# Patient Record
Sex: Female | Born: 1935 | Race: White | Hispanic: No | Smoking: Former smoker
Health system: Southern US, Community
[De-identification: ages and names within clinical notes are randomized; demographics above are authoritative.]

## PROBLEM LIST (undated history)

## (undated) DIAGNOSIS — M1990 Unspecified osteoarthritis, unspecified site: Secondary | ICD-10-CM

## (undated) DIAGNOSIS — E785 Hyperlipidemia, unspecified: Secondary | ICD-10-CM

## (undated) DIAGNOSIS — I10 Essential (primary) hypertension: Secondary | ICD-10-CM

## (undated) DIAGNOSIS — E0500 Thyrotoxicosis with diffuse goiter without thyrotoxic crisis or storm: Secondary | ICD-10-CM

## (undated) DIAGNOSIS — H409 Unspecified glaucoma: Secondary | ICD-10-CM

## (undated) HISTORY — PX: CERVICAL FUSION: SHX112

## (undated) HISTORY — DX: Unspecified glaucoma: H40.9

## (undated) HISTORY — DX: Hyperlipidemia, unspecified: E78.5

## (undated) HISTORY — PX: TUBAL LIGATION: SHX77

## (undated) HISTORY — DX: Essential (primary) hypertension: I10

---

## 2002-08-22 ENCOUNTER — Encounter: Admission: RE | Admit: 2002-08-22 | Discharge: 2002-09-12 | Payer: Self-pay | Admitting: Orthopaedic Surgery

## 2002-11-01 ENCOUNTER — Encounter: Admission: RE | Admit: 2002-11-01 | Discharge: 2002-11-01 | Payer: Self-pay | Admitting: Orthopaedic Surgery

## 2002-12-13 ENCOUNTER — Inpatient Hospital Stay (HOSPITAL_COMMUNITY): Admission: EM | Admit: 2002-12-13 | Discharge: 2002-12-14 | Payer: Self-pay | Admitting: Orthopaedic Surgery

## 2002-12-28 ENCOUNTER — Emergency Department (HOSPITAL_COMMUNITY): Admission: EM | Admit: 2002-12-28 | Discharge: 2002-12-28 | Payer: Self-pay | Admitting: Emergency Medicine

## 2002-12-28 ENCOUNTER — Encounter: Payer: Self-pay | Admitting: Emergency Medicine

## 2003-06-01 ENCOUNTER — Observation Stay (HOSPITAL_COMMUNITY): Admission: EM | Admit: 2003-06-01 | Discharge: 2003-06-02 | Payer: Self-pay

## 2003-06-15 ENCOUNTER — Ambulatory Visit (HOSPITAL_COMMUNITY): Admission: RE | Admit: 2003-06-15 | Discharge: 2003-06-15 | Payer: Self-pay | Admitting: Unknown Physician Specialty

## 2003-06-15 ENCOUNTER — Encounter: Payer: Self-pay | Admitting: Unknown Physician Specialty

## 2003-08-03 ENCOUNTER — Ambulatory Visit (HOSPITAL_COMMUNITY): Admission: RE | Admit: 2003-08-03 | Discharge: 2003-08-03 | Payer: Self-pay | Admitting: Gastroenterology

## 2004-04-24 ENCOUNTER — Encounter
Admission: RE | Admit: 2004-04-24 | Discharge: 2004-07-09 | Payer: Self-pay | Admitting: Physical Medicine and Rehabilitation

## 2006-03-10 ENCOUNTER — Encounter: Admission: RE | Admit: 2006-03-10 | Discharge: 2006-03-22 | Payer: Self-pay | Admitting: Orthopedic Surgery

## 2006-05-27 ENCOUNTER — Observation Stay (HOSPITAL_COMMUNITY): Admission: EM | Admit: 2006-05-27 | Discharge: 2006-05-28 | Payer: Self-pay | Admitting: Emergency Medicine

## 2006-05-31 ENCOUNTER — Encounter: Admission: RE | Admit: 2006-05-31 | Discharge: 2006-05-31 | Payer: Self-pay | Admitting: Neurological Surgery

## 2006-06-07 ENCOUNTER — Encounter: Admission: RE | Admit: 2006-06-07 | Discharge: 2006-06-07 | Payer: Self-pay | Admitting: Neurological Surgery

## 2006-06-17 ENCOUNTER — Inpatient Hospital Stay (HOSPITAL_COMMUNITY): Admission: RE | Admit: 2006-06-17 | Discharge: 2006-06-18 | Payer: Self-pay | Admitting: Neurological Surgery

## 2006-08-02 ENCOUNTER — Encounter: Admission: RE | Admit: 2006-08-02 | Discharge: 2006-08-02 | Payer: Self-pay | Admitting: Neurological Surgery

## 2008-04-16 ENCOUNTER — Encounter: Admission: RE | Admit: 2008-04-16 | Discharge: 2008-05-15 | Payer: Self-pay | Admitting: Orthopedic Surgery

## 2009-06-29 ENCOUNTER — Emergency Department (HOSPITAL_BASED_OUTPATIENT_CLINIC_OR_DEPARTMENT_OTHER): Admission: EM | Admit: 2009-06-29 | Discharge: 2009-06-29 | Payer: Self-pay | Admitting: Emergency Medicine

## 2009-11-19 ENCOUNTER — Emergency Department (HOSPITAL_COMMUNITY): Admission: EM | Admit: 2009-11-19 | Discharge: 2009-11-19 | Payer: Self-pay | Admitting: Emergency Medicine

## 2010-01-23 ENCOUNTER — Encounter: Admission: RE | Admit: 2010-01-23 | Discharge: 2010-04-23 | Payer: Self-pay | Admitting: Orthopaedic Surgery

## 2011-04-06 LAB — URINALYSIS, ROUTINE W REFLEX MICROSCOPIC
Bilirubin Urine: NEGATIVE
Nitrite: NEGATIVE
Protein, ur: NEGATIVE mg/dL
Urobilinogen, UA: 0.2 mg/dL (ref 0.0–1.0)
pH: 6 (ref 5.0–8.0)

## 2011-04-06 LAB — CBC
HCT: 41 % (ref 36.0–46.0)
MCHC: 34.7 g/dL (ref 30.0–36.0)
MCV: 89 fL (ref 78.0–100.0)
RDW: 12.2 % (ref 11.5–15.5)
WBC: 11.6 10*3/uL — ABNORMAL HIGH (ref 4.0–10.5)

## 2011-04-06 LAB — COMPREHENSIVE METABOLIC PANEL
ALT: 15 U/L (ref 0–35)
AST: 28 U/L (ref 0–37)
Alkaline Phosphatase: 83 U/L (ref 39–117)
CO2: 24 mEq/L (ref 19–32)
Calcium: 9.4 mg/dL (ref 8.4–10.5)
GFR calc Af Amer: >60 mL/min (ref >60)
GFR calc non Af Amer: >60 mL/min (ref >60)

## 2011-04-06 LAB — DIFFERENTIAL
Basophils Absolute: 0 10*3/uL (ref 0.0–0.1)
Basophils Relative: 0 % (ref 0–1)
Lymphs Abs: 1.8 10*3/uL (ref 0.7–4.0)
Neutro Abs: 8.9 10*3/uL — ABNORMAL HIGH (ref 1.7–7.7)

## 2011-04-06 LAB — URINE MICROSCOPIC-ADD ON

## 2011-04-06 LAB — POCT CARDIAC MARKERS
CKMB, poc: 2 ng/mL (ref 1.0–8.0)
Troponin i, poc: <0.05 ng/mL (ref 0.00–0.09)
Troponin i, poc: <0.05 ng/mL (ref 0.00–0.09)

## 2011-05-15 NOTE — Cardiovascular Report (Signed)
NAME:  Rita Wells, Rita Wells                        ACCOUNT NO.:  [id number]   MEDICAL RECORD NO.:  [id number]                   PATIENT TYPE:  INP   LOCATION:  6524                                 FACILITY:  [facility]   PHYSICIAN:  Mohan N. Terrence Dupont, M.D.              DATE OF BIRTH:  1933/06/16   DATE OF PROCEDURE:  06/01/2003  DATE OF DISCHARGE:  06/02/2003                              CARDIAC CATHETERIZATION   PROCEDURES PERFORMED:  1. Left cardiac catheterization.  2. Selective left and right coronary angiography.  3. Left ventriculography via right groin using Judkins technique.   CARDIOLOGISTAllegra Lai Terrence Dupont, M.D.   INDICATIONS FOR THE PROCEDURE:  Rita Wells is a 77 year old white female  with a past medical history significant for hypertension, history of tobacco  abuse, degenerative joint disease, and strong family history of coronary  artery disease.  She came to the ER complaining of retrosternal chest  pressure and tightness associated with left shoulder pain, diaphoresis,  shortness of breath and palpitations off and on.  The patient received two  sublingual nitro in the ER with relief of chest tightness.  Denies any  lightheadedness, syncope, nausea, PND, orthopnea, and leg swelling; also  gives history of exertional dyspnea, but denies exertional chest pain.  Denies cough or urgency.  Denies urinary complaints.  Denies relation of  chest pain to food or eating.  The patient was admitted to the telemetry  unit and was ruled out for myocardial infarction.  Admission EKG showed  normal sinus rhythm with poor R-wave progression in V1 to V3.  Repeat EKG  done showed sinus bradycardia with biphasic P waves in the inferolateral  leads.   I discussed with patient and her family regarding the various options of  treatment, i.e. noninvasive stress testing and Cath, as well as PTCA and  stenting and its risks, i.e. MI, stroke, need for emergency CABG, risks of  restenosis, local  vascular complications, etc.  Due to multiple risk factors  and minor EKG changes the family and patient consented for emergency  procedure.   DESCRIPTION OF PROCEDURE:  After obtaining the informed consent the patient  was brought to the cath lab and was placed on the fluoroscopy table.  The  right groin was prepped and draped in the usual fashion.  Two percent  Xylocaine was used for local anesthesia in the right groin.  With the help  of a thin-walled needle a 6 French arterial sheath was placed.  The sheath  was aspirated and flushed.  Next, a 2 French left Judkins catheter was  advanced over the wire under fluoroscopic guidance up to the ascending  aorta.  The wire was pulled out.  The catheter was aspirated and connected  to the manifold.  The catheter was further advanced and engaged into the  left coronary ostium.  Multiple views of the left system were taken.  Next,  the catheter  was disengaged and was pulled out over the wire and was  replaced with a 6 French right Judkins catheter, which was advanced over the  wire under fluoroscopic guidance up to the ascending aorta.  Wire was pulled  out, the catheter was aspirated and connected to the manifold.  The catheter  was further advanced and engaged into the right coronary ostium.  Multiple  views of the right system were taken.  Next, the catheter was disengaged and  was pulled out over the wire, and was replaced with a 6 French pigtail  catheter, which was advanced over the wire under fluoroscopic guidance up to  the ascending aorta.  The wire was pulled out, the catheter was aspirated  and connected to the manifold.  The catheter was further advanced across the  aortic valve into the LV.  LV pressures were recorded.  Next left  ventriculography was done in the 30-degree RAO position.  Post angiographic  pressures were recorded from the LV and then pullback pressures were  recorded from the aorta.  There was no gradient across the  aortic valve.  Next, the pigtail catheter was pulled out over the wire, and the sheaths  were aspirated and flushed.   FINDINGS:  LV showed good LV systolic function.  EF of 55-60%.   Left main was patent.   LAD has greater than 15% proximal and mid stenosis.  Diagonal-1 has 20%  ostial stenosis.  Diagonal-2 has 10-15% proximal stenosis.   Left circumflex has 10-20% proximal stenosis and then tapers down into the  AV groove after giving off a large OM-2.  OM-1 was very, very small, less  than 0.5 mm.  OM-2 was large, which was patent.   RCA has 10-20% proximal stenosis.   Arteriotomy was closed with Perclose without any complications.   The patient tolerated the procedure well and was transferred to the recovery  room in stable condition.                                                 Allegra Lai. Terrence Dupont, M.D.    MNH/MEDQ  D:  06/02/2003  T:  06/02/2003  Job:  [id number]   cc:   Catheterization Laboratory

## 2011-05-15 NOTE — Op Note (Signed)
NAMEVANISHA, SHOLL NO.:  [id number]   MEDICAL RECORD NO.:  [id number]          PATIENT TYPE:  INP   LOCATION:  3172                         FACILITY:  [facility]   PHYSICIAN:  Eustace Moore, MD     DATE OF BIRTH:  Oct 04, 1933   DATE OF PROCEDURE:  06/17/2006  DATE OF DISCHARGE:                                 OPERATIVE REPORT   PREOPERATIVE DIAGNOSIS:  Right chronic subdural hematoma with left arm  numbness.   POSTOPERATIVE DIAGNOSIS:  Right chronic subdural hematoma with left arm  numbness.   PROCEDURE:  Right parietal bur holes for evacuation of chronic subdural  hematoma.   SURGEON:  Eustace Moore, MD.   ASSISTANT:  Faythe Ghee, MD.   ANESTHESIA:  General endotracheal.   COMPLICATIONS:  None apparent.   INDICATIONS FOR PROCEDURE:  Ms. Fasel is a 77 year old female whom I saw  in the emergency department several weeks ago with left arm numbness.  She  had a CT scan which showed a right chronic subdural hematoma.  She had a  history of a previous fall about 3 months prior to that.  She had an MRI  during that admission which showed a moderate-sized right chronic subdural  hematoma with a very small left subdural hematoma which was not visible on  CT scanning.  She was placed on Dilantin because of her continued episodic  symptoms which potentially could have been from cortical irritation or  seizure activity.  She was discharged to home.  She was followed up in the  office for several weeks with serial head CTs, which showed no resolution of  the chronic subdural hematoma, and she had continued symptoms.  Therefore,  we recommended bur holes for evacuation of the subdural hematoma.  She  understood the risks, the benefits, and expected outcome of the procedure.  She also understood that this may not adequately drain the subdural  hematoma, requiring further surgery in the future in a staged fashion.   DESCRIPTION OF PROCEDURE:  The patient was  taken to the operating room, and  after induction of adequate generalized endotracheal anesthesia, she was  placed in a supine position on the operating room table.  Her right parietal  region was shaved in two small areas and then prepped with DuraPrep and then  draped in the usual sterile fashion; 10 mL of local anesthesia was injected,  and 2 small linear incisions were made in the right parietal region.  The  high-speed drill was used to drill two small bur holes, one at each  incision.  At the posterior bur hole, I was able to coagulate the dura, open  the dura, and found a large, thickened, chronic subdural hematoma membrane.  This was opened with bipolar cautery, and then this revealed a similar  membrane just under this.  This was opened, with release of a significant  amount of motor-oil-like fluid under significant pressure.  The brain  immediately came to the surface.  I was able to use a small irrigator and a  #3 Technical brewer to push the  brain away gently and irrigate around the  edges and get some more subdural hematoma fluid out.  However, the brain was  again full, and there was no room to place a subdural drain without risk of  it entering the cortical surface.  Therefore, I made a small bur hole in the  right frontoparietal region and opened the dura.  I found a small membrane  but no significant subdural hematoma fluid.  Once I had irrigated the  posterior bur hole with copious amounts of saline solution until the  irrigant was clear, I lined the 2 bur holes with Gelfoam and then closed the  galea with interrupted 2-0 Vicryl and  closed the skin with staples.  The drapes were removed.  Sterile dressings  were applied.  The patient was awakened from general anesthesia and  transferred to the recovery room in stable condition.  At the end of the  procedure, all sponge, needle, and instrument counts were correct.      Eustace Moore, MD  Electronically  Signed     DSJ/MEDQ  D:  06/17/2006  T:  06/17/2006  Job:  [id number]

## 2011-05-15 NOTE — H&P (Signed)
NAMEJENIAH, Rita Wells NO.:  [id number]   MEDICAL RECORD NO.:  [id number]          PATIENT TYPE:  OBV   LOCATION:  3019                         FACILITY:  [facility]   PHYSICIAN:  Eustace Moore, MD     DATE OF BIRTH:  February 17, 1933   DATE OF ADMISSION:  05/27/2006  DATE OF DISCHARGE:                                HISTORY & PHYSICAL   CHIEF COMPLAINT:  Left-sided numbness.   HISTORY OF PRESENT ILLNESS:  Ms. Follett is a 77 year old white female seen  in neurosurgical consultation regarding a right subacute subdural hematoma.  The patient states that she fell in March, striking the back of her head.  She had no loss of consciousness.  She did not see a physician regarding his  fall.  She states that over the last two days she has had some numbness  which comes and goes in her left arm, side and leg, without facial  involvement.  She denies any significant weakness, falls or change in gait.  She denies any seizure activity or any change in her sensorium.  She is  right-handed.  She denies any diplopia or dysphagia or visual changes.  She  came to the emergency department with these complaints, and a head CT  revealed a small right subacute subdural hematoma without significant mass  effect or shift, and neurosurgical evaluation was requested.   PAST MEDICAL HISTORY:  1.  A three-level ACDF with plating by Dr. Rodell Perna in 2003.  2.  Hyperlipidemia.  3.  Hypertension.   MEDICATIONS:  1.  Zocor.  2.  Altace.  3.  Atenolol.   ALLERGIES:  NO KNOWN DRUG ALLERGIES.   SOCIAL HISTORY:  Has a remote history of tobacco use.  No alcohol use.   PHYSICAL EXAMINATION:  VITAL SIGNS:  Temperature 98.3, pulse 55,  respirations 18, blood pressure 149/72.  GENERAL:  A pleasant, cooperative white female in no acute distress.  HEENT: Normocephalic, atraumatic.  Extraocular movements are intact.  Visual  fields are full to __________ confrontation.  No facial asymmetry.  Tongue  protrudes in midline.  NEUROLOGIC:  She has good shoulder shrug.  Her strength is 5/5 throughout  with good muscle tone and good muscle bulk.  There is no pronator drift.  Sensation is grossly intact throughout.  Reflexes are hypoactive.  Gait is  not tested.   IMAGING STUDIES:  CT scan of the cervical spine shows a three-level ACDF  with plating without significant complications.  She has a CT scan of the  brain which I have reviewed, as well as report, which shows a small right-  sided subacute subdural hematoma with some interhemispheric extension  anteriorly.  There seems to be a small focal hematoma superiorly near the  motor strip.  There seems to be no significant mass effect or shift.   ASSESSMENT/PLAN:  She has a small right subacute subdural hematoma which is  likely symptomatic and causing some brief left-sided numbness.  It does not  seem to be seizure-related, and at this point I do not feel that we should  start her on  prophylactic anticonvulsants.  I would like to get an MRI of  the brain to better characterize the subdural hematoma, its size and its  location specifically.  We will admit her for observation to the regular  floor and offer further treatment recommendations once the MRI is completed.  These things have been discussed with the patient and her daughter in  detail, and they have expressed understanding.      Eustace Moore, MD  Electronically Signed     DSJ/MEDQ  D:  05/27/2006  T:  05/27/2006  Job:  [id number]

## 2011-05-15 NOTE — Discharge Summary (Signed)
NAME:  Rita Wells, Rita Wells                        ACCOUNT NO.:  [id number]   MEDICAL RECORD NO.:  [id number]                   Rita Wells TYPE:  INP   LOCATION:  6524                                 FACILITY:  [facility]   PHYSICIAN:  Mohan N. Terrence Dupont, M.D.              DATE OF BIRTH:  1933-01-26   DATE OF ADMISSION:  06/01/2003  DATE OF DISCHARGE:  06/02/2003                                 DISCHARGE SUMMARY   ADMISSION DIAGNOSES:  1. New-onset angina, rule out myocardial infarction.  2. New-onset hypertension.  3. Elevated blood sugar, rule out diabetes mellitus.  4. Hypercholesterolemia.  5. History of tobacco use.  6. Positive family history of coronary artery disease.   DISCHARGE DIAGNOSES:  1. Mild coronary artery disease.  2. Ventricular bigeminy, asymptomatic.  3. Hypertension.  4. Hypercholesterolemia.  5. History of tobacco abuse.  6. Positive family history of coronary artery disease.  7. Status post marked sinus bradycardia secondary to beta blockers.  8. Musculoskeletal pain.   DISCHARGE MEDICATIONS:  1. Toprol XL 25 mg one tablet daily.  2. Enteric-coated aspirin 81 mg one tablet daily.  3. Lipitor 20 mg one tablet daily.  4. Protonix 40 mg one tablet daily.  5. Celebrex 200 mg one capsule daily with food as needed.   ACTIVITY:  Avoid heavy lifting, pushing, or pulling for 48 hours post  cardiac catheterization, and Perclose instructions have been given.   FOLLOW UP:  Follow up with me in one week.   CONDITION AT DISCHARGE:  Stable.   BRIEF HISTORY AND HOSPITAL COURSE:  Rita Wells is a 77 year old white  female with a past medical history significant for hypercholesterolemia,  history of tobacco abuse, degenerative joint disease, strong family history  of coronary artery disease.  Rita Wells came to the ER complaining of retrosternal  chest pressure/tightness associated with left shoulder pain, diaphoresis,  and shortness of breath and palpitations off and on.  Rita Wells  received two  sublingual nitro in the ER with relief of chest tightness.  Rita Wells denies  any lightheadedness or syncope.  No history of PND, orthopnea, leg swelling.  Also, there is a history of exertional dyspnea but denies exertional chest  pain.  Denies any cough, fever, or chills.  Denies any urinary complaints.  Denies relation of chest pain to food or breathing.   PAST MEDICAL HISTORY:  As above.   PAST SURGICAL HISTORY:  1. Rita Wells had a cervical fusion in December of 2003.  2. Had a tubal ligation in 1950s.   ALLERGIES:  No known drug allergies.   MEDICATIONS:  1. Rita Wells takes metoprolol 20 mg p.o. daily.  2. Ibuprofen p.r.n.   SOCIAL HISTORY:  Rita Wells is married and has seven children, smoked one-half pack  per day for 20+ years, quit three or four years ago.  No history of alcohol  abuse.   FAMILY HISTORY:  Father died of MI at the age  of 75.  Mother died of MI at  the age of 10.  One son had an MI at the age of 65.  One brother had an MI  at the age of 83.  Another brother had an MI in his 73s.  Two sisters have  had coronary artery bypass grafting, and one sister had ovarian cancer.   PHYSICAL EXAMINATION:  GENERAL:  Rita Wells is alert, awake, oriented times three,  in no acute distress.  VITAL SIGNS:  Blood pressure was 190/92, pulse was 85, regular.  HEENT:  Conjunctivae were pink.  NECK:  Supple.  No JVD.  No bruit.  LUNGS:  Clear to auscultation without rhonchi or rales.  CARDIOVASCULAR EXAM:  S1 and S2 were normal.  There was a soft systolic  murmur at the apex.  There was no rub.  ABDOMEN:  Soft.  Bowel sounds were present.  Nontender.  EXTREMITIES:  There was no clubbing, cyanosis, or edema.   LABORATORY DATA:  EKG showed normal sinus rhythm with poor R-wave  progression in V1 through V3.  Repeat EKG done showed sinus bradycardia with  biphasic T waves in inferolateral leads.  Her other labs, CK was 235, MB of  4.7, relative index 2.0; second set, CK was 173, MB of 3.1,  relative index  1.8.  Three sets of troponin-I were negative, 0.01, 0.01, and 0.01.  Her  sodium was 138, potassium 4.0, chloride 106, her blood sugar was 124, repeat  fasting blood sugar was 100.  Her hemoglobin A1c was 5.4, which was in  normal range.  Her BUN was 17, creatinine 1.1.  Hemoglobin was 13.4,  hematocrit 38.6, white count of 8.9 with no shift to the left.   BRIEF HOSPITAL COURSE:  Rita Wells was admitted to telemetry unit; MI was ruled  out by serial enzymes and EKG.  I discussed with Rita Wells and her daughter  regarding various options of treatment, i.e. noninvasive stress testing  versus invasive heart cath and possible PTCA, stenting, its risks, i.e.,  death, MI, stroke, need for emergency CABG, risk of restenosis, local  vascular complications, etc.  Due to minor biphasic T-wave changes in the  inferolateral leads as compared to prior EKG done earlier today and multiple  risk factors, Rita Wells consented for a PCI.   Rita Wells underwent left cardiac cath which led to left and right coronary  angiography, as per procedure report yesterday, which showed mild coronary  artery disease with good LV systolic function.  Rita Wells had persistent, mild  bradycardia with a heart rate of 35 to 40 range.  Beta blockers were  stopped, and it was monitored overnight.  Rita Wells does have sustained  ventricular bigeminy after stopping the beta blocker, but Rita Wells remained  asymptomatic.  Rita Wells will be restarted on low-dose beta blockers and will  be discharged home on the above medications.  If Rita Wells continues to have  palpitations, we will get event monitor as an outpatient.  Rita Wells also has  been advised to stay away from caffeine, chocolate, and alcohol.                                               Allegra Lai. Terrence Dupont, M.D.    MNH/MEDQ  D:  06/02/2003  T:  06/02/2003  Job:  [id number]

## 2011-08-20 ENCOUNTER — Other Ambulatory Visit (HOSPITAL_COMMUNITY): Payer: Self-pay | Admitting: Endocrinology

## 2011-08-20 DIAGNOSIS — E0590 Thyrotoxicosis, unspecified without thyrotoxic crisis or storm: Secondary | ICD-10-CM

## 2011-09-08 ENCOUNTER — Encounter (HOSPITAL_COMMUNITY)
Admission: RE | Admit: 2011-09-08 | Discharge: 2011-09-08 | Disposition: A | Discharge status: Home / Self Care | Payer: Medicare Other | Source: Ambulatory Visit | Attending: Endocrinology | Admitting: Endocrinology

## 2011-09-08 DIAGNOSIS — E0520 Thyrotoxicosis with toxic multinodular goiter without thyrotoxic crisis or storm: Secondary | ICD-10-CM | POA: Insufficient documentation

## 2011-09-08 DIAGNOSIS — E0590 Thyrotoxicosis, unspecified without thyrotoxic crisis or storm: Secondary | ICD-10-CM

## 2011-09-09 ENCOUNTER — Encounter (HOSPITAL_COMMUNITY)
Admission: RE | Admit: 2011-09-09 | Discharge: 2011-09-09 | Disposition: A | Discharge status: Home / Self Care | Payer: Medicare Other | Source: Ambulatory Visit | Attending: Endocrinology | Admitting: Endocrinology

## 2011-09-09 MED ORDER — SODIUM IODIDE I 131 CAPSULE
7.3000 | Freq: Once | INTRAVENOUS | Status: AC | PRN
  Administered 2011-09-08: 7.3 via ORAL

## 2011-09-09 MED ORDER — SODIUM PERTECHNETATE TC 99M INJECTION
9.1000 | Freq: Once | INTRAVENOUS | Status: AC | PRN
  Administered 2011-09-09: 9 via INTRAVENOUS

## 2011-09-22 ENCOUNTER — Other Ambulatory Visit (HOSPITAL_COMMUNITY): Payer: Self-pay | Admitting: Endocrinology

## 2011-09-22 DIAGNOSIS — E0500 Thyrotoxicosis with diffuse goiter without thyrotoxic crisis or storm: Secondary | ICD-10-CM

## 2011-09-29 ENCOUNTER — Encounter (HOSPITAL_COMMUNITY): Payer: Self-pay

## 2011-09-29 ENCOUNTER — Encounter (HOSPITAL_COMMUNITY)
Admission: RE | Admit: 2011-09-29 | Discharge: 2011-09-29 | Disposition: A | Discharge status: Home / Self Care | Payer: Medicare Other | Source: Ambulatory Visit | Attending: Endocrinology | Admitting: Endocrinology

## 2011-09-29 DIAGNOSIS — E0520 Thyrotoxicosis with toxic multinodular goiter without thyrotoxic crisis or storm: Secondary | ICD-10-CM | POA: Insufficient documentation

## 2011-09-29 DIAGNOSIS — E0500 Thyrotoxicosis with diffuse goiter without thyrotoxic crisis or storm: Secondary | ICD-10-CM

## 2011-09-29 HISTORY — DX: Thyrotoxicosis with diffuse goiter without thyrotoxic crisis or storm: E05.00

## 2011-09-29 MED ORDER — SODIUM IODIDE I 131 CAPSULE
31.1000 | Freq: Once | INTRAVENOUS | Status: AC | PRN
  Administered 2011-09-29: 31.1 via ORAL

## 2013-03-24 ENCOUNTER — Telehealth: Payer: Self-pay | Admitting: Nurse Practitioner

## 2013-03-24 NOTE — Telephone Encounter (Signed)
[facility] for samples of Bystolic 10mg  and crestor 20mg 

## 2013-03-24 NOTE — Telephone Encounter (Signed)
Gave to lue ann

## 2013-03-24 NOTE — Telephone Encounter (Signed)
Please advise 

## 2013-05-23 ENCOUNTER — Telehealth: Payer: Self-pay | Admitting: Nurse Practitioner

## 2013-06-15 ENCOUNTER — Ambulatory Visit (INDEPENDENT_AMBULATORY_CARE_PROVIDER_SITE_OTHER): Payer: Medicare Other | Admitting: General Practice

## 2013-06-15 ENCOUNTER — Encounter: Payer: Self-pay | Admitting: General Practice

## 2013-06-15 VITALS — BP 159/80 | HR 56 | Temp 97.4°F | Ht 63.0 in | Wt 134.0 lb

## 2013-06-15 DIAGNOSIS — I10 Essential (primary) hypertension: Secondary | ICD-10-CM

## 2013-06-15 DIAGNOSIS — E039 Hypothyroidism, unspecified: Secondary | ICD-10-CM

## 2013-06-15 DIAGNOSIS — E785 Hyperlipidemia, unspecified: Secondary | ICD-10-CM

## 2013-06-15 LAB — COMPLETE METABOLIC PANEL WITH GFR
ALT: 15 U/L (ref 0–35)
AST: 20 U/L (ref 0–37)
Alkaline Phosphatase: 66 U/L (ref 39–117)
BUN: 13 mg/dL (ref 6–23)
CO2: 26 mEq/L (ref 19–32)
GFR, Est African American: 68 mL/min
Glucose, Bld: 83 mg/dL (ref 70–99)
Potassium: 4.5 mEq/L (ref 3.5–5.3)
Total Bilirubin: 0.6 mg/dL (ref 0.3–1.2)

## 2013-06-15 LAB — POCT CBC
Granulocyte percent: 75.5 %G (ref 37–80)
MCH, POC: 29.7 pg (ref 27–31.2)
MPV: 8.4 fL (ref 0–99.8)
POC LYMPH PERCENT: 21.9 %L (ref 10–50)
Platelet Count, POC: 190 10*3/uL (ref 142–424)
RDW, POC: 13.1 %
WBC: 8 10*3/uL (ref 4.6–10.2)

## 2013-06-15 LAB — THYROID PANEL WITH TSH
Free Thyroxine Index: 3.5 (ref 1.0–3.9)
TSH: 0.401 u[IU]/mL (ref 0.350–4.500)

## 2013-06-15 NOTE — Progress Notes (Signed)
  Subjective:    Patient ID: Rita Wells, female    DOB: August 26, 1933, 79 y.o.   MRN: [id number]  HPI Presents today for 3 month follow up. She has hypothyroidism, hypertension, hyperlipidemia, and seasonal allergies. Reports taking medications as prescribed. Denies having any complaints today. Reports caring for husband with prostate cancer. She reports coping well and great family support.      Review of Systems  Constitutional: Negative for fever and chills.  HENT: Negative for ear pain, neck pain and ear discharge.   Eyes: Negative for pain.  Respiratory: Negative for shortness of breath.   Cardiovascular: Negative for chest pain and palpitations.  Gastrointestinal: Negative for abdominal pain and blood in stool.  Genitourinary: Negative for dysuria, hematuria, difficulty urinating and pelvic pain.  Musculoskeletal: Negative for myalgias and back pain.  Neurological: Negative for dizziness, weakness, numbness and headaches.       Objective:   Physical Exam  Constitutional: She is oriented to person, place, and time. She appears well-developed and well-nourished.  HENT:  Head: Normocephalic and atraumatic.  Right Ear: External ear normal.  Left Ear: External ear normal.  Eyes: EOM are normal.  Neck: Normal range of motion. Neck supple.  Cardiovascular: Normal rate, regular rhythm and normal heart sounds.   Pulmonary/Chest: Effort normal and breath sounds normal. No respiratory distress. She exhibits no tenderness.  Abdominal: Soft. Bowel sounds are normal. She exhibits no distension. There is no tenderness.  Musculoskeletal: She exhibits no tenderness.  Neurological: She is alert and oriented to person, place, and time.  Skin: Skin is warm and dry.  Psychiatric: She has a normal mood and affect.          Assessment & Plan:  1. Unspecified hypothyroidism - Thyroid Panel With TSH -Levothyroxine 59mcg daily, 30 minutes before breakdast 2. Other and unspecified  hyperlipidemia - POCT CBC - NMR Lipoprofile with Lipids - Thyroid Panel With TSH - COMPLETE METABOLIC PANEL WITH GFR -Crestor 20mg  daily   3. Essential hypertension, benign -bystolic 10mg  daily -Continue all current medications Labs pending F/u in 3 months Discussed exercise and diet  Patient verbalized understanding Erby Pian, FNP-C

## 2013-06-15 NOTE — Patient Instructions (Signed)
Hyperthyroidism  The thyroid is a large gland located in the lower front part of your neck. The thyroid helps control metabolism. Metabolism is how your body uses food. It controls metabolism with the hormone thyroxine. When the thyroid is overactive, it produces too much hormone. When this happens, these following problems may occur:   · Nervousness  · Heat intolerance  · Weight loss (in spite of increase food intake)  · Diarrhea  · Change in hair or skin texture  · Palpitations (heart skipping or having extra beats)  · Tachycardia (rapid heart rate)  · Loss of menstruation (amenorrhea)  · Shaking of the hands  CAUSES  · Grave's Disease (the immune system attacks the thyroid gland). This is the most common cause.  · Inflammation of the thyroid gland.  · Tumor (usually benign) in the thyroid gland or elsewhere.  · Excessive use of thyroid medications (both prescription and 'natural').  · Excessive ingestion of Iodine.  DIAGNOSIS   To prove hyperthyroidism, your caregiver may do blood tests and ultrasound tests. Sometimes the signs are hidden. It may be necessary for your caregiver to watch this illness with blood tests, either before or after diagnosis and treatment.  TREATMENT  Short-term treatment  There are several treatments to control symptoms. Drugs called beta blockers may give some relief. Drugs that decrease hormone production will provide temporary relief in many people. These measures will usually not give permanent relief.  Definitive therapy  There are treatments available which can be discussed between you and your caregiver which will permanently treat the problem. These treatments range from surgery (removal of the thyroid), to the use of radioactive iodine (destroys the thyroid by radiation), to the use of antithyroid drugs (interfere with hormone synthesis). The first two treatments are permanent and usually successful. They most often require hormone replacement therapy for life. This is because  it is impossible to remove or destroy the exact amount of thyroid required to make a person euthyroid (normal).  HOME CARE INSTRUCTIONS   See your caregiver if the problems you are being treated for get worse. Examples of this would be the problems listed above.  SEEK MEDICAL CARE IF:  Your general condition worsens.  MAKE SURE YOU:   · Understand these instructions.  · Will watch your condition.  · Will get help right away if you are not doing well or get worse.  Document Released: 12/14/2005 Document Revised: 03/07/2012 Document Reviewed: 04/27/2007  [employer]® Patient Information ©2014 [employer].

## 2013-06-16 LAB — NMR LIPOPROFILE WITH LIPIDS
Cholesterol, Total: 185 mg/dL (ref <200)
HDL Particle Number: 44 umol/L (ref >=30.5)
HDL Size: 9.1 nm — ABNORMAL LOW (ref >=9.2)
HDL-C: 55 mg/dL (ref >=40)
LDL (calc): 97 mg/dL (ref <100)
LDL Particle Number: 1496 nmol/L — ABNORMAL HIGH (ref <1000)
LDL Size: 20.1 nm — ABNORMAL LOW (ref >20.5)
LP-IR Score: 51 — ABNORMAL HIGH (ref <=45)
Large HDL-P: 6.1 umol/L (ref >=4.8)
Large VLDL-P: 3.3 nmol/L — ABNORMAL HIGH (ref <=2.7)
Small LDL Particle Number: 1001 nmol/L — ABNORMAL HIGH (ref <=527)
Triglycerides: 164 mg/dL — ABNORMAL HIGH (ref <150)
VLDL Size: 46 nm (ref <=46.6)

## 2013-07-12 ENCOUNTER — Telehealth: Payer: Self-pay | Admitting: General Practice

## 2013-07-13 NOTE — Telephone Encounter (Signed)
Samples up front. Patient notified 

## 2013-07-27 ENCOUNTER — Other Ambulatory Visit: Payer: Self-pay | Admitting: *Deleted

## 2013-07-27 MED ORDER — PANTOPRAZOLE SODIUM 40 MG PO TBEC: 40.0000 mg | DELAYED_RELEASE_TABLET | Freq: Every day | ORAL | Status: DC

## 2013-07-27 NOTE — Telephone Encounter (Signed)
Patient just seen in office on 6-19. Received rx refill request for this today. Not on med list. Please advise.

## 2013-08-07 ENCOUNTER — Encounter: Payer: Self-pay | Admitting: *Deleted

## 2013-08-14 ENCOUNTER — Telehealth: Payer: Self-pay | Admitting: General Practice

## 2013-08-29 ENCOUNTER — Encounter: Payer: Self-pay | Admitting: *Deleted

## 2013-08-30 ENCOUNTER — Telehealth: Payer: Self-pay | Admitting: General Practice

## 2013-08-31 NOTE — Telephone Encounter (Signed)
Patient received samples on 08-31-13.

## 2013-08-31 NOTE — Telephone Encounter (Signed)
Samples given to pt 

## 2013-09-15 ENCOUNTER — Ambulatory Visit: Payer: Medicare Other | Admitting: General Practice

## 2013-09-19 ENCOUNTER — Ambulatory Visit: Payer: Medicare Other | Admitting: General Practice

## 2013-09-25 ENCOUNTER — Telehealth: Payer: Self-pay | Admitting: General Practice

## 2013-09-28 ENCOUNTER — Ambulatory Visit: Payer: Medicare Other | Admitting: General Practice

## 2013-09-29 ENCOUNTER — Ambulatory Visit (INDEPENDENT_AMBULATORY_CARE_PROVIDER_SITE_OTHER): Payer: Medicare Other | Admitting: General Practice

## 2013-09-29 ENCOUNTER — Encounter: Payer: Self-pay | Admitting: General Practice

## 2013-09-29 VITALS — BP 160/81 | HR 54 | Temp 97.8°F | Ht 63.0 in | Wt 137.0 lb

## 2013-09-29 DIAGNOSIS — E785 Hyperlipidemia, unspecified: Secondary | ICD-10-CM

## 2013-09-29 DIAGNOSIS — K219 Gastro-esophageal reflux disease without esophagitis: Secondary | ICD-10-CM

## 2013-09-29 DIAGNOSIS — J302 Other seasonal allergic rhinitis: Secondary | ICD-10-CM

## 2013-09-29 DIAGNOSIS — J309 Allergic rhinitis, unspecified: Secondary | ICD-10-CM

## 2013-09-29 DIAGNOSIS — I10 Essential (primary) hypertension: Secondary | ICD-10-CM

## 2013-09-29 DIAGNOSIS — Z23 Encounter for immunization: Secondary | ICD-10-CM

## 2013-09-29 MED ORDER — PANTOPRAZOLE SODIUM 40 MG PO TBEC: 40.0000 mg | DELAYED_RELEASE_TABLET | Freq: Every day | ORAL | Status: DC

## 2013-09-29 MED ORDER — ROSUVASTATIN CALCIUM 20 MG PO TABS: 20.0000 mg | ORAL_TABLET | Freq: Every day | ORAL | Status: DC

## 2013-09-29 MED ORDER — NEBIVOLOL HCL 10 MG PO TABS: 10.0000 mg | ORAL_TABLET | Freq: Every day | ORAL | Status: DC

## 2013-09-29 MED ORDER — LORATADINE 10 MG PO TABS: 10.0000 mg | ORAL_TABLET | Freq: Every day | ORAL | Status: DC

## 2013-09-29 NOTE — Progress Notes (Signed)
Subjective:    Patient ID: Rita Wells, female    DOB: 1933/12/28, 79 y.o.   MRN: [id number]  HPI Presents today for 3 month follow up. She has hypothyroidism, hypertension, hyperlipidemia, GERD, and seasonal allergies. Reports hypothyroidism is managed by Dr. Chalmers Wells in [place]. Reports taking medications as prescribed. Denies having any complaints today. Denies taking blood pressure at home.       Review of Systems  Constitutional: Negative for fever and chills.  HENT: Negative for ear pain, neck pain and ear discharge.   Eyes: Negative for pain.  Respiratory: Negative for shortness of breath.   Cardiovascular: Negative for chest pain and palpitations.  Gastrointestinal: Negative for abdominal pain and blood in stool.  Genitourinary: Negative for dysuria, hematuria, difficulty urinating and pelvic pain.  Musculoskeletal: Negative for myalgias and back pain.  Neurological: Negative for dizziness, weakness, numbness and headaches.       Objective:   Physical Exam  Constitutional: She is oriented to person, place, and time. She appears well-developed and well-nourished.  HENT:  Head: Normocephalic and atraumatic.  Right Ear: External ear normal.  Left Ear: External ear normal.  Eyes: EOM are normal.  Neck: Normal range of motion. Neck supple.  Cardiovascular: Normal rate, regular rhythm and normal heart sounds.   Pulmonary/Chest: Effort normal and breath sounds normal. No respiratory distress. She exhibits no tenderness.  Abdominal: Soft. Bowel sounds are normal. She exhibits no distension. There is no tenderness.  Musculoskeletal: She exhibits no tenderness.  Neurological: She is alert and oriented to person, place, and time.  Skin: Skin is warm and dry.  Psychiatric: She has a normal mood and affect.    Results for orders placed in visit on 06/15/13  NMR LIPOPROFILE WITH LIPIDS      Result Value Range   LDL Particle Number [id number] (*) <1000 nmol/L   LDL (calc) 97  <100  mg/dL   HDL-C 55  >=40 mg/dL   Triglycerides 164 (*) <150 mg/dL   Cholesterol, Total 185  <200 mg/dL   HDL Particle Number 44.0  >=30.5 umol/L   Large HDL-P 6.1  >=4.8 umol/L   Large VLDL-P 3.3 (*) <=2.7 nmol/L   Small LDL Particle Number [id number] (*) <=527 nmol/L   LDL Size 20.1 (*) >20.5 nm   HDL Size 9.1 (*) >=9.2 nm   VLDL Size 46.0  <=46.6 nm   LP-IR Score 51 (*) <=45  THYROID PANEL WITH TSH      Result Value Range   T4, Total 11.0  5.0 - 12.5 ug/dL   T3 Uptake 31.8  22.5 - 37.0 %   Free Thyroxine Index 3.5  1.0 - 3.9   TSH 0.401  0.350 - 4.500 uIU/mL  COMPLETE METABOLIC PANEL WITH GFR      Result Value Range   Sodium 140  135 - 145 mEq/L   Potassium 4.5  3.5 - 5.3 mEq/L   Chloride 106  96 - 112 mEq/L   CO2 26  19 - 32 mEq/L   Glucose, Bld 83  70 - 99 mg/dL   BUN 13  6 - 23 mg/dL   Creat 0.94  0.50 - 1.10 mg/dL   Total Bilirubin 0.6  0.3 - 1.2 mg/dL   Alkaline Phosphatase 66  39 - 117 U/L   AST 20  0 - 37 U/L   ALT 15  0 - 35 U/L   Total Protein 7.0  6.0 - 8.3 g/dL   Albumin 4.3  3.5 - 5.2  g/dL   Calcium 9.8  8.4 - 10.5 mg/dL   GFR, Est African American 68     GFR, Est Non African American 59 (*)   POCT CBC      Result Value Range   WBC 8.0  4.6 - 10.2 K/uL   Lymph, poc 1.8  0.6 - 3.4   POC LYMPH PERCENT 21.9  10 - 50 %L   POC Granulocyte 6.0  2 - 6.9   Granulocyte percent 75.5  37 - 80 %G   RBC 5.1  4.04 - 5.48 M/uL   Hemoglobin 15.1  12.2 - 16.2 g/dL   HCT, POC 44.5  37.7 - 47.9 %   MCV 87.8  80 - 97 fL   MCH, POC 29.7  27 - 31.2 pg   MCHC 33.9  31.8 - 35.4 g/dL   RDW, POC 13.1     Platelet Count, POC 190.0  142 - 424 K/uL   MPV 8.4  0 - 99.8 fL         Assessment & Plan:  1. Hypertension  - nebivolol (BYSTOLIC) 10 MG tablet; Take 1 tablet (10 mg total) by mouth daily.  Dispense: 30 tablet; Refill: 3 - CMP14+EGFR  2. Other and unspecified hyperlipidemia  - rosuvastatin (CRESTOR) 20 MG tablet; Take 1 tablet (20 mg total) by mouth daily.  Dispense: 30  tablet; Refill: 3 - Lipid panel  3. GERD (gastroesophageal reflux disease)  - pantoprazole (PROTONIX) 40 MG tablet; Take 1 tablet (40 mg total) by mouth daily.  Dispense: 30 tablet; Refill: 3  4. Seasonal allergies  - loratadine (CLARITIN) 10 MG tablet; Take 1 tablet (10 mg total) by mouth daily.  Dispense: 30 tablet; Refill: 3  5. Need for prophylactic vaccination and inoculation against influenza Continue all current medications Labs pending F/u in 3 months Discussed exercise and diet  Patient verbalized understanding Erby Pian, FNP-C

## 2013-09-29 NOTE — Patient Instructions (Signed)

## 2013-09-30 LAB — CMP14+EGFR
ALT: 11 IU/L (ref 0–32)
Albumin/Globulin Ratio: 2 (ref 1.1–2.5)
Albumin: 4.3 g/dL (ref 3.5–4.8)
Alkaline Phosphatase: 71 IU/L (ref 39–117)
BUN/Creatinine Ratio: 14 (ref 11–26)
BUN: 12 mg/dL (ref 8–27)
Calcium: 9.4 mg/dL (ref 8.6–10.2)
Creatinine, Ser: 0.88 mg/dL (ref 0.57–1.00)
GFR calc non Af Amer: 63 mL/min/{1.73_m2} (ref >59)
Globulin, Total: 2.2 g/dL (ref 1.5–4.5)
Glucose: 79 mg/dL (ref 65–99)
Potassium: 4.7 mmol/L (ref 3.5–5.2)
Sodium: 142 mmol/L (ref 134–144)
Total Protein: 6.5 g/dL (ref 6.0–8.5)

## 2013-09-30 LAB — LIPID PANEL
Cholesterol, Total: 174 mg/dL (ref 100–199)
HDL: 54 mg/dL (ref >39)
LDL Calculated: 88 mg/dL (ref 0–99)
VLDL Cholesterol Cal: 32 mg/dL (ref 5–40)

## 2013-10-11 ENCOUNTER — Telehealth: Payer: Self-pay | Admitting: General Practice

## 2013-10-12 NOTE — Telephone Encounter (Signed)
Aware. 

## 2013-11-06 ENCOUNTER — Telehealth: Payer: Self-pay | Admitting: General Practice

## 2013-11-06 NOTE — Telephone Encounter (Signed)
Aware, samples ready. 

## 2014-01-02 ENCOUNTER — Encounter: Payer: Self-pay | Admitting: General Practice

## 2014-01-02 ENCOUNTER — Ambulatory Visit (INDEPENDENT_AMBULATORY_CARE_PROVIDER_SITE_OTHER): Payer: Medicare HMO | Admitting: General Practice

## 2014-01-02 VITALS — BP 147/79 | HR 44 | Temp 97.7°F | Wt 134.5 lb

## 2014-01-02 DIAGNOSIS — E785 Hyperlipidemia, unspecified: Secondary | ICD-10-CM

## 2014-01-02 DIAGNOSIS — E039 Hypothyroidism, unspecified: Secondary | ICD-10-CM

## 2014-01-02 DIAGNOSIS — I10 Essential (primary) hypertension: Secondary | ICD-10-CM

## 2014-01-02 DIAGNOSIS — G4700 Insomnia, unspecified: Secondary | ICD-10-CM

## 2014-01-02 DIAGNOSIS — K219 Gastro-esophageal reflux disease without esophagitis: Secondary | ICD-10-CM

## 2014-01-02 MED ORDER — ROSUVASTATIN CALCIUM 20 MG PO TABS: 20.0000 mg | ORAL_TABLET | Freq: Every day | ORAL | Status: DC

## 2014-01-02 MED ORDER — PANTOPRAZOLE SODIUM 40 MG PO TBEC: 40.0000 mg | DELAYED_RELEASE_TABLET | Freq: Every day | ORAL | Status: DC

## 2014-01-02 MED ORDER — NEBIVOLOL HCL 10 MG PO TABS: 10.0000 mg | ORAL_TABLET | Freq: Every day | ORAL | Status: DC

## 2014-01-02 MED ORDER — TRAZODONE HCL 50 MG PO TABS: 50.0000 mg | ORAL_TABLET | Freq: Every evening | ORAL | Status: DC | PRN

## 2014-01-02 NOTE — Patient Instructions (Addendum)

## 2014-01-02 NOTE — Progress Notes (Signed)
Subjective:    Patient ID: Rita Wells, female    DOB: [birth year], 80 y.o.   MRN: [id number]  HPI Presents today for 3 month follow up. She has hypothyroidism, hypertension, hyperlipidemia, GERD, and seasonal allergies. Hypothyroidism is managed by Dr. Chalmers Cater in [place]. Reports taking medications as prescribed. Denies taking blood pressure at home. Reports racing thoughts at night and having some insomnia due to husband's illness (cancer), hospice has been called in. She denies taking sleep aids in the past.      Review of Systems  Constitutional: Negative for fever and chills.  Respiratory: Negative for chest tightness and shortness of breath.   Cardiovascular: Negative for chest pain and palpitations.  Neurological: Negative for dizziness, weakness and headaches.  Psychiatric/Behavioral: Positive for sleep disturbance. Negative for suicidal ideas and self-injury.       Objective:   Physical Exam  Constitutional: She is oriented to person, place, and time. She appears well-developed and well-nourished.  HENT:  Head: Normocephalic and atraumatic.  Right Ear: External ear normal.  Left Ear: External ear normal.  Eyes: EOM are normal.  Neck: Normal range of motion. Neck supple.  Cardiovascular: Normal rate, regular rhythm and normal heart sounds.   Pulmonary/Chest: Effort normal and breath sounds normal. No respiratory distress. She exhibits no tenderness.  Abdominal: Soft. Bowel sounds are normal. She exhibits no distension. There is no tenderness.  Musculoskeletal: She exhibits no tenderness.  Neurological: She is alert and oriented to person, place, and time.  Skin: Skin is warm and dry.  Psychiatric: She has a normal mood and affect.          Assessment & Plan:  1. Other and unspecified hyperlipidemia  - Lipid panel - rosuvastatin (CRESTOR) 20 MG tablet; Take 1 tablet (20 mg total) by mouth daily.  Dispense: 30 tablet; Refill: 3  2. Unspecified  hypothyroidism  - Thyroid Panel With TSH  3. Essential hypertension, benign  - CMP14+EGFR  4. GERD (gastroesophageal reflux disease)  - pantoprazole (PROTONIX) 40 MG tablet; Take 1 tablet (40 mg total) by mouth daily.  Dispense: 30 tablet; Refill: 3  5. Hypertension  - nebivolol (BYSTOLIC) 10 MG tablet; Take 1 tablet (10 mg total) by mouth daily.  Dispense: 30 tablet; Refill: 3  6. Insomnia  - traZODone (DESYREL) 50 MG tablet; Take 1 tablet (50 mg total) by mouth at bedtime as needed for sleep.  Dispense: 30 tablet; Refill: 0 -discussed sleep hygiene -Continue all current medications Labs pending F/u in 3 months Discussed benefits of regular exercise and healthy eating Patient verbalized understanding Erby Pian, FNP-C

## 2014-01-03 LAB — LIPID PANEL
Chol/HDL Ratio: 3 ratio units (ref 0.0–4.4)
Cholesterol, Total: 161 mg/dL (ref 100–199)
HDL: 53 mg/dL (ref >39)
LDL CALC: 82 mg/dL (ref 0–99)
TRIGLYCERIDES: 129 mg/dL (ref 0–149)
VLDL Cholesterol Cal: 26 mg/dL (ref 5–40)

## 2014-01-03 LAB — CMP14+EGFR
ALT: 14 IU/L (ref 0–32)
AST: 23 IU/L (ref 0–40)
Albumin/Globulin Ratio: 2.6 — ABNORMAL HIGH (ref 1.1–2.5)
Albumin: 4.5 g/dL (ref 3.5–4.8)
Alkaline Phosphatase: 74 IU/L (ref 39–117)
BUN/Creatinine Ratio: 14 (ref 11–26)
BUN: 13 mg/dL (ref 8–27)
CALCIUM: 9.2 mg/dL (ref 8.6–10.2)
CO2: 23 mmol/L (ref 18–29)
CREATININE: 0.96 mg/dL (ref 0.57–1.00)
Chloride: 102 mmol/L (ref 97–108)
GFR calc Af Amer: 66 mL/min/{1.73_m2} (ref >59)
GFR, EST NON AFRICAN AMERICAN: 57 mL/min/{1.73_m2} — AB (ref >59)
GLOBULIN, TOTAL: 1.7 g/dL (ref 1.5–4.5)
GLUCOSE: 77 mg/dL (ref 65–99)
Potassium: 4.1 mmol/L (ref 3.5–5.2)
SODIUM: 141 mmol/L (ref 134–144)
TOTAL PROTEIN: 6.2 g/dL (ref 6.0–8.5)
Total Bilirubin: 0.5 mg/dL (ref 0.0–1.2)

## 2014-01-03 LAB — THYROID PANEL WITH TSH
Free Thyroxine Index: 3 (ref 1.2–4.9)
T3 Uptake Ratio: 28 % (ref 24–39)
T4, Total: 10.7 ug/dL (ref 4.5–12.0)
TSH: 3.4 u[IU]/mL (ref 0.450–4.500)

## 2014-01-08 ENCOUNTER — Ambulatory Visit: Payer: Medicare Other | Admitting: General Practice

## 2014-01-08 ENCOUNTER — Telehealth: Payer: Self-pay | Admitting: General Practice

## 2014-01-08 NOTE — Telephone Encounter (Signed)
No samples patient aware. 

## 2014-01-10 ENCOUNTER — Other Ambulatory Visit: Payer: Self-pay | Admitting: General Practice

## 2014-01-10 ENCOUNTER — Telehealth: Payer: Self-pay | Admitting: General Practice

## 2014-01-10 DIAGNOSIS — G4700 Insomnia, unspecified: Secondary | ICD-10-CM

## 2014-01-10 MED ORDER — TRAZODONE HCL 50 MG PO TABS: 100.0000 mg | ORAL_TABLET | Freq: Every evening | ORAL | Status: DC | PRN

## 2014-01-10 NOTE — Telephone Encounter (Signed)
May take two (50mg ) tablets, lets give this medication more time to see if effective

## 2014-01-11 NOTE — Telephone Encounter (Signed)
Patient aware.

## 2014-01-22 ENCOUNTER — Other Ambulatory Visit: Payer: Self-pay | Admitting: General Practice

## 2014-01-22 ENCOUNTER — Telehealth: Payer: Self-pay | Admitting: General Practice

## 2014-01-22 DIAGNOSIS — F419 Anxiety disorder, unspecified: Secondary | ICD-10-CM

## 2014-01-22 MED ORDER — ALPRAZOLAM 0.25 MG PO TABS: 0.2500 mg | ORAL_TABLET | Freq: Two times a day (BID) | ORAL | Status: DC | PRN

## 2014-01-22 NOTE — Telephone Encounter (Signed)
Script called in to pharmacy  

## 2014-01-23 NOTE — Telephone Encounter (Signed)
Pt.notified

## 2014-02-06 ENCOUNTER — Telehealth: Payer: Self-pay | Admitting: General Practice

## 2014-02-06 ENCOUNTER — Other Ambulatory Visit: Payer: Self-pay | Admitting: General Practice

## 2014-02-06 DIAGNOSIS — F419 Anxiety disorder, unspecified: Secondary | ICD-10-CM

## 2014-02-06 MED ORDER — ALPRAZOLAM 0.25 MG PO TABS: 0.2500 mg | ORAL_TABLET | Freq: Two times a day (BID) | ORAL | Status: DC | PRN

## 2014-02-06 NOTE — Telephone Encounter (Signed)
Please call into pharmacy

## 2014-02-06 NOTE — Telephone Encounter (Signed)
Xanax called to pharmacy.

## 2014-03-05 ENCOUNTER — Other Ambulatory Visit: Payer: Self-pay | Admitting: General Practice

## 2014-03-05 ENCOUNTER — Telehealth: Payer: Self-pay | Admitting: General Practice

## 2014-03-05 DIAGNOSIS — F419 Anxiety disorder, unspecified: Secondary | ICD-10-CM

## 2014-03-05 MED ORDER — ALPRAZOLAM 0.25 MG PO TABS: 0.2500 mg | ORAL_TABLET | Freq: Two times a day (BID) | ORAL | Status: DC | PRN

## 2014-03-05 NOTE — Telephone Encounter (Signed)
RX called into pharmacy by Mary Sella.

## 2014-03-05 NOTE — Telephone Encounter (Signed)
Script called in to pharmacy  

## 2014-04-02 ENCOUNTER — Ambulatory Visit: Payer: Medicare HMO | Admitting: General Practice

## 2014-04-19 ENCOUNTER — Encounter: Payer: Self-pay | Admitting: General Practice

## 2014-04-19 ENCOUNTER — Ambulatory Visit (INDEPENDENT_AMBULATORY_CARE_PROVIDER_SITE_OTHER): Payer: Medicare HMO | Admitting: General Practice

## 2014-04-19 VITALS — BP 148/88 | HR 70 | Temp 97.8°F | Ht 63.0 in | Wt 144.6 lb

## 2014-04-19 DIAGNOSIS — F411 Generalized anxiety disorder: Secondary | ICD-10-CM

## 2014-04-19 DIAGNOSIS — E785 Hyperlipidemia, unspecified: Secondary | ICD-10-CM

## 2014-04-19 DIAGNOSIS — G4700 Insomnia, unspecified: Secondary | ICD-10-CM

## 2014-04-19 DIAGNOSIS — I10 Essential (primary) hypertension: Secondary | ICD-10-CM | POA: Insufficient documentation

## 2014-04-19 DIAGNOSIS — F419 Anxiety disorder, unspecified: Secondary | ICD-10-CM

## 2014-04-19 MED ORDER — ALPRAZOLAM 0.25 MG PO TABS: 0.2500 mg | ORAL_TABLET | Freq: Two times a day (BID) | ORAL | Status: DC | PRN

## 2014-04-19 NOTE — Patient Instructions (Signed)

## 2014-04-19 NOTE — Progress Notes (Signed)
   Subjective:    Patient ID: Rita Wells, female    DOB: 1933-07-23, 80 y.o.   MRN: [id number]  HPI Presents today for 3 month follow up. History of hypothyroidism, hypertension, hyperlipidemia, GERD, insomnia, anxiety, and seasonal allergies. Hypothyroidism is managed by Dr. Chalmers Cater in [place]. Reports taking medications as prescribed. Xanax is effective in managing anxiety. Denies having racing thoughts. Now able to sleep at night. Denies taking blood pressure at home.       Review of Systems  Constitutional: Negative for fever and chills.  Respiratory: Negative for chest tightness and shortness of breath.   Cardiovascular: Negative for chest pain and palpitations.  Gastrointestinal: Negative for vomiting, abdominal pain, diarrhea, constipation and blood in stool.  Neurological: Negative for dizziness, weakness and headaches.  Psychiatric/Behavioral: Negative for suicidal ideas, sleep disturbance and self-injury. The patient is not nervous/anxious.        Objective:   Physical Exam  Constitutional: She is oriented to person, place, and time. She appears well-developed and well-nourished.  HENT:  Head: Normocephalic and atraumatic.  Right Ear: External ear normal.  Left Ear: External ear normal.  Eyes: EOM are normal.  Neck: Normal range of motion. Neck supple.  Cardiovascular: Normal rate, regular rhythm and normal heart sounds.   Pulmonary/Chest: Effort normal and breath sounds normal. No respiratory distress. She exhibits no tenderness.  Abdominal: Soft. Bowel sounds are normal. She exhibits no distension. There is no tenderness.  Musculoskeletal: She exhibits no tenderness.  Neurological: She is alert and oriented to person, place, and time.  Skin: Skin is warm and dry.  Psychiatric: She has a normal mood and affect.          Assessment & Plan:  1. Anxiety  - ALPRAZolam (XANAX) 0.25 MG tablet; Take 1 tablet (0.25 mg total) by mouth 2 (two) times daily as needed  for anxiety.  Dispense: 60 tablet; Refill: 2  2. Hyperlipidemia  - Lipid panel  3. Hypertension  - CMP14+EGFR  4. Insomnia -Continue all current medications Labs pending F/u in 3 months Discussed benefits of regular exercise and healthy eating Patient verbalized understanding Erby Pian, FNP-C

## 2014-04-20 LAB — LIPID PANEL
CHOLESTEROL TOTAL: 169 mg/dL (ref 100–199)
Chol/HDL Ratio: 2.9 ratio units (ref 0.0–4.4)
HDL: 59 mg/dL (ref >39)
LDL Calculated: 86 mg/dL (ref 0–99)
Triglycerides: 118 mg/dL (ref 0–149)
VLDL CHOLESTEROL CAL: 24 mg/dL (ref 5–40)

## 2014-04-20 LAB — CMP14+EGFR
ALT: 14 IU/L (ref 0–32)
AST: 19 IU/L (ref 0–40)
Albumin/Globulin Ratio: 2 (ref 1.1–2.5)
Albumin: 4.3 g/dL (ref 3.5–4.8)
Alkaline Phosphatase: 75 IU/L (ref 39–117)
BUN/Creatinine Ratio: 16 (ref 11–26)
BUN: 16 mg/dL (ref 8–27)
CALCIUM: 9.5 mg/dL (ref 8.7–10.3)
CHLORIDE: 102 mmol/L (ref 97–108)
CO2: 26 mmol/L (ref 18–29)
Creatinine, Ser: 1 mg/dL (ref 0.57–1.00)
GFR calc Af Amer: 62 mL/min/{1.73_m2} (ref >59)
GFR calc non Af Amer: 54 mL/min/{1.73_m2} — ABNORMAL LOW (ref >59)
GLUCOSE: 89 mg/dL (ref 65–99)
Globulin, Total: 2.2 g/dL (ref 1.5–4.5)
POTASSIUM: 4.5 mmol/L (ref 3.5–5.2)
SODIUM: 141 mmol/L (ref 134–144)
TOTAL PROTEIN: 6.5 g/dL (ref 6.0–8.5)
Total Bilirubin: 0.4 mg/dL (ref 0.0–1.2)

## 2014-04-24 ENCOUNTER — Ambulatory Visit: Payer: Medicare HMO | Admitting: General Practice

## 2014-05-28 ENCOUNTER — Telehealth: Payer: Self-pay | Admitting: Family

## 2014-05-28 ENCOUNTER — Ambulatory Visit: Payer: Self-pay | Admitting: Family Medicine

## 2014-05-28 MED ORDER — ROSUVASTATIN CALCIUM 20 MG PO TABS: 20.0000 mg | ORAL_TABLET | Freq: Every day | ORAL | Status: DC

## 2014-05-28 NOTE — Telephone Encounter (Signed)
Given to lue ann

## 2014-06-21 ENCOUNTER — Telehealth: Payer: Self-pay | Admitting: Family Medicine

## 2014-06-21 DIAGNOSIS — I10 Essential (primary) hypertension: Secondary | ICD-10-CM

## 2014-06-22 MED ORDER — NEBIVOLOL HCL 10 MG PO TABS: 10.0000 mg | ORAL_TABLET | Freq: Every day | ORAL | Status: DC

## 2014-06-22 NOTE — Telephone Encounter (Signed)
Message left bystolic up front but we have no crestor

## 2014-06-26 ENCOUNTER — Telehealth: Payer: Self-pay | Admitting: Family Medicine

## 2014-06-26 DIAGNOSIS — E785 Hyperlipidemia, unspecified: Secondary | ICD-10-CM

## 2014-06-26 MED ORDER — ROSUVASTATIN CALCIUM 20 MG PO TABS: 20.0000 mg | ORAL_TABLET | Freq: Every day | ORAL | Status: DC

## 2014-06-26 NOTE — Telephone Encounter (Signed)
Samples to front for pt pick up

## 2014-07-10 ENCOUNTER — Telehealth: Payer: Self-pay | Admitting: Family Medicine

## 2014-07-11 ENCOUNTER — Other Ambulatory Visit: Payer: Self-pay | Admitting: *Deleted

## 2014-07-11 DIAGNOSIS — F419 Anxiety disorder, unspecified: Secondary | ICD-10-CM

## 2014-07-11 MED ORDER — ALPRAZOLAM 0.25 MG PO TABS: 0.2500 mg | ORAL_TABLET | Freq: Two times a day (BID) | ORAL | Status: DC | PRN

## 2014-07-11 NOTE — Telephone Encounter (Signed)
Called into pharmacy

## 2014-07-11 NOTE — Telephone Encounter (Signed)
Patient last seen in office on 04-19-14 by Mae. Rx last filled on 06-13-14 for #60. Please advise. If approved please route to pool B so nurse can call in to pharmacy

## 2014-07-11 NOTE — Telephone Encounter (Signed)
Please call in xanax with 1 refills 

## 2014-07-23 ENCOUNTER — Telehealth: Payer: Self-pay | Admitting: Family Medicine

## 2014-07-23 DIAGNOSIS — I10 Essential (primary) hypertension: Secondary | ICD-10-CM

## 2014-07-23 MED ORDER — NEBIVOLOL HCL 10 MG PO TABS: 10.0000 mg | ORAL_TABLET | Freq: Every day | ORAL | Status: DC

## 2014-07-23 NOTE — Telephone Encounter (Signed)
Up front patient aware 

## 2014-08-24 ENCOUNTER — Telehealth: Payer: Self-pay | Admitting: Nurse Practitioner

## 2014-08-24 DIAGNOSIS — E785 Hyperlipidemia, unspecified: Secondary | ICD-10-CM

## 2014-08-24 MED ORDER — ROSUVASTATIN CALCIUM 20 MG PO TABS: 20.0000 mg | ORAL_TABLET | Freq: Every day | ORAL | Status: DC

## 2014-08-24 NOTE — Telephone Encounter (Signed)
Samples given to daughter

## 2014-08-27 ENCOUNTER — Telehealth: Payer: Self-pay | Admitting: Family Medicine

## 2014-08-28 NOTE — Telephone Encounter (Signed)
Aware of samples #14 given to pt.

## 2014-09-11 ENCOUNTER — Ambulatory Visit (INDEPENDENT_AMBULATORY_CARE_PROVIDER_SITE_OTHER): Payer: Commercial Managed Care - HMO

## 2014-09-11 ENCOUNTER — Ambulatory Visit (INDEPENDENT_AMBULATORY_CARE_PROVIDER_SITE_OTHER): Payer: Commercial Managed Care - HMO | Admitting: Family Medicine

## 2014-09-11 ENCOUNTER — Encounter: Payer: Self-pay | Admitting: Family Medicine

## 2014-09-11 VITALS — BP 164/86 | HR 73 | Temp 97.7°F | Ht 63.0 in | Wt 151.0 lb

## 2014-09-11 DIAGNOSIS — M5441 Lumbago with sciatica, right side: Secondary | ICD-10-CM

## 2014-09-11 DIAGNOSIS — M5430 Sciatica, unspecified side: Secondary | ICD-10-CM

## 2014-09-11 NOTE — Progress Notes (Signed)
   Subjective:    Patient ID: Rita Wells, female    DOB: 1933/09/02, 80 y.o.   MRN: [id number]  HPI 80 year old female who presents today with a three-month history of back pain which radiates into her right hip and down the anterior aspect of her right leg. She has a history of a bulging disc seen on MRI. At that time she had some physical therapy and her symptoms resolved. There is been no recent fall or trauma. Pain is better lying down but is aggravated by activity. It seemed to begin when she lifted a heavy well roof.    Review of Systems  Musculoskeletal: Positive for back pain.  All other systems reviewed and are negative.      Objective:   Physical Exam  Constitutional: She is oriented to person, place, and time. She appears well-developed and well-nourished.  Eyes: Conjunctivae and EOM are normal.  Neck: Normal range of motion. Neck supple.  Cardiovascular: Normal rate, regular rhythm and normal heart sounds.   Pulmonary/Chest: Effort normal and breath sounds normal.  Abdominal: Soft. Bowel sounds are normal.  Musculoskeletal: Normal range of motion.  Back: decreased ROM SLR neg  DTR symmetric  Neurological: She is alert and oriented to person, place, and time. She has normal reflexes.  Skin: Skin is warm and dry.  Psychiatric: She has a normal mood and affect. Her behavior is normal. Thought content normal.    BP 164/86  Pulse 73  Temp(Src) 97.7 F (36.5 C) (Oral)  Ht 5\' 3"  (1.6 m)  Wt 151 lb (68.493 kg)  BMI 26.76 kg/m2      Assessment & Plan:  1. Right-sided low back pain with right-sided sciatica Xray shows some spurring but disc spaces are maintained Will try PT again - DG Lumbar Spine 2-3 Views; Future  Wardell Honour MD - Ambulatory referral to Physical Therapy

## 2014-09-11 NOTE — Patient Instructions (Signed)
Back Pain, Adult Low back pain is very common. About 1 in 5 people have back pain.The cause of low back pain is rarely dangerous. The pain often gets better over time.About half of people with a sudden onset of back pain feel better in just 2 weeks. About 8 in 10 people feel better by 6 weeks.  CAUSES Some common causes of back pain include:  Strain of the muscles or ligaments supporting the spine.  Wear and tear (degeneration) of the spinal discs.  Arthritis.  Direct injury to the back. DIAGNOSIS Most of the time, the direct cause of low back pain is not known.However, back pain can be treated effectively even when the exact cause of the pain is unknown.Answering your caregiver's questions about your overall health and symptoms is one of the most accurate ways to make sure the cause of your pain is not dangerous. If your caregiver needs more information, he or she may order lab work or imaging tests (X-rays or MRIs).However, even if imaging tests show changes in your back, this usually does not require surgery. HOME CARE INSTRUCTIONS For many people, back pain returns.Since low back pain is rarely dangerous, it is often a condition that people can learn to manageon their own.   Remain active. It is stressful on the back to sit or stand in one place. Do not sit, drive, or stand in one place for more than 30 minutes at a time. Take short walks on level surfaces as soon as pain allows.Try to increase the length of time you walk each day.  Do not stay in bed.Resting more than 1 or 2 days can delay your recovery.  Do not avoid exercise or work.Your body is made to move.It is not dangerous to be active, even though your back may hurt.Your back will likely heal faster if you return to being active before your pain is gone.  Pay attention to your body when you bend and lift. Many people have less discomfortwhen lifting if they bend their knees, keep the load close to their bodies,and  avoid twisting. Often, the most comfortable positions are those that put less stress on your recovering back.  Find a comfortable position to sleep. Use a firm mattress and lie on your side with your knees slightly bent. If you lie on your back, put a pillow under your knees.  Only take over-the-counter or prescription medicines as directed by your caregiver. Over-the-counter medicines to reduce pain and inflammation are often the most helpful.Your caregiver may prescribe muscle relaxant drugs.These medicines help dull your pain so you can more quickly return to your normal activities and healthy exercise.  Put ice on the injured area.  Put ice in a plastic bag.  Place a towel between your skin and the bag.  Leave the ice on for 15-20 minutes, 03-04 times a day for the first 2 to 3 days. After that, ice and heat may be alternated to reduce pain and spasms.  Ask your caregiver about trying back exercises and gentle massage. This may be of some benefit.  Avoid feeling anxious or stressed.Stress increases muscle tension and can worsen back pain.It is important to recognize when you are anxious or stressed and learn ways to manage it.Exercise is a great option. SEEK MEDICAL CARE IF:  You have pain that is not relieved with rest or medicine.  You have pain that does not improve in 1 week.  You have new symptoms.  You are generally not feeling well. SEEK   IMMEDIATE MEDICAL CARE IF:   You have pain that radiates from your back into your legs.  You develop new bowel or bladder control problems.  You have unusual weakness or numbness in your arms or legs.  You develop nausea or vomiting.  You develop abdominal pain.  You feel faint. Document Released: 12/14/2005 Document Revised: 06/14/2012 Document Reviewed: 04/17/2014 Exit[employer] Patient Information 2015 [employer]. This information is not intended to replace advice given to you by your health care provider. Make sure you  discuss any questions you have with your health care provider.  

## 2014-09-11 NOTE — Progress Notes (Signed)
   Subjective:    Patient ID: Rita Wells, female    DOB: [birth year], 80 y.o.   MRN: [id number]  HPI   Patient Active Problem List   Diagnosis Date Noted  . Hypertension 04/19/2014  . Hyperlipidemia 04/19/2014  . Generalized anxiety disorder 04/19/2014  . Insomnia 04/19/2014   Outpatient Encounter Prescriptions as of 09/11/2014  Medication Sig  . ALPRAZolam (XANAX) 0.25 MG tablet Take 1 tablet (0.25 mg total) by mouth 2 (two) times daily as needed for anxiety.  Marland Kitchen levothyroxine (SYNTHROID, LEVOTHROID) 75 MCG tablet Take 75 mcg by mouth daily before breakfast.  . loratadine (CLARITIN) 10 MG tablet Take 1 tablet (10 mg total) by mouth daily.  . nebivolol (BYSTOLIC) 10 MG tablet Take 1 tablet (10 mg total) by mouth daily.  . pantoprazole (PROTONIX) 40 MG tablet Take 1 tablet (40 mg total) by mouth daily.  . rosuvastatin (CRESTOR) 20 MG tablet Take 1 tablet (20 mg total) by mouth daily.  . traZODone (DESYREL) 50 MG tablet Take 2 tablets (100 mg total) by mouth at bedtime as needed for sleep.     Review of Systems     Objective:   Physical Exam BP 164/86  Pulse 73  Temp(Src) 97.7 F (36.5 C) (Oral)  Ht 5\' 3"  (1.6 m)  Wt 151 lb (68.493 kg)  BMI 26.76 kg/m2        Assessment & Plan:

## 2014-09-17 ENCOUNTER — Ambulatory Visit: Payer: Medicare HMO | Attending: Family Medicine | Admitting: Physical Therapy

## 2014-09-17 ENCOUNTER — Other Ambulatory Visit: Payer: Medicare HMO

## 2014-09-17 DIAGNOSIS — M545 Low back pain, unspecified: Secondary | ICD-10-CM | POA: Insufficient documentation

## 2014-09-17 DIAGNOSIS — IMO0001 Reserved for inherently not codable concepts without codable children: Secondary | ICD-10-CM | POA: Insufficient documentation

## 2014-09-17 DIAGNOSIS — R5381 Other malaise: Secondary | ICD-10-CM | POA: Diagnosis not present

## 2014-09-18 ENCOUNTER — Telehealth: Payer: Self-pay | Admitting: Family Medicine

## 2014-09-18 DIAGNOSIS — E785 Hyperlipidemia, unspecified: Secondary | ICD-10-CM

## 2014-09-18 NOTE — Telephone Encounter (Signed)
No crestor-  bystolic samples given to daughter

## 2014-09-19 ENCOUNTER — Ambulatory Visit: Payer: Medicare HMO | Admitting: Physical Therapy

## 2014-09-19 DIAGNOSIS — IMO0001 Reserved for inherently not codable concepts without codable children: Secondary | ICD-10-CM | POA: Diagnosis not present

## 2014-09-21 MED ORDER — ROSUVASTATIN CALCIUM 20 MG PO TABS: 20.0000 mg | ORAL_TABLET | Freq: Every day | ORAL | Status: DC

## 2014-09-21 NOTE — Addendum Note (Signed)
Addended by: Ilean China on: 09/21/2014 03:06 PM   Modules accepted: Orders

## 2014-10-01 ENCOUNTER — Ambulatory Visit: Payer: Medicare HMO | Attending: Family Medicine | Admitting: Physical Therapy

## 2014-10-01 DIAGNOSIS — R5381 Other malaise: Secondary | ICD-10-CM | POA: Insufficient documentation

## 2014-10-01 DIAGNOSIS — Z5189 Encounter for other specified aftercare: Secondary | ICD-10-CM | POA: Insufficient documentation

## 2014-10-01 DIAGNOSIS — M545 Low back pain: Secondary | ICD-10-CM | POA: Insufficient documentation

## 2014-10-03 ENCOUNTER — Ambulatory Visit: Payer: Medicare HMO | Admitting: Physical Therapy

## 2014-10-03 ENCOUNTER — Ambulatory Visit (INDEPENDENT_AMBULATORY_CARE_PROVIDER_SITE_OTHER): Payer: Commercial Managed Care - HMO

## 2014-10-03 DIAGNOSIS — Z5189 Encounter for other specified aftercare: Secondary | ICD-10-CM | POA: Diagnosis not present

## 2014-10-03 DIAGNOSIS — Z23 Encounter for immunization: Secondary | ICD-10-CM

## 2014-10-08 ENCOUNTER — Ambulatory Visit: Payer: Medicare HMO | Admitting: Physical Therapy

## 2014-10-08 DIAGNOSIS — Z5189 Encounter for other specified aftercare: Secondary | ICD-10-CM | POA: Diagnosis not present

## 2014-10-10 ENCOUNTER — Ambulatory Visit: Payer: Medicare HMO | Admitting: Physical Therapy

## 2014-10-10 DIAGNOSIS — Z5189 Encounter for other specified aftercare: Secondary | ICD-10-CM | POA: Diagnosis not present

## 2014-10-15 ENCOUNTER — Ambulatory Visit: Payer: Medicare HMO | Admitting: Physical Therapy

## 2014-10-15 DIAGNOSIS — Z5189 Encounter for other specified aftercare: Secondary | ICD-10-CM | POA: Diagnosis not present

## 2014-10-17 ENCOUNTER — Ambulatory Visit: Payer: Medicare HMO | Admitting: Physical Therapy

## 2014-10-17 DIAGNOSIS — Z5189 Encounter for other specified aftercare: Secondary | ICD-10-CM | POA: Diagnosis not present

## 2014-10-18 ENCOUNTER — Ambulatory Visit (INDEPENDENT_AMBULATORY_CARE_PROVIDER_SITE_OTHER): Payer: Commercial Managed Care - HMO | Admitting: Nurse Practitioner

## 2014-10-18 ENCOUNTER — Encounter: Payer: Self-pay | Admitting: Nurse Practitioner

## 2014-10-18 ENCOUNTER — Telehealth: Payer: Self-pay | Admitting: Family Medicine

## 2014-10-18 VITALS — BP 159/81 | HR 80 | Temp 97.5°F | Ht 63.0 in | Wt 149.4 lb

## 2014-10-18 DIAGNOSIS — J0100 Acute maxillary sinusitis, unspecified: Secondary | ICD-10-CM

## 2014-10-18 MED ORDER — AZITHROMYCIN 250 MG PO TABS: ORAL_TABLET | ORAL | Status: DC

## 2014-10-18 NOTE — Telephone Encounter (Signed)
Call in a Z-Pak and continue to take Mucinex. If she does not get better she needs to come in and be seen

## 2014-10-18 NOTE — Telephone Encounter (Signed)
Med ordered and pt aware

## 2014-10-18 NOTE — Patient Instructions (Signed)

## 2014-10-18 NOTE — Telephone Encounter (Signed)
Please advise 

## 2014-10-18 NOTE — Progress Notes (Signed)
   Subjective:    Patient ID: Rita Wells, female    DOB: 11-Jan-1933, 80 y.o.   MRN: [id number]  HPI Patient is here today complaining of congestion that started Sunday morning. She reports runny nose, postnasal drip, sinus pressure. Productive cough with that went from white to yellow mucus.  She denies any fever, sore throat.    Review of Systems  HENT: Positive for congestion, rhinorrhea, sinus pressure and sneezing. Negative for sore throat and tinnitus.   Eyes: Positive for redness and itching.  All other systems reviewed and are negative.      Objective:   Physical Exam  Constitutional: She is oriented to person, place, and time. She appears well-developed and well-nourished.  HENT:  Head: Normocephalic.  Cardiovascular: Normal rate.   Pulmonary/Chest: Effort normal.  Musculoskeletal: Normal range of motion.  Neurological: She is alert and oriented to person, place, and time.    BP 159/81  Pulse 80  Temp(Src) 97.5 F (36.4 C) (Oral)  Ht 5\' 3"  (1.6 m)  Wt 149 lb 6.4 oz (67.767 kg)  BMI 26.47 kg/m2       Assessment & Plan:   1. Acute maxillary sinusitis, recurrence not specified    Dr. Laurance Flatten already called in z pak- take as directed 1. Take meds as prescribed 2. Use a cool mist humidifier especially during the winter months and when heat has been humid. 3. Use saline nose sprays frequently 4. Saline irrigations of the nose can be very helpful if done frequently.  * 4X daily for 1 week*  * Use of a nettie pot can be helpful with this. Follow directions with this* 5. Drink plenty of fluids 6. Keep thermostat turn down low 7.For any cough or congestion  Use plain Mucinex- regular strength or max strength is fine   * Children- consult with Pharmacist for dosing 8. For fever or aces or pains- take tylenol or ibuprofen appropriate for age and weight.  * for fevers greater than 101 orally you may alternate ibuprofen and tylenol every  3 hours.   Mary-Margaret  Hassell Done, FNP

## 2014-10-22 ENCOUNTER — Ambulatory Visit: Payer: Medicare HMO | Admitting: Physical Therapy

## 2014-10-22 DIAGNOSIS — Z5189 Encounter for other specified aftercare: Secondary | ICD-10-CM | POA: Diagnosis not present

## 2014-10-24 ENCOUNTER — Encounter: Payer: Medicare Other | Admitting: Physical Therapy

## 2014-10-29 ENCOUNTER — Other Ambulatory Visit: Payer: Self-pay | Admitting: Family Medicine

## 2014-10-29 DIAGNOSIS — F419 Anxiety disorder, unspecified: Secondary | ICD-10-CM

## 2014-10-29 MED ORDER — ALPRAZOLAM 0.25 MG PO TABS: 0.2500 mg | ORAL_TABLET | Freq: Two times a day (BID) | ORAL | Status: DC | PRN

## 2014-10-29 NOTE — Telephone Encounter (Signed)
Okay to refill xanax; same number as last refill

## 2014-10-30 ENCOUNTER — Other Ambulatory Visit: Payer: Self-pay | Admitting: Nurse Practitioner

## 2014-10-31 NOTE — Telephone Encounter (Signed)
Called rx into [facility]

## 2014-11-29 ENCOUNTER — Telehealth: Payer: Self-pay | Admitting: Family Medicine

## 2014-11-29 NOTE — Telephone Encounter (Signed)
No crestor available, samples of Bystolic 10mg  given to daughter, #28.

## 2014-12-05 ENCOUNTER — Telehealth: Payer: Self-pay | Admitting: Family Medicine

## 2014-12-05 NOTE — Telephone Encounter (Signed)
Appointment given for 1/27 @ 10am with Sabra Heck

## 2014-12-24 ENCOUNTER — Other Ambulatory Visit: Payer: Self-pay | Admitting: *Deleted

## 2014-12-24 DIAGNOSIS — F419 Anxiety disorder, unspecified: Secondary | ICD-10-CM

## 2014-12-24 MED ORDER — ALPRAZOLAM 0.25 MG PO TABS: 0.2500 mg | ORAL_TABLET | Freq: Two times a day (BID) | ORAL | Status: DC | PRN

## 2014-12-25 ENCOUNTER — Telehealth: Payer: Self-pay | Admitting: Family Medicine

## 2015-01-02 ENCOUNTER — Telehealth: Payer: Self-pay | Admitting: Family Medicine

## 2015-01-03 NOTE — Telephone Encounter (Signed)
Samples of crestor 20 mg, qty 28 given.

## 2015-01-23 ENCOUNTER — Encounter: Payer: Self-pay | Admitting: Family Medicine

## 2015-01-23 ENCOUNTER — Ambulatory Visit (INDEPENDENT_AMBULATORY_CARE_PROVIDER_SITE_OTHER): Payer: Commercial Managed Care - HMO | Admitting: Family Medicine

## 2015-01-23 VITALS — BP 177/93 | HR 86 | Temp 97.9°F | Ht 63.0 in | Wt 146.0 lb

## 2015-01-23 DIAGNOSIS — F419 Anxiety disorder, unspecified: Secondary | ICD-10-CM | POA: Diagnosis not present

## 2015-01-23 DIAGNOSIS — E785 Hyperlipidemia, unspecified: Secondary | ICD-10-CM | POA: Diagnosis not present

## 2015-01-23 DIAGNOSIS — G4700 Insomnia, unspecified: Secondary | ICD-10-CM | POA: Diagnosis not present

## 2015-01-23 DIAGNOSIS — I10 Essential (primary) hypertension: Secondary | ICD-10-CM | POA: Diagnosis not present

## 2015-01-23 MED ORDER — AMOXICILLIN 875 MG PO TABS: 875.0000 mg | ORAL_TABLET | Freq: Two times a day (BID) | ORAL | Status: DC

## 2015-01-23 MED ORDER — NEBIVOLOL HCL 10 MG PO TABS: 10.0000 mg | ORAL_TABLET | Freq: Every day | ORAL | Status: DC

## 2015-01-23 MED ORDER — ALPRAZOLAM 0.25 MG PO TABS: 0.2500 mg | ORAL_TABLET | Freq: Two times a day (BID) | ORAL | Status: DC | PRN

## 2015-01-23 NOTE — Progress Notes (Signed)
   Subjective:    Patient ID: Rita Wells, female    DOB: [birth year], 81 y.o.   MRN: [id number]  HPI 81 year old female comes in today to follow up on chronic medical conditions which include hypertension, hyperlipidemia, and insomnia. She needs a refill on Xanax and would like a more affordable blood pressure medication. She has been out of blood pressure medication for 2 months. She also has had some sinus drainage and cough for about the past 2 months. She has been taking OTC Mucinex without relief.   Patient Active Problem List   Diagnosis Date Noted  . Acute maxillary sinusitis 10/18/2014  . Hypertension 04/19/2014  . Hyperlipidemia 04/19/2014  . Generalized anxiety disorder 04/19/2014  . Insomnia 04/19/2014   Outpatient Encounter Prescriptions as of 01/23/2015  Medication Sig  . ALPRAZolam (XANAX) 0.25 MG tablet Take 1 tablet (0.25 mg total) by mouth 2 (two) times daily as needed for anxiety.  Marland Kitchen levothyroxine (SYNTHROID, LEVOTHROID) 75 MCG tablet Take 75 mcg by mouth daily before breakfast.  . loratadine (CLARITIN) 10 MG tablet Take 1 tablet (10 mg total) by mouth daily.  . nebivolol (BYSTOLIC) 10 MG tablet Take 1 tablet (10 mg total) by mouth daily.  . pantoprazole (PROTONIX) 40 MG tablet Take 1 tablet (40 mg total) by mouth daily.  . rosuvastatin (CRESTOR) 20 MG tablet Take 1 tablet (20 mg total) by mouth daily.  . traZODone (DESYREL) 50 MG tablet Take 2 tablets (100 mg total) by mouth at bedtime as needed for sleep.  . [DISCONTINUED] azithromycin (ZITHROMAX) 250 MG tablet As directed      Review of Systems  Constitutional: Negative.   HENT: Positive for postnasal drip and sinus pressure.   Eyes: Negative.   Respiratory: Positive for cough.   Cardiovascular: Negative.   Gastrointestinal: Negative.   Endocrine: Negative.   Genitourinary: Negative.   Hematological: Negative.   Psychiatric/Behavioral: Negative.        Objective:   Physical Exam  Constitutional:  She is oriented to person, place, and time. She appears well-developed and well-nourished.  HENT:  Bilateral maxillary sinus tenderness to percussion  Eyes: Conjunctivae and EOM are normal.  Neck: Normal range of motion. Neck supple.  Cardiovascular: Normal rate, regular rhythm and normal heart sounds.   Pulmonary/Chest: Effort normal and breath sounds normal.  Abdominal: Soft. Bowel sounds are normal.  Musculoskeletal: Normal range of motion.  Neurological: She is alert and oriented to person, place, and time. She has normal reflexes.  Skin: Skin is warm and dry.  Psychiatric: She has a normal mood and affect. Her behavior is normal. Thought content normal.   BP 177/93 mmHg  Pulse 86  Temp(Src) 97.9 F (36.6 C) (Oral)  Ht 5\' 3"  (1.6 m)  Wt 146 lb (66.225 kg)  BMI 25.87 kg/m2  SpO2 96%        Assessment & Plan:  1. Anxiety Takes med since husband's death - ALPRAZolam (XANAX) 0.25 MG tablet; Take 1 tablet (0.25 mg total) by mouth 2 (two) times daily as needed for anxiety.  Dispense: 60 tablet; Refill: 2  2. Insomnia Same as above  3. Essential hypertension This med should be affordable - nebivolol (BYSTOLIC) 10 MG tablet; Take 1 tablet (10 mg total) by mouth daily.  Dispense: 28 tablet; Refill: 0  Wardell Honour MD  4. Hyperlipidemia  Wardell Honour MD

## 2015-01-31 ENCOUNTER — Other Ambulatory Visit: Payer: Self-pay | Admitting: *Deleted

## 2015-01-31 DIAGNOSIS — K219 Gastro-esophageal reflux disease without esophagitis: Secondary | ICD-10-CM

## 2015-01-31 MED ORDER — PANTOPRAZOLE SODIUM 40 MG PO TBEC: 40.0000 mg | DELAYED_RELEASE_TABLET | Freq: Every day | ORAL | Status: DC

## 2015-02-25 ENCOUNTER — Other Ambulatory Visit: Payer: Self-pay | Admitting: Family Medicine

## 2015-02-25 NOTE — Telephone Encounter (Signed)
Aware,no sample available.

## 2015-03-14 DIAGNOSIS — E890 Postprocedural hypothyroidism: Secondary | ICD-10-CM | POA: Diagnosis not present

## 2015-03-20 DIAGNOSIS — E890 Postprocedural hypothyroidism: Secondary | ICD-10-CM | POA: Diagnosis not present

## 2015-05-07 ENCOUNTER — Other Ambulatory Visit: Payer: Self-pay

## 2015-05-07 DIAGNOSIS — J302 Other seasonal allergic rhinitis: Secondary | ICD-10-CM

## 2015-05-07 DIAGNOSIS — I10 Essential (primary) hypertension: Secondary | ICD-10-CM

## 2015-05-07 MED ORDER — NEBIVOLOL HCL 10 MG PO TABS: 10.0000 mg | ORAL_TABLET | Freq: Every day | ORAL | Status: DC

## 2015-05-08 ENCOUNTER — Telehealth: Payer: Self-pay | Admitting: Family Medicine

## 2015-05-08 DIAGNOSIS — F419 Anxiety disorder, unspecified: Secondary | ICD-10-CM

## 2015-05-08 MED ORDER — ALPRAZOLAM 0.25 MG PO TABS: 0.2500 mg | ORAL_TABLET | Freq: Two times a day (BID) | ORAL | Status: DC | PRN

## 2015-05-08 NOTE — Telephone Encounter (Signed)
Last filled 03/24/15, last seen 01/23/15. Call into [facility]

## 2015-05-09 NOTE — Telephone Encounter (Signed)
rx called to pharmacy 

## 2015-05-15 ENCOUNTER — Ambulatory Visit: Payer: Commercial Managed Care - HMO | Admitting: Family Medicine

## 2015-05-21 ENCOUNTER — Telehealth: Payer: Self-pay | Admitting: Family Medicine

## 2015-05-21 DIAGNOSIS — E785 Hyperlipidemia, unspecified: Secondary | ICD-10-CM

## 2015-05-21 MED ORDER — ROSUVASTATIN CALCIUM 20 MG PO TABS: 20.0000 mg | ORAL_TABLET | Freq: Every day | ORAL | Status: DC

## 2015-05-21 NOTE — Telephone Encounter (Signed)
Patient aware.

## 2015-05-21 NOTE — Telephone Encounter (Signed)
Okay to refill Crestor

## 2015-05-21 NOTE — Telephone Encounter (Signed)
Been over 1 year since lipid or liver labs

## 2015-05-21 NOTE — Telephone Encounter (Signed)
Cancelled appt for 05/15/15. No f/u scheduled.

## 2015-05-23 ENCOUNTER — Telehealth: Payer: Self-pay | Admitting: Family Medicine

## 2015-05-23 ENCOUNTER — Telehealth: Payer: Self-pay

## 2015-05-23 NOTE — Telephone Encounter (Signed)
Insurance prior authorized Crestor 20 mg

## 2015-06-25 ENCOUNTER — Other Ambulatory Visit: Payer: Self-pay

## 2015-06-25 ENCOUNTER — Telehealth: Payer: Self-pay | Admitting: Family Medicine

## 2015-06-25 DIAGNOSIS — E785 Hyperlipidemia, unspecified: Secondary | ICD-10-CM

## 2015-06-25 MED ORDER — ROSUVASTATIN CALCIUM 20 MG PO TABS: 20.0000 mg | ORAL_TABLET | Freq: Every day | ORAL | Status: DC

## 2015-06-25 NOTE — Telephone Encounter (Signed)
Last seen 01/23/15 Dr Sabra Heck  Last lipid 04/19/14

## 2015-07-03 ENCOUNTER — Ambulatory Visit (INDEPENDENT_AMBULATORY_CARE_PROVIDER_SITE_OTHER): Payer: Commercial Managed Care - HMO | Admitting: Family Medicine

## 2015-07-03 ENCOUNTER — Encounter: Payer: Self-pay | Admitting: Family Medicine

## 2015-07-03 VITALS — BP 140/91 | HR 64 | Temp 97.6°F | Ht 63.0 in | Wt 145.0 lb

## 2015-07-03 DIAGNOSIS — I10 Essential (primary) hypertension: Secondary | ICD-10-CM

## 2015-07-03 DIAGNOSIS — E0500 Thyrotoxicosis with diffuse goiter without thyrotoxic crisis or storm: Secondary | ICD-10-CM | POA: Insufficient documentation

## 2015-07-03 DIAGNOSIS — F411 Generalized anxiety disorder: Secondary | ICD-10-CM

## 2015-07-03 DIAGNOSIS — E785 Hyperlipidemia, unspecified: Secondary | ICD-10-CM

## 2015-07-03 DIAGNOSIS — E039 Hypothyroidism, unspecified: Secondary | ICD-10-CM | POA: Diagnosis not present

## 2015-07-03 NOTE — Patient Instructions (Signed)

## 2015-07-03 NOTE — Progress Notes (Signed)
Subjective:    Patient ID: Rita Wells, female    DOB: 02/02/1933, 81 y.o.   MRN: [id number]  HPI 81 year old female with hypertension, hyperlipidemia, and hypothyroidism. Regarding the latter, she has seen endocrinologist in [place] who has been adjusting thyroid supplement. Her blood pressure is up a little bit today. She did not take her alprazolam this morning and that could account. Generally it has been well regulated so I'm not inclined to make snap decisions but I have asked her to monitor pressure and let me know if it normalizes.    Review of Systems  Constitutional: Negative.   HENT: Negative.   Respiratory: Negative.   Cardiovascular: Negative.   Gastrointestinal: Negative.   Neurological: Negative.   Psychiatric/Behavioral: Negative.        Patient Active Problem List   Diagnosis Date Noted  . Hypothyroidism 07/03/2015  . Toxic goiter   . Acute maxillary sinusitis 10/18/2014  . Hypertension 04/19/2014  . Hyperlipidemia 04/19/2014  . Generalized anxiety disorder 04/19/2014  . Insomnia 04/19/2014   Outpatient Encounter Prescriptions as of 07/03/2015  Medication Sig  . ALPRAZolam (XANAX) 0.25 MG tablet Take 1 tablet (0.25 mg total) by mouth 2 (two) times daily as needed for anxiety.  Marland Kitchen levothyroxine (SYNTHROID, LEVOTHROID) 75 MCG tablet Take 75 mcg by mouth daily before breakfast.  . nebivolol (BYSTOLIC) 10 MG tablet Take 1 tablet (10 mg total) by mouth daily.  . pantoprazole (PROTONIX) 40 MG tablet Take 1 tablet (40 mg total) by mouth daily.  . rosuvastatin (CRESTOR) 20 MG tablet Take 1 tablet (20 mg total) by mouth daily.  . traZODone (DESYREL) 50 MG tablet Take 2 tablets (100 mg total) by mouth at bedtime as needed for sleep.  Marland Kitchen loratadine (CLARITIN) 10 MG tablet Take 1 tablet (10 mg total) by mouth daily. (Patient not taking: Reported on 07/03/2015)  . [DISCONTINUED] amoxicillin (AMOXIL) 875 MG tablet Take 1 tablet (875 mg total) by mouth 2 (two) times daily.    No facility-administered encounter medications on file as of 07/03/2015.    Objective:   Physical Exam  Constitutional: She is oriented to person, place, and time. She appears well-developed and well-nourished.  Cardiovascular: Normal rate, regular rhythm and normal heart sounds.   Pulmonary/Chest: Effort normal and breath sounds normal.  Abdominal: Soft.  Neurological: She is alert and oriented to person, place, and time.          Assessment & Plan:  1. Essential hypertension Pressure is slightly elevated today. I am not inclined to change medicine but will monitor  2. Hyperlipidemia Last lipids in April 2015 were at goal. She has no side effects from Crestor - CMP14+EGFR - Lipid panel  3. Generalized anxiety disorder Doing Xanax 0.25, one half tablet in the morning and one whole tablet at bedtime  4. Hypothyroidism, unspecified hypothyroidism type Dosage was last changed in March. Need to check TSH to see how it has been affected by the change in dose - TSH  Wardell Honour MD

## 2015-07-04 LAB — LIPID PANEL
Chol/HDL Ratio: 3.7 ratio units (ref 0.0–4.4)
Cholesterol, Total: 168 mg/dL (ref 100–199)
HDL: 46 mg/dL (ref >39)
LDL Calculated: 86 mg/dL (ref 0–99)
TRIGLYCERIDES: 178 mg/dL — AB (ref 0–149)
VLDL Cholesterol Cal: 36 mg/dL (ref 5–40)

## 2015-07-04 LAB — CMP14+EGFR
A/G RATIO: 1.8 (ref 1.1–2.5)
ALK PHOS: 94 IU/L (ref 39–117)
ALT: 15 IU/L (ref 0–32)
AST: 20 IU/L (ref 0–40)
Albumin: 4.1 g/dL (ref 3.5–4.8)
BILIRUBIN TOTAL: 0.4 mg/dL (ref 0.0–1.2)
BUN / CREAT RATIO: 13 (ref 11–26)
BUN: 14 mg/dL (ref 8–27)
CO2: 24 mmol/L (ref 18–29)
Calcium: 9.4 mg/dL (ref 8.7–10.3)
Chloride: 101 mmol/L (ref 97–108)
Creatinine, Ser: 1.04 mg/dL — ABNORMAL HIGH (ref 0.57–1.00)
GFR calc non Af Amer: 51 mL/min/{1.73_m2} — ABNORMAL LOW (ref >59)
GFR, EST AFRICAN AMERICAN: 59 mL/min/{1.73_m2} — AB (ref >59)
Globulin, Total: 2.3 g/dL (ref 1.5–4.5)
Glucose: 91 mg/dL (ref 65–99)
Potassium: 4.6 mmol/L (ref 3.5–5.2)
SODIUM: 140 mmol/L (ref 134–144)
TOTAL PROTEIN: 6.4 g/dL (ref 6.0–8.5)

## 2015-07-04 LAB — TSH: TSH: 2.03 u[IU]/mL (ref 0.450–4.500)

## 2015-07-15 ENCOUNTER — Telehealth: Payer: Self-pay | Admitting: Family Medicine

## 2015-07-15 DIAGNOSIS — F419 Anxiety disorder, unspecified: Secondary | ICD-10-CM

## 2015-07-16 MED ORDER — ALPRAZOLAM 0.25 MG PO TABS: 0.2500 mg | ORAL_TABLET | Freq: Two times a day (BID) | ORAL | Status: DC | PRN

## 2015-07-16 NOTE — Telephone Encounter (Signed)
Rx refilled at patient request

## 2015-07-25 ENCOUNTER — Other Ambulatory Visit: Payer: Self-pay | Admitting: Family Medicine

## 2015-07-29 ENCOUNTER — Ambulatory Visit (INDEPENDENT_AMBULATORY_CARE_PROVIDER_SITE_OTHER): Payer: Commercial Managed Care - HMO | Admitting: Physician Assistant

## 2015-07-29 ENCOUNTER — Encounter: Payer: Self-pay | Admitting: Physician Assistant

## 2015-07-29 VITALS — BP 165/91 | HR 68 | Temp 97.1°F | Ht 63.0 in | Wt 145.0 lb

## 2015-07-29 DIAGNOSIS — L570 Actinic keratosis: Secondary | ICD-10-CM

## 2015-07-29 NOTE — Addendum Note (Signed)
Addended by: Marline Backbone A on: 07/29/2015 03:13 PM   Modules accepted: Level of Service

## 2015-07-29 NOTE — Progress Notes (Signed)
   Subjective:    Patient ID: Rita Wells, female    DOB: 04-13-1933, 81 y.o.   MRN: [id number]  HPI 81 y/o female presents with c/o new appearing lesion on legs, BUE and back. The lesions are red and occasionally itch. History of non mole skin cancer on nose, treated at [facility] 6 years ago. Has not been to the Dermatologist in the past few years.     Review of Systems  Constitutional: Negative.   Skin:       Red lesions on BLE, back , BUE Denies pain, occasional itch  Enlarging lesion on back along bra line        Objective:   Physical Exam  Constitutional: She is oriented to person, place, and time.  Neck: No tracheal deviation present.  Neurological: She is alert and oriented to person, place, and time.  Skin:  Flesh colored mass on mid back, approximately 1.5 cm in size, appears to be benign neurofibroma  Scaling actinic changes on BLE, face and BUE  Psychiatric: She has a normal mood and affect. Her behavior is normal. Judgment and thought content normal.          Assessment & Plan:  1. Actinic keratoses Lesions on face, BLE and BUE treated with cryosurgery x 11. If lesions recur, she needs to rto for biopsy.   2. Neurofibroma on back - Most likely benign. Have discussed removal with patient but told her that it is possible insurance will not cover it due to the benign diagnosis. She will follow up if she would like it removed.   I also suggest yearly skin checks due to patients h/o skin cancer and amount of significant actinic changes.    Continue all meds RTO 1 year   Tarica Harl A. Benjamin Stain PA-C

## 2015-07-29 NOTE — Patient Instructions (Signed)
Neurofibroma  Seborrheic keratosis

## 2015-08-06 ENCOUNTER — Other Ambulatory Visit: Payer: Self-pay | Admitting: *Deleted

## 2015-08-06 DIAGNOSIS — I10 Essential (primary) hypertension: Secondary | ICD-10-CM

## 2015-08-06 MED ORDER — NEBIVOLOL HCL 10 MG PO TABS: 10.0000 mg | ORAL_TABLET | Freq: Every day | ORAL | Status: DC

## 2015-08-23 ENCOUNTER — Other Ambulatory Visit: Payer: Self-pay | Admitting: *Deleted

## 2015-08-23 DIAGNOSIS — K219 Gastro-esophageal reflux disease without esophagitis: Secondary | ICD-10-CM

## 2015-08-23 MED ORDER — PANTOPRAZOLE SODIUM 40 MG PO TBEC: 40.0000 mg | DELAYED_RELEASE_TABLET | Freq: Every day | ORAL | Status: DC

## 2015-09-24 ENCOUNTER — Other Ambulatory Visit: Payer: Self-pay

## 2015-09-24 DIAGNOSIS — F419 Anxiety disorder, unspecified: Secondary | ICD-10-CM

## 2015-09-24 MED ORDER — ALPRAZOLAM 0.25 MG PO TABS: 0.2500 mg | ORAL_TABLET | Freq: Two times a day (BID) | ORAL | Status: DC | PRN

## 2015-09-24 NOTE — Telephone Encounter (Signed)
Last seen 07/29/15 Rita Wells  If approved route to nurse to call into [facility].   [id number]

## 2015-09-25 NOTE — Telephone Encounter (Signed)
rx called into pharmacy

## 2015-10-02 ENCOUNTER — Ambulatory Visit: Payer: Commercial Managed Care - HMO

## 2015-10-02 DIAGNOSIS — Z23 Encounter for immunization: Secondary | ICD-10-CM

## 2015-10-14 ENCOUNTER — Ambulatory Visit (INDEPENDENT_AMBULATORY_CARE_PROVIDER_SITE_OTHER): Payer: Commercial Managed Care - HMO | Admitting: *Deleted

## 2015-10-14 VITALS — BP 139/84

## 2015-10-14 DIAGNOSIS — I10 Essential (primary) hypertension: Secondary | ICD-10-CM

## 2015-10-14 NOTE — Progress Notes (Signed)
Pt here today for BP check.

## 2015-10-30 ENCOUNTER — Encounter: Payer: Self-pay | Admitting: Family Medicine

## 2015-10-30 ENCOUNTER — Ambulatory Visit (INDEPENDENT_AMBULATORY_CARE_PROVIDER_SITE_OTHER): Payer: Commercial Managed Care - HMO | Admitting: Family Medicine

## 2015-10-30 VITALS — BP 170/90 | HR 76 | Temp 97.6°F | Ht 63.0 in | Wt 149.8 lb

## 2015-10-30 DIAGNOSIS — I10 Essential (primary) hypertension: Secondary | ICD-10-CM

## 2015-10-30 DIAGNOSIS — C4491 Basal cell carcinoma of skin, unspecified: Secondary | ICD-10-CM | POA: Diagnosis not present

## 2015-10-30 NOTE — Progress Notes (Signed)
   Subjective:    Patient ID: Rita Wells, female    DOB: 24-Apr-1933, 81 y.o.   MRN: [id number]  HPI 81 year old female who presents today for evaluation of a nonhealing lesion on her left forearm. This was frozen with liquid nitrogen about 2 months ago and thought to be an actinic keratosis. It did not resolve with this treatment and now is bigger and sore. She has a history of several skin cancers.  Patient Active Problem List   Diagnosis Date Noted  . Hypothyroidism 07/03/2015  . Toxic goiter   . Acute maxillary sinusitis 10/18/2014  . Hypertension 04/19/2014  . Hyperlipidemia 04/19/2014  . Generalized anxiety disorder 04/19/2014  . Insomnia 04/19/2014   Outpatient Encounter Prescriptions as of 10/30/2015  Medication Sig  . ALPRAZolam (XANAX) 0.25 MG tablet Take 1 tablet (0.25 mg total) by mouth 2 (two) times daily as needed for anxiety.  . CRESTOR 20 MG tablet TAKE ONE TABLET BY MOUTH EVERY DAY  . levothyroxine (SYNTHROID, LEVOTHROID) 75 MCG tablet Take 75 mcg by mouth daily before breakfast.  . loratadine (CLARITIN) 10 MG tablet Take 1 tablet (10 mg total) by mouth daily.  . nebivolol (BYSTOLIC) 10 MG tablet Take 1 tablet (10 mg total) by mouth daily.  . pantoprazole (PROTONIX) 40 MG tablet Take 1 tablet (40 mg total) by mouth daily.  . traZODone (DESYREL) 50 MG tablet Take 2 tablets (100 mg total) by mouth at bedtime as needed for sleep.   No facility-administered encounter medications on file as of 10/30/2015.      Review of Systems  Constitutional: Negative.   Cardiovascular: Negative.   Skin: Positive for wound.  Psychiatric/Behavioral: Negative.        Objective:   Physical Exam  Constitutional: She appears well-developed and well-nourished.  Skin:  Lesion on left forearm has an elevated border and the center of hard yellow material. This is suspicious for basal cell skin cancer and I think she would be best served by sending her to a dermatologist for biopsy or  removal.          Assessment & Plan:  1. Basal cell carcinoma See note above but I believe this is suspicious for basal cell skin cancer. - Ambulatory referral to Dermatology   2. Essential hypertension Blood pressure is elevated today and in reviewing her blood pressure from previous visits it has tended to be elevated almost every time. I think she might benefit from addition of a diuretic or calcium channel blocker to her regimen for blood pressure. Will address at next visit  Wardell Honour MD  Notice: Thisdictation was prepared with Dragon dictation along with smaller phrase technology. Any transcriptional errors that result from this process are unintentional and may not be corrected upon review

## 2015-11-25 DIAGNOSIS — L821 Other seborrheic keratosis: Secondary | ICD-10-CM | POA: Diagnosis not present

## 2015-11-25 DIAGNOSIS — D239 Other benign neoplasm of skin, unspecified: Secondary | ICD-10-CM | POA: Diagnosis not present

## 2015-11-25 DIAGNOSIS — C44629 Squamous cell carcinoma of skin of left upper limb, including shoulder: Secondary | ICD-10-CM | POA: Diagnosis not present

## 2015-11-26 ENCOUNTER — Ambulatory Visit (INDEPENDENT_AMBULATORY_CARE_PROVIDER_SITE_OTHER): Payer: Commercial Managed Care - HMO | Admitting: *Deleted

## 2015-11-26 VITALS — BP 160/96 | HR 79

## 2015-11-26 DIAGNOSIS — I10 Essential (primary) hypertension: Secondary | ICD-10-CM

## 2015-11-26 NOTE — Progress Notes (Signed)
Patient is here today for a BP rck. Patients BP 160/96 mmHg  Pulse 79

## 2015-11-26 NOTE — Patient Instructions (Signed)
Hypertension Hypertension, commonly called high blood pressure, is when the force of blood pumping through your arteries is too strong. Your arteries are the blood vessels that carry blood from your heart throughout your body. A blood pressure reading consists of a higher number over a lower number, such as 110/72. The higher number (systolic) is the pressure inside your arteries when your heart pumps. The lower number (diastolic) is the pressure inside your arteries when your heart relaxes. Ideally you want your blood pressure below 120/80. Hypertension forces your heart to work harder to pump blood. Your arteries may become narrow or stiff. Having untreated or uncontrolled hypertension can cause heart attack, stroke, kidney disease, and other problems. RISK FACTORS Some risk factors for high blood pressure are controllable. Others are not.  Risk factors you cannot control include:   Race. You may be at higher risk if you are African American.  Age. Risk increases with age.  Gender. Men are at higher risk than women before age 45 years. After age 65, women are at higher risk than men. Risk factors you can control include:  Not getting enough exercise or physical activity.  Being overweight.  Getting too much fat, sugar, calories, or salt in your diet.  Drinking too much alcohol. SIGNS AND SYMPTOMS Hypertension does not usually cause signs or symptoms. Extremely high blood pressure (hypertensive crisis) may cause headache, anxiety, shortness of breath, and nosebleed. DIAGNOSIS To check if you have hypertension, your health care provider will measure your blood pressure while you are seated, with your arm held at the level of your heart. It should be measured at least twice using the same arm. Certain conditions can cause a difference in blood pressure between your right and left arms. A blood pressure reading that is higher than normal on one occasion does not mean that you need treatment. If  it is not clear whether you have high blood pressure, you may be asked to return on a different day to have your blood pressure checked again. Or, you may be asked to monitor your blood pressure at home for 1 or more weeks. TREATMENT Treating high blood pressure includes making lifestyle changes and possibly taking medicine. Living a healthy lifestyle can help lower high blood pressure. You may need to change some of your habits. Lifestyle changes may include:  Following the DASH diet. This diet is high in fruits, vegetables, and whole grains. It is low in salt, red meat, and added sugars.  Keep your sodium intake below 2,300 mg per day.  Getting at least 30-45 minutes of aerobic exercise at least 4 times per week.  Losing weight if necessary.  Not smoking.  Limiting alcoholic beverages.  Learning ways to reduce stress. Your health care provider may prescribe medicine if lifestyle changes are not enough to get your blood pressure under control, and if one of the following is true:  You are 18-59 years of age and your systolic blood pressure is above 140.  You are 60 years of age or older, and your systolic blood pressure is above 150.  Your diastolic blood pressure is above 90.  You have diabetes, and your systolic blood pressure is over 140 or your diastolic blood pressure is over 90.  You have kidney disease and your blood pressure is above 140/90.  You have heart disease and your blood pressure is above 140/90. Your personal target blood pressure may vary depending on your medical conditions, your age, and other factors. HOME CARE INSTRUCTIONS    Have your blood pressure rechecked as directed by your health care provider.   Take medicines only as directed by your health care provider. Follow the directions carefully. Blood pressure medicines must be taken as prescribed. The medicine does not work as well when you skip doses. Skipping doses also puts you at risk for  problems.  Do not smoke.   Monitor your blood pressure at home as directed by your health care provider. SEEK MEDICAL CARE IF:   You think you are having a reaction to medicines taken.  You have recurrent headaches or feel dizzy.  You have swelling in your ankles.  You have trouble with your vision. SEEK IMMEDIATE MEDICAL CARE IF:  You develop a severe headache or confusion.  You have unusual weakness, numbness, or feel faint.  You have severe chest or abdominal pain.  You vomit repeatedly.  You have trouble breathing. MAKE SURE YOU:   Understand these instructions.  Will watch your condition.  Will get help right away if you are not doing well or get worse.   This information is not intended to replace advice given to you by your health care provider. Make sure you discuss any questions you have with your health care provider.   Document Released: 12/14/2005 Document Revised: 04/30/2015 Document Reviewed: 10/06/2013 Elsevier Interactive Patient Education 2016 Elsevier Inc.  

## 2015-12-02 ENCOUNTER — Telehealth: Payer: Self-pay | Admitting: Family Medicine

## 2015-12-02 DIAGNOSIS — F419 Anxiety disorder, unspecified: Secondary | ICD-10-CM

## 2015-12-02 MED ORDER — ALPRAZOLAM 0.25 MG PO TABS: 0.2500 mg | ORAL_TABLET | Freq: Two times a day (BID) | ORAL | Status: DC | PRN

## 2015-12-02 NOTE — Telephone Encounter (Signed)
This is okay to refill 1 

## 2015-12-02 NOTE — Telephone Encounter (Signed)
Patient of Sabra Heck. Last office visit was 10-30-15. Rx last filled on 09-24-15 for #60 with 1 RF. Please advise and route to Pool A so nurse can phone patient and call to pharmacy

## 2015-12-02 NOTE — Telephone Encounter (Signed)
rx called into pharmacy. Pt aware.

## 2015-12-26 DIAGNOSIS — L570 Actinic keratosis: Secondary | ICD-10-CM | POA: Diagnosis not present

## 2015-12-26 DIAGNOSIS — C44621 Squamous cell carcinoma of skin of unspecified upper limb, including shoulder: Secondary | ICD-10-CM | POA: Diagnosis not present

## 2016-01-07 ENCOUNTER — Telehealth: Payer: Self-pay | Admitting: Family Medicine

## 2016-01-07 MED ORDER — HYDROCODONE-HOMATROPINE 5-1.5 MG/5ML PO SYRP: 5.0000 mL | ORAL_SOLUTION | Freq: Three times a day (TID) | ORAL | Status: DC | PRN

## 2016-01-07 MED ORDER — HYDROCODONE-HOMATROPINE 5-1.5 MG/5ML PO SYRP
5.0000 mL | ORAL_SOLUTION | Freq: Three times a day (TID) | ORAL | Status: DC | PRN
Start: 2016-01-07 — End: 2016-05-12

## 2016-01-07 NOTE — Telephone Encounter (Signed)
Signed Rx prescribed By Dr. Sabra Heck, agree with management.   Laroy Apple, MD [facility] 01/07/2016, 5:41 PM

## 2016-01-07 NOTE — Addendum Note (Signed)
Addended by: Timmothy Euler on: 01/07/2016 05:41 PM   Modules accepted: Orders

## 2016-01-07 NOTE — Telephone Encounter (Signed)
Rx filled per patient request 

## 2016-01-08 NOTE — Telephone Encounter (Signed)
Script for Rita Wells is ready.

## 2016-01-10 ENCOUNTER — Encounter: Payer: Self-pay | Admitting: Family Medicine

## 2016-01-13 ENCOUNTER — Telehealth: Payer: Self-pay | Admitting: Family Medicine

## 2016-01-21 ENCOUNTER — Other Ambulatory Visit: Payer: Self-pay | Admitting: Family Medicine

## 2016-01-21 ENCOUNTER — Other Ambulatory Visit: Payer: Self-pay | Admitting: *Deleted

## 2016-01-21 MED ORDER — ROSUVASTATIN CALCIUM 20 MG PO TABS: 20.0000 mg | ORAL_TABLET | Freq: Every day | ORAL | Status: DC

## 2016-01-21 MED ORDER — LEVOTHYROXINE SODIUM 75 MCG PO TABS: 75.0000 ug | ORAL_TABLET | Freq: Every day | ORAL | Status: DC

## 2016-01-21 NOTE — Telephone Encounter (Signed)
done

## 2016-02-03 ENCOUNTER — Other Ambulatory Visit: Payer: Self-pay | Admitting: *Deleted

## 2016-02-03 DIAGNOSIS — I10 Essential (primary) hypertension: Secondary | ICD-10-CM

## 2016-02-03 MED ORDER — NEBIVOLOL HCL 10 MG PO TABS: 10.0000 mg | ORAL_TABLET | Freq: Every day | ORAL | Status: DC

## 2016-02-11 ENCOUNTER — Encounter: Payer: Self-pay | Admitting: Family Medicine

## 2016-02-11 ENCOUNTER — Ambulatory Visit (INDEPENDENT_AMBULATORY_CARE_PROVIDER_SITE_OTHER): Payer: Commercial Managed Care - HMO | Admitting: Family Medicine

## 2016-02-11 VITALS — BP 162/86 | HR 66 | Temp 97.0°F | Ht 63.0 in | Wt 151.0 lb

## 2016-02-11 DIAGNOSIS — F419 Anxiety disorder, unspecified: Secondary | ICD-10-CM

## 2016-02-11 MED ORDER — ALPRAZOLAM 0.25 MG PO TABS: 0.2500 mg | ORAL_TABLET | Freq: Two times a day (BID) | ORAL | Status: DC | PRN

## 2016-02-11 NOTE — Progress Notes (Signed)
   Subjective:    Patient ID: Rita Wells, female    DOB: Aug 03, 1933, 82 y.o.   MRN: [id number]  HPI 82 year old female here to follow-up hypertension anxiety hypothyroidism. She has no complaints today. Her lipids are managed with rosuvastatin blood pressure is okay for age at 64/86. She has had onset of some allergy symptoms as she has been outside more with warm weather.  Patient Active Problem List   Diagnosis Date Noted  . Hypothyroidism 07/03/2015  . Toxic goiter   . Acute maxillary sinusitis 10/18/2014  . Hypertension 04/19/2014  . Hyperlipidemia 04/19/2014  . Generalized anxiety disorder 04/19/2014  . Insomnia 04/19/2014   Outpatient Encounter Prescriptions as of 02/11/2016  Medication Sig  . ALPRAZolam (XANAX) 0.25 MG tablet Take 1 tablet (0.25 mg total) by mouth 2 (two) times daily as needed for anxiety.  Marland Kitchen HYDROcodone-homatropine (HYCODAN) 5-1.5 MG/5ML syrup Take 5 mLs by mouth every 8 (eight) hours as needed for cough.  . levothyroxine (SYNTHROID, LEVOTHROID) 75 MCG tablet Take 1 tablet (75 mcg total) by mouth daily before breakfast.  . loratadine (CLARITIN) 10 MG tablet Take 1 tablet (10 mg total) by mouth daily.  . nebivolol (BYSTOLIC) 10 MG tablet Take 1 tablet (10 mg total) by mouth daily.  . pantoprazole (PROTONIX) 40 MG tablet Take 1 tablet (40 mg total) by mouth daily.  . rosuvastatin (CRESTOR) 20 MG tablet Take 1 tablet (20 mg total) by mouth daily.  . traZODone (DESYREL) 50 MG tablet Take 2 tablets (100 mg total) by mouth at bedtime as needed for sleep.   No facility-administered encounter medications on file as of 02/11/2016.      Review of Systems  HENT: Positive for rhinorrhea.   Respiratory: Negative.   Cardiovascular: Negative.   Neurological: Negative.        Objective:   Physical Exam  Constitutional: She is oriented to person, place, and time. She appears well-developed and well-nourished.  Cardiovascular: Normal rate, regular rhythm and  normal heart sounds.   Pulmonary/Chest: Effort normal and breath sounds normal.  Neurological: She is alert and oriented to person, place, and time.  Psychiatric: She has a normal mood and affect.          Assessment & Plan:  1. Anxiety Patient takes 1-1/2 alprazolam per day and that controls her anxiety - ALPRAZolam (XANAX) 0.25 MG tablet; Take 1 tablet (0.25 mg total) by mouth 2 (two) times daily as needed for anxiety.  Dispense: 60 tablet; Refill: 1 - CMP14+EGFR - Lipid panel - Thyroid Panel With TSH  Wardell Honour MD

## 2016-02-12 LAB — CMP14+EGFR
ALBUMIN: 4 g/dL (ref 3.5–4.7)
ALK PHOS: 88 IU/L (ref 39–117)
ALT: 11 IU/L (ref 0–32)
AST: 19 IU/L (ref 0–40)
Albumin/Globulin Ratio: 1.5 (ref 1.1–2.5)
BILIRUBIN TOTAL: 0.5 mg/dL (ref 0.0–1.2)
BUN / CREAT RATIO: 14 (ref 11–26)
BUN: 16 mg/dL (ref 8–27)
CHLORIDE: 100 mmol/L (ref 96–106)
CO2: 23 mmol/L (ref 18–29)
Calcium: 9.1 mg/dL (ref 8.7–10.3)
Creatinine, Ser: 1.18 mg/dL — ABNORMAL HIGH (ref 0.57–1.00)
GFR calc Af Amer: 50 mL/min/{1.73_m2} — ABNORMAL LOW (ref >59)
GFR calc non Af Amer: 44 mL/min/{1.73_m2} — ABNORMAL LOW (ref >59)
GLOBULIN, TOTAL: 2.6 g/dL (ref 1.5–4.5)
Glucose: 87 mg/dL (ref 65–99)
POTASSIUM: 4.3 mmol/L (ref 3.5–5.2)
SODIUM: 140 mmol/L (ref 134–144)
Total Protein: 6.6 g/dL (ref 6.0–8.5)

## 2016-02-12 LAB — THYROID PANEL WITH TSH
FREE THYROXINE INDEX: 3.2 (ref 1.2–4.9)
T3 UPTAKE RATIO: 28 % (ref 24–39)
T4 TOTAL: 11.5 ug/dL (ref 4.5–12.0)
TSH: 4.01 u[IU]/mL (ref 0.450–4.500)

## 2016-02-12 LAB — LIPID PANEL
CHOLESTEROL TOTAL: 172 mg/dL (ref 100–199)
Chol/HDL Ratio: 3.6 ratio units (ref 0.0–4.4)
HDL: 48 mg/dL (ref >39)
LDL Calculated: 91 mg/dL (ref 0–99)
TRIGLYCERIDES: 166 mg/dL — AB (ref 0–149)
VLDL Cholesterol Cal: 33 mg/dL (ref 5–40)

## 2016-02-14 ENCOUNTER — Other Ambulatory Visit: Payer: Self-pay | Admitting: Family Medicine

## 2016-02-14 DIAGNOSIS — F419 Anxiety disorder, unspecified: Secondary | ICD-10-CM

## 2016-02-14 MED ORDER — ALPRAZOLAM 0.25 MG PO TABS: 0.2500 mg | ORAL_TABLET | Freq: Two times a day (BID) | ORAL | Status: DC | PRN

## 2016-02-14 NOTE — Telephone Encounter (Signed)
Pt aware.

## 2016-02-14 NOTE — Telephone Encounter (Signed)
Says it was printed on 02/11/16, she says she dont have it

## 2016-04-08 DIAGNOSIS — E890 Postprocedural hypothyroidism: Secondary | ICD-10-CM | POA: Diagnosis not present

## 2016-04-21 ENCOUNTER — Other Ambulatory Visit: Payer: Self-pay | Admitting: *Deleted

## 2016-04-21 DIAGNOSIS — F419 Anxiety disorder, unspecified: Secondary | ICD-10-CM

## 2016-04-21 MED ORDER — ALPRAZOLAM 0.25 MG PO TABS
0.2500 mg | ORAL_TABLET | Freq: Two times a day (BID) | ORAL | Status: DC | PRN
Start: 2016-04-21 — End: 2016-05-12

## 2016-04-21 NOTE — Telephone Encounter (Signed)
Last filled 3/202/2017. If approved please route to pool and have nurse call into [facility]

## 2016-04-24 ENCOUNTER — Other Ambulatory Visit: Payer: Self-pay

## 2016-04-24 MED ORDER — ROSUVASTATIN CALCIUM 20 MG PO TABS: 20.0000 mg | ORAL_TABLET | Freq: Every day | ORAL | Status: DC

## 2016-05-04 ENCOUNTER — Telehealth: Payer: Self-pay | Admitting: Family Medicine

## 2016-05-04 DIAGNOSIS — I10 Essential (primary) hypertension: Secondary | ICD-10-CM

## 2016-05-04 MED ORDER — LEVOTHYROXINE SODIUM 75 MCG PO TABS: 75.0000 ug | ORAL_TABLET | Freq: Every day | ORAL | Status: DC

## 2016-05-04 MED ORDER — NEBIVOLOL HCL 10 MG PO TABS: 10.0000 mg | ORAL_TABLET | Freq: Every day | ORAL | Status: DC

## 2016-05-04 NOTE — Telephone Encounter (Signed)
done

## 2016-05-12 ENCOUNTER — Ambulatory Visit (INDEPENDENT_AMBULATORY_CARE_PROVIDER_SITE_OTHER): Payer: Commercial Managed Care - HMO | Admitting: Family Medicine

## 2016-05-12 ENCOUNTER — Encounter: Payer: Self-pay | Admitting: Family Medicine

## 2016-05-12 VITALS — BP 143/92 | HR 62 | Temp 97.3°F | Ht 63.0 in | Wt 149.4 lb

## 2016-05-12 DIAGNOSIS — F411 Generalized anxiety disorder: Secondary | ICD-10-CM | POA: Diagnosis not present

## 2016-05-12 DIAGNOSIS — D485 Neoplasm of uncertain behavior of skin: Secondary | ICD-10-CM | POA: Diagnosis not present

## 2016-05-12 DIAGNOSIS — I10 Essential (primary) hypertension: Secondary | ICD-10-CM | POA: Diagnosis not present

## 2016-05-12 DIAGNOSIS — E039 Hypothyroidism, unspecified: Secondary | ICD-10-CM

## 2016-05-12 DIAGNOSIS — D0461 Carcinoma in situ of skin of right upper limb, including shoulder: Secondary | ICD-10-CM | POA: Diagnosis not present

## 2016-05-12 DIAGNOSIS — J302 Other seasonal allergic rhinitis: Secondary | ICD-10-CM

## 2016-05-12 DIAGNOSIS — F419 Anxiety disorder, unspecified: Secondary | ICD-10-CM

## 2016-05-12 DIAGNOSIS — D225 Melanocytic nevi of trunk: Secondary | ICD-10-CM | POA: Diagnosis not present

## 2016-05-12 DIAGNOSIS — L570 Actinic keratosis: Secondary | ICD-10-CM | POA: Diagnosis not present

## 2016-05-12 MED ORDER — ALPRAZOLAM 0.25 MG PO TABS: 0.2500 mg | ORAL_TABLET | Freq: Two times a day (BID) | ORAL | Status: DC | PRN

## 2016-05-12 MED ORDER — NEBIVOLOL HCL 10 MG PO TABS: 10.0000 mg | ORAL_TABLET | Freq: Every day | ORAL | Status: DC

## 2016-05-12 MED ORDER — LORATADINE 10 MG PO TABS: 10.0000 mg | ORAL_TABLET | Freq: Every day | ORAL | Status: DC

## 2016-05-12 MED ORDER — ROSUVASTATIN CALCIUM 20 MG PO TABS: 20.0000 mg | ORAL_TABLET | Freq: Every day | ORAL | Status: DC

## 2016-05-12 NOTE — Progress Notes (Signed)
   Subjective:    Patient ID: Rita Wells, female    DOB: 27-Feb-1933, 82 y.o.   MRN: [id number]  HPI 82 year old female who is here to follow-up hypothyroidism, hypertension, hyperlipidemia, and general anxiety. She has had some issues with pollen this spring but does not feel like she has an infection. She does produce yellow sputum from time to time. She is on thyroid replacement. That was last checked last month and was at goal so will continue same dose of replacement until recheck in one year.  Patient Active Problem List   Diagnosis Date Noted  . Hypothyroidism 07/03/2015  . Toxic goiter   . Acute maxillary sinusitis 10/18/2014  . Hypertension 04/19/2014  . Hyperlipidemia 04/19/2014  . Generalized anxiety disorder 04/19/2014  . Insomnia 04/19/2014   Outpatient Encounter Prescriptions as of 05/12/2016  Medication Sig  . ALPRAZolam (XANAX) 0.25 MG tablet Take 1 tablet (0.25 mg total) by mouth 2 (two) times daily as needed for anxiety.  Marland Kitchen levothyroxine (SYNTHROID, LEVOTHROID) 75 MCG tablet Take 1 tablet (75 mcg total) by mouth daily before breakfast.  . nebivolol (BYSTOLIC) 10 MG tablet Take 1 tablet (10 mg total) by mouth daily.  . pantoprazole (PROTONIX) 40 MG tablet Take 1 tablet (40 mg total) by mouth daily.  . rosuvastatin (CRESTOR) 20 MG tablet Take 1 tablet (20 mg total) by mouth daily.  . [DISCONTINUED] HYDROcodone-homatropine (HYCODAN) 5-1.5 MG/5ML syrup Take 5 mLs by mouth every 8 (eight) hours as needed for cough.  . loratadine (CLARITIN) 10 MG tablet Take 1 tablet (10 mg total) by mouth daily. (Patient not taking: Reported on 05/12/2016)  . [DISCONTINUED] traZODone (DESYREL) 50 MG tablet Take 2 tablets (100 mg total) by mouth at bedtime as needed for sleep.   No facility-administered encounter medications on file as of 05/12/2016.      Review of Systems  Constitutional: Negative.   HENT: Positive for congestion, postnasal drip and sore throat.   Eyes: Negative.     Respiratory: Negative.   Cardiovascular: Negative.   Neurological: Negative.        Objective:   Physical Exam  Constitutional: She is oriented to person, place, and time. She appears well-developed and well-nourished.  Cardiovascular: Normal rate, regular rhythm and normal heart sounds.   Pulmonary/Chest: Effort normal and breath sounds normal.  Abdominal: Soft.  Neurological: She is alert and oriented to person, place, and time.  Psychiatric: She has a normal mood and affect.          Assessment & Plan:  1. Essential hypertension Blood pressure is well controlled although it's up a little bit this morning. - nebivolol (BYSTOLIC) 10 MG tablet; Take 1 tablet (10 mg total) by mouth daily.  Dispense: 90 tablet; Refill: 1  2. Seasonal allergies Suggested switching to a different antihistamine for a while she probably has some tolerance to - loratadine (CLARITIN) 10 MG tablet; Take 1 tablet (10 mg total) by mouth daily.  Dispense: 30 tablet; Refill: 2  3. Anxiety Does 5 with alprazolam - ALPRAZolam (XANAX) 0.25 MG tablet; Take 1 tablet (0.25 mg total) by mouth 2 (two) times daily as needed for anxiety.  Dispense: 60 tablet; Refill: 1  4. Hypothyroidism, unspecified hypothyroidism type Her endocrinologist has turned her care over to this practice will follow TSH as she is on thyroid replacement after instruction of her thyroid  Wardell Honour MD

## 2016-05-18 ENCOUNTER — Encounter (INDEPENDENT_AMBULATORY_CARE_PROVIDER_SITE_OTHER): Payer: Self-pay

## 2016-08-13 ENCOUNTER — Encounter: Payer: Self-pay | Admitting: Family Medicine

## 2016-08-13 ENCOUNTER — Ambulatory Visit (INDEPENDENT_AMBULATORY_CARE_PROVIDER_SITE_OTHER): Payer: Commercial Managed Care - HMO | Admitting: Family Medicine

## 2016-08-13 VITALS — BP 142/84 | HR 60 | Temp 97.0°F | Ht 63.0 in | Wt 152.8 lb

## 2016-08-13 DIAGNOSIS — E785 Hyperlipidemia, unspecified: Secondary | ICD-10-CM

## 2016-08-13 DIAGNOSIS — I10 Essential (primary) hypertension: Secondary | ICD-10-CM | POA: Diagnosis not present

## 2016-08-13 DIAGNOSIS — E039 Hypothyroidism, unspecified: Secondary | ICD-10-CM | POA: Diagnosis not present

## 2016-08-13 DIAGNOSIS — R5383 Other fatigue: Secondary | ICD-10-CM

## 2016-08-13 NOTE — Progress Notes (Signed)
   Subjective:    Patient ID: Rita Wells, female    DOB: August 14, 1933, 82 y.o.   MRN: [id number]  HPI 82 year old female here to follow-up blood pressure and lipids and thyroid disease. She is status post radioactive iodine treatment for hyperthyroidism. She formally been followed by endocrinology who released her. Blood pressure today is 142/84. She does complain of some malaise and fatigue recently and wonders about her thyroid. TSH was checked 4 months ago so that's not likely but I think we probably need to look at her potassium and hemoglobin as well.  Patient Active Problem List   Diagnosis Date Noted  . Hypothyroidism 07/03/2015  . Toxic goiter   . Acute maxillary sinusitis 10/18/2014  . Hypertension 04/19/2014  . Hyperlipidemia 04/19/2014  . Generalized anxiety disorder 04/19/2014  . Insomnia 04/19/2014   Outpatient Encounter Prescriptions as of 08/13/2016  Medication Sig  . ALPRAZolam (XANAX) 0.25 MG tablet Take 1 tablet (0.25 mg total) by mouth 2 (two) times daily as needed for anxiety.  Marland Kitchen levothyroxine (SYNTHROID, LEVOTHROID) 75 MCG tablet Take 1 tablet (75 mcg total) by mouth daily before breakfast.  . loratadine (CLARITIN) 10 MG tablet Take 1 tablet (10 mg total) by mouth daily.  . nebivolol (BYSTOLIC) 10 MG tablet Take 1 tablet (10 mg total) by mouth daily.  . pantoprazole (PROTONIX) 40 MG tablet Take 1 tablet (40 mg total) by mouth daily.  . rosuvastatin (CRESTOR) 20 MG tablet Take 1 tablet (20 mg total) by mouth daily.   No facility-administered encounter medications on file as of 08/13/2016.       Review of Systems  Constitutional: Positive for fatigue.  Respiratory: Negative.   Cardiovascular: Negative.   Gastrointestinal: Negative.   Neurological: Negative.   Psychiatric/Behavioral: Negative.        Objective:   Physical Exam  Constitutional: She is oriented to person, place, and time. She appears well-developed and well-nourished.  Cardiovascular:  Normal rate, regular rhythm and normal heart sounds.   Pulmonary/Chest: Effort normal and breath sounds normal.  Neurological: She is alert and oriented to person, place, and time.  Psychiatric: She has a normal mood and affect.   BP (!) 142/84   Pulse 60   Temp 97 F (36.1 C) (Oral)   Ht '5\' 3"'$  (1.6 m)   Wt 152 lb 12.8 oz (69.3 kg)   BMI 27.07 kg/m         Assessment & Plan:  1. Essential hypertension Blood pressure is okay on Bystolic. No changes are needed - CMP14+EGFR  2. Hyperlipidemia Lipids were last checked 6 months ago and were at goal with LDL of 91 - Lipid panel  3. Hypothyroidism, unspecified hypothyroidism type We'll repeat TSH today given her symptoms of fatigue - Thyroid Panel With TSH  4. Other fatigue Check CBC and potassium. She says this may be related to the hot weather and that would be a possible source of her symptoms   Wardell Honour MD - CBC with Differential/Platelet

## 2016-08-14 LAB — CMP14+EGFR
ALBUMIN: 4.1 g/dL (ref 3.5–4.7)
ALT: 10 IU/L (ref 0–32)
AST: 16 IU/L (ref 0–40)
Albumin/Globulin Ratio: 1.9 (ref 1.2–2.2)
Alkaline Phosphatase: 83 IU/L (ref 39–117)
BILIRUBIN TOTAL: 0.4 mg/dL (ref 0.0–1.2)
BUN / CREAT RATIO: 15 (ref 12–28)
BUN: 19 mg/dL (ref 8–27)
CALCIUM: 9.2 mg/dL (ref 8.7–10.3)
CHLORIDE: 102 mmol/L (ref 96–106)
CO2: 21 mmol/L (ref 18–29)
CREATININE: 1.27 mg/dL — AB (ref 0.57–1.00)
GFR calc non Af Amer: 40 mL/min/{1.73_m2} — ABNORMAL LOW (ref >59)
GFR, EST AFRICAN AMERICAN: 46 mL/min/{1.73_m2} — AB (ref >59)
GLUCOSE: 96 mg/dL (ref 65–99)
Globulin, Total: 2.2 g/dL (ref 1.5–4.5)
Potassium: 4.6 mmol/L (ref 3.5–5.2)
Sodium: 140 mmol/L (ref 134–144)
TOTAL PROTEIN: 6.3 g/dL (ref 6.0–8.5)

## 2016-08-14 LAB — CBC WITH DIFFERENTIAL/PLATELET
Basophils Absolute: 0 x10E3/uL (ref 0.0–0.2)
Basos: 0 %
EOS (ABSOLUTE): 0.4 x10E3/uL (ref 0.0–0.4)
Eos: 5 %
Hematocrit: 39.2 % (ref 34.0–46.6)
Hemoglobin: 13.4 g/dL (ref 11.1–15.9)
Immature Grans (Abs): 0 x10E3/uL (ref 0.0–0.1)
Immature Granulocytes: 0 %
Lymphocytes Absolute: 1.7 x10E3/uL (ref 0.7–3.1)
Lymphs: 24 %
MCH: 28.6 pg (ref 26.6–33.0)
MCHC: 34.2 g/dL (ref 31.5–35.7)
MCV: 84 fL (ref 79–97)
Monocytes Absolute: 0.7 x10E3/uL (ref 0.1–0.9)
Monocytes: 9 %
Neutrophils Absolute: 4.4 x10E3/uL (ref 1.4–7.0)
Neutrophils: 62 %
Platelets: 211 x10E3/uL (ref 150–379)
RBC: 4.69 x10E6/uL (ref 3.77–5.28)
RDW: 14.1 % (ref 12.3–15.4)
WBC: 7.2 x10E3/uL (ref 3.4–10.8)

## 2016-08-14 LAB — LIPID PANEL
Chol/HDL Ratio: 3.2 ratio (ref 0.0–4.4)
Cholesterol, Total: 157 mg/dL (ref 100–199)
HDL: 49 mg/dL (ref >39)
LDL Calculated: 77 mg/dL (ref 0–99)
Triglycerides: 154 mg/dL — ABNORMAL HIGH (ref 0–149)
VLDL Cholesterol Cal: 31 mg/dL (ref 5–40)

## 2016-08-14 LAB — THYROID PANEL WITH TSH
FREE THYROXINE INDEX: 2.4 (ref 1.2–4.9)
T3 Uptake Ratio: 26 % (ref 24–39)
T4, Total: 9.3 ug/dL (ref 4.5–12.0)
TSH: 1.83 u[IU]/mL (ref 0.450–4.500)

## 2016-09-14 ENCOUNTER — Other Ambulatory Visit: Payer: Self-pay

## 2016-09-14 DIAGNOSIS — F419 Anxiety disorder, unspecified: Secondary | ICD-10-CM

## 2016-09-15 MED ORDER — ALPRAZOLAM 0.25 MG PO TABS: 0.2500 mg | ORAL_TABLET | Freq: Two times a day (BID) | ORAL | 1 refills | Status: DC | PRN

## 2016-09-15 NOTE — Telephone Encounter (Signed)
Rx called into pharmacy and left detailed message on voicemail that requested rx was called in and to call back with any further questions or concerns.

## 2016-11-25 ENCOUNTER — Ambulatory Visit (INDEPENDENT_AMBULATORY_CARE_PROVIDER_SITE_OTHER): Payer: Commercial Managed Care - HMO | Admitting: Family Medicine

## 2016-11-25 ENCOUNTER — Encounter: Payer: Self-pay | Admitting: Family Medicine

## 2016-11-25 VITALS — BP 139/94 | HR 74 | Temp 96.9°F | Ht 63.0 in | Wt 155.0 lb

## 2016-11-25 DIAGNOSIS — E039 Hypothyroidism, unspecified: Secondary | ICD-10-CM | POA: Diagnosis not present

## 2016-11-25 DIAGNOSIS — I10 Essential (primary) hypertension: Secondary | ICD-10-CM

## 2016-11-25 DIAGNOSIS — E784 Other hyperlipidemia: Secondary | ICD-10-CM | POA: Diagnosis not present

## 2016-11-25 DIAGNOSIS — Z23 Encounter for immunization: Secondary | ICD-10-CM

## 2016-11-25 DIAGNOSIS — E7849 Other hyperlipidemia: Secondary | ICD-10-CM

## 2016-11-25 NOTE — Progress Notes (Signed)
   Subjective:    Patient ID: MERCADIES CO, female    DOB: [birth year], 82 y.o.   MRN: [id number]  HPI 83 year old female with hypertension, hyperlipidemia, and anxiety. She also has hypothyroidism and was formally followed by endocrinology but her care has been turned over to [place]. She has no complaints today. Her blood pressure continues to be borderline but at her age I would not be too aggressive in trying to lower it more.  Patient Active Problem List   Diagnosis Date Noted  . Hypothyroidism 07/03/2015  . Toxic goiter   . Acute maxillary sinusitis 10/18/2014  . Hypertension 04/19/2014  . Hyperlipidemia 04/19/2014  . Generalized anxiety disorder 04/19/2014  . Insomnia 04/19/2014   Outpatient Encounter Prescriptions as of 11/25/2016  Medication Sig  . ALPRAZolam (XANAX) 0.25 MG tablet Take 1 tablet (0.25 mg total) by mouth 2 (two) times daily as needed for anxiety.  Marland Kitchen levothyroxine (SYNTHROID, LEVOTHROID) 75 MCG tablet Take 1 tablet (75 mcg total) by mouth daily before breakfast.  . loratadine (CLARITIN) 10 MG tablet Take 1 tablet (10 mg total) by mouth daily.  . nebivolol (BYSTOLIC) 10 MG tablet Take 1 tablet (10 mg total) by mouth daily.  . pantoprazole (PROTONIX) 40 MG tablet Take 1 tablet (40 mg total) by mouth daily.  . rosuvastatin (CRESTOR) 20 MG tablet Take 1 tablet (20 mg total) by mouth daily.   No facility-administered encounter medications on file as of 11/25/2016.       Review of Systems  Constitutional: Negative.   Respiratory: Negative.   Cardiovascular: Negative.   Gastrointestinal: Negative.   Genitourinary: Negative.   Neurological: Negative.   Psychiatric/Behavioral: Negative.        Objective:   Physical Exam  Constitutional: She is oriented to person, place, and time. She appears well-developed and well-nourished.  Cardiovascular: Normal rate, regular rhythm and normal heart sounds.   Pulmonary/Chest: Effort normal and breath sounds normal.    Neurological: She is alert and oriented to person, place, and time.  Psychiatric: She has a normal mood and affect. Her behavior is normal. Thought content normal.   BP (!) 139/94   Pulse 74   Temp (!) 96.9 F (36.1 C) (Oral)   Ht 5\' 3"  (1.6 m)   Wt 155 lb (70.3 kg)   BMI 27.46 kg/m         Assessment & Plan:  1. Essential hypertension Blood pressure is borderline controlled we discussed addition of another agent but opted to follow for now  2. Hypothyroidism, unspecified type TSH was therapeutic when checked 3 months ago. Plan to recheck yearly  3. Other hyperlipidemia Lipids were at goal when rechecked 3 months ago. Also plan to recheck lipids q year.  Wardell Honour MD

## 2016-12-10 ENCOUNTER — Other Ambulatory Visit: Payer: Self-pay | Admitting: *Deleted

## 2016-12-10 DIAGNOSIS — F419 Anxiety disorder, unspecified: Secondary | ICD-10-CM

## 2016-12-10 MED ORDER — ALPRAZOLAM 0.25 MG PO TABS: 0.2500 mg | ORAL_TABLET | Freq: Two times a day (BID) | ORAL | 1 refills | Status: DC | PRN

## 2016-12-10 NOTE — Telephone Encounter (Signed)
[facility] patient last filled 10/14/2016. Last seen 11/25/2016. Please review and if approved please route to pool and have nurse call into pharmacy.

## 2017-01-20 ENCOUNTER — Other Ambulatory Visit: Payer: Self-pay | Admitting: Family Medicine

## 2017-01-20 DIAGNOSIS — I10 Essential (primary) hypertension: Secondary | ICD-10-CM

## 2017-01-25 ENCOUNTER — Other Ambulatory Visit: Payer: Self-pay | Admitting: Family Medicine

## 2017-01-25 ENCOUNTER — Telehealth: Payer: Self-pay | Admitting: Family Medicine

## 2017-01-25 NOTE — Telephone Encounter (Signed)
Please review and advise.

## 2017-01-28 MED ORDER — BISOPROLOL-HYDROCHLOROTHIAZIDE 10-6.25 MG PO TABS: 1.0000 | ORAL_TABLET | Freq: Every day | ORAL | 0 refills | Status: DC

## 2017-01-28 NOTE — Telephone Encounter (Signed)
Patient is going to call pharmacy again to verify cost and give [place] a call back to let us know if she wants [place] to send in the ziac.

## 2017-01-28 NOTE — Telephone Encounter (Signed)
Patient would like to try ziac since bystolic will be to expensive. Rx sent to pharmacy.

## 2017-01-28 NOTE — Telephone Encounter (Signed)
Could try Ziac(generic) 10/6.25 qd

## 2017-02-25 ENCOUNTER — Ambulatory Visit: Payer: Commercial Managed Care - HMO | Admitting: Physician Assistant

## 2017-03-04 ENCOUNTER — Encounter: Payer: Self-pay | Admitting: Family Medicine

## 2017-03-04 ENCOUNTER — Ambulatory Visit (INDEPENDENT_AMBULATORY_CARE_PROVIDER_SITE_OTHER): Payer: Medicare HMO | Admitting: Family Medicine

## 2017-03-04 VITALS — BP 149/76 | HR 55 | Temp 96.9°F | Ht 63.0 in | Wt 155.0 lb

## 2017-03-04 DIAGNOSIS — K219 Gastro-esophageal reflux disease without esophagitis: Secondary | ICD-10-CM

## 2017-03-04 DIAGNOSIS — M5442 Lumbago with sciatica, left side: Secondary | ICD-10-CM

## 2017-03-04 MED ORDER — METHYLPREDNISOLONE ACETATE 80 MG/ML IJ SUSP
80.0000 mg | Freq: Once | INTRAMUSCULAR | Status: AC
  Administered 2017-03-04: 80 mg via INTRAMUSCULAR

## 2017-03-04 MED ORDER — PANTOPRAZOLE SODIUM 40 MG PO TBEC: 40.0000 mg | DELAYED_RELEASE_TABLET | Freq: Every day | ORAL | 3 refills | Status: DC

## 2017-03-04 NOTE — Progress Notes (Signed)
   Subjective:    Patient ID: Rita Wells, female    DOB: October 04, 1933, 83 y.o.   MRN: [id number]  HPI 2-3 night history of left-sided hip pain with radiation down the back of her leg. Patient has been unable to sleep due to pain. She was seen 3 years ago for similar pain on the right side and underwent physical therapy. There is been no history of fall or injury. Patient does have would tend to her basement frequently to feed a wood stove.  Patient Active Problem List   Diagnosis Date Noted  . Hypothyroidism 07/03/2015  . Toxic goiter   . Acute maxillary sinusitis 10/18/2014  . Hypertension 04/19/2014  . Hyperlipidemia 04/19/2014  . Generalized anxiety disorder 04/19/2014  . Insomnia 04/19/2014   Outpatient Encounter Prescriptions as of 03/04/2017  Medication Sig  . ALPRAZolam (XANAX) 0.25 MG tablet Take 1 tablet (0.25 mg total) by mouth 2 (two) times daily as needed for anxiety.  . bisoprolol-hydrochlorothiazide (ZIAC) 10-6.25 MG tablet Take 1 tablet by mouth daily.  Marland Kitchen levothyroxine (SYNTHROID, LEVOTHROID) 75 MCG tablet TAKE ONE TABLET BY MOUTH EVERY DAY BEFORE BREAKFAST  . loratadine (CLARITIN) 10 MG tablet Take 1 tablet (10 mg total) by mouth daily.  . pantoprazole (PROTONIX) 40 MG tablet Take 1 tablet (40 mg total) by mouth daily.  . rosuvastatin (CRESTOR) 20 MG tablet TAKE ONE TABLET BY MOUTH DAILY  . [DISCONTINUED] BYSTOLIC 10 MG tablet TAKE ONE TABLET BY MOUTH EVERY DAY   No facility-administered encounter medications on file as of 03/04/2017.       Review of Systems  Constitutional: Negative.   Respiratory: Negative.   Cardiovascular: Negative.   Genitourinary: Negative.   Musculoskeletal: Positive for back pain.       Objective:   Physical Exam  Constitutional: She appears well-developed and well-nourished.  Musculoskeletal:  Patient has normal range of motion and back. There is tenderness over the sciatic notch with palpation. She is able to walk on her heels and  toes normally. Deep tendon reflexes are symmetric bilaterally.   BP (!) 149/76   Pulse (!) 55   Temp (!) 96.9 F (36.1 C) (Oral)   Ht 5\' 3"  (1.6 m)   Wt 155 lb (70.3 kg)   BMI 27.46 kg/m         Assessment & Plan:  1. Gastroesophageal reflux disease, esophagitis presence not specified  - pantoprazole (PROTONIX) 40 MG tablet; Take 1 tablet (40 mg total) by mouth daily.  Dispense: 30 tablet; Refill: 3  2. Acute left-sided low back pain with left-sided sciatica We'll try on injection. Patient has underdosed herself with Aleve or Advil. Discussed proper dosing. If symptoms aren't improved next week consider some sort of imaging study  Wardell Honour MD - methylPREDNISolone acetate (DEPO-MEDROL) injection 80 mg; Inject 1 mL (80 mg total) into the muscle once.

## 2017-03-09 ENCOUNTER — Telehealth: Payer: Self-pay | Admitting: Family Medicine

## 2017-03-09 NOTE — Telephone Encounter (Signed)
Line busy x 2 jkp 3/13

## 2017-03-09 NOTE — Telephone Encounter (Signed)
What symptoms do you have? Sciatic nerve pain  How long have you been sick? About 5 days  Have you been seen for this problem? Seen last Thursday and still not better wants to know if she can get something to help with pain since shot did not work  If your provider decides to give you a prescription, which pharmacy would you like for it to be sent to? [facility]   Patient informed that this information will be sent to the clinical staff for review and that they should receive a follow up call.

## 2017-03-09 NOTE — Telephone Encounter (Signed)
Patient was seen on 03/04/17 by Dr. Sabra Heck for low back pain radiating into back of left leg.  He gave her an injection of Depo Medrol 80 mg which has not helped at all.  Patient would like to know if we can call in something else.  Please advise.

## 2017-03-09 NOTE — Telephone Encounter (Signed)
Covering for PCP  Per his note, if not improved would consider imaging.   Recommend follow up.  Laroy Apple, MD [facility] 03/09/2017, 3:02 PM

## 2017-03-12 ENCOUNTER — Encounter: Payer: Self-pay | Admitting: Family Medicine

## 2017-03-12 ENCOUNTER — Ambulatory Visit (INDEPENDENT_AMBULATORY_CARE_PROVIDER_SITE_OTHER): Payer: Medicare HMO | Admitting: Family Medicine

## 2017-03-12 VITALS — BP 156/79 | HR 59 | Temp 97.4°F | Ht 63.0 in | Wt 154.0 lb

## 2017-03-12 DIAGNOSIS — M5442 Lumbago with sciatica, left side: Secondary | ICD-10-CM

## 2017-03-12 DIAGNOSIS — F419 Anxiety disorder, unspecified: Secondary | ICD-10-CM | POA: Diagnosis not present

## 2017-03-12 MED ORDER — HYDROCODONE-ACETAMINOPHEN 5-325 MG PO TABS: 0.5000 | ORAL_TABLET | Freq: Two times a day (BID) | ORAL | 0 refills | Status: DC | PRN

## 2017-03-12 MED ORDER — ALPRAZOLAM 0.25 MG PO TABS: 0.2500 mg | ORAL_TABLET | Freq: Two times a day (BID) | ORAL | 1 refills | Status: DC | PRN

## 2017-03-12 NOTE — Addendum Note (Signed)
Addended by: Zannie Cove on: 03/12/2017 03:49 PM   Modules accepted: Orders

## 2017-03-12 NOTE — Progress Notes (Signed)
   Subjective:    Patient ID: Rita Wells, female    DOB: 11-08-1933, 83 y.o.   MRN: [id number]  HPI Patient here today for 1 week follow up on Lower left side back pain. She was given a steroid injection last week and I think that helped some but she still having some symptoms. This likely represents some arthritis but could be disc disease. Before we can send her for a scan I would like to try some conservative treatment. She has done physical therapy before and seems agreeable to trying that again.    Patient Active Problem List   Diagnosis Date Noted  . Hypothyroidism 07/03/2015  . Toxic goiter   . Acute maxillary sinusitis 10/18/2014  . Hypertension 04/19/2014  . Hyperlipidemia 04/19/2014  . Generalized anxiety disorder 04/19/2014  . Insomnia 04/19/2014   Outpatient Encounter Prescriptions as of 03/12/2017  Medication Sig  . ALPRAZolam (XANAX) 0.25 MG tablet Take 1 tablet (0.25 mg total) by mouth 2 (two) times daily as needed for anxiety.  . bisoprolol-hydrochlorothiazide (ZIAC) 10-6.25 MG tablet Take 1 tablet by mouth daily.  Marland Kitchen levothyroxine (SYNTHROID, LEVOTHROID) 75 MCG tablet TAKE ONE TABLET BY MOUTH EVERY DAY BEFORE BREAKFAST  . loratadine (CLARITIN) 10 MG tablet Take 1 tablet (10 mg total) by mouth daily.  . pantoprazole (PROTONIX) 40 MG tablet Take 1 tablet (40 mg total) by mouth daily.  . rosuvastatin (CRESTOR) 20 MG tablet TAKE ONE TABLET BY MOUTH DAILY   No facility-administered encounter medications on file as of 03/12/2017.       Review of Systems  Constitutional: Negative.   HENT: Negative.   Eyes: Negative.   Respiratory: Negative.   Cardiovascular: Negative.   Gastrointestinal: Negative.   Endocrine: Negative.   Genitourinary: Negative.   Musculoskeletal: Positive for back pain (lower left side).  Skin: Negative.   Allergic/Immunologic: Negative.   Neurological: Negative.   Hematological: Negative.   Psychiatric/Behavioral: Negative.          Objective:   Physical Exam  Constitutional: She appears well-developed and well-nourished.  Musculoskeletal:  Back: Pain seems to be higher in left hip and area of SI joint. Straight leg raising is negative. Reflexes are symmetric comparing right to left.   BP (!) 156/79 (BP Location: Left Arm)   Pulse (!) 59   Temp 97.4 F (36.3 C) (Oral)   Ht 5\' 3"  (1.6 m)   Wt 154 lb (69.9 kg)   BMI 27.28 kg/m         Assessment & Plan:  1. Anxiety Takes Xanax mostly as bedtime relaxant. - ALPRAZolam (XANAX) 0.25 MG tablet; Take 1 tablet (0.25 mg total) by mouth 2 (two) times daily as needed for anxiety.  Dispense: 60 tablet; Refill: 1  2. Acute left-sided low back pain with left-sided sciatica We will schedule physical therapy. If symptoms are not relieved by therapy consider scan.  Wardell Honour MD

## 2017-03-23 NOTE — Telephone Encounter (Signed)
Patient had a follow up appointment scheduled.

## 2017-03-24 ENCOUNTER — Encounter: Payer: Self-pay | Admitting: Physician Assistant

## 2017-03-24 ENCOUNTER — Ambulatory Visit (INDEPENDENT_AMBULATORY_CARE_PROVIDER_SITE_OTHER): Payer: Medicare HMO | Admitting: Physician Assistant

## 2017-03-24 VITALS — BP 163/84 | HR 57 | Temp 97.6°F | Ht 63.0 in | Wt 152.4 lb

## 2017-03-24 DIAGNOSIS — E0500 Thyrotoxicosis with diffuse goiter without thyrotoxic crisis or storm: Secondary | ICD-10-CM | POA: Diagnosis not present

## 2017-03-24 DIAGNOSIS — J309 Allergic rhinitis, unspecified: Secondary | ICD-10-CM | POA: Diagnosis not present

## 2017-03-24 DIAGNOSIS — M5432 Sciatica, left side: Secondary | ICD-10-CM | POA: Diagnosis not present

## 2017-03-24 DIAGNOSIS — E039 Hypothyroidism, unspecified: Secondary | ICD-10-CM | POA: Diagnosis not present

## 2017-03-24 DIAGNOSIS — E784 Other hyperlipidemia: Secondary | ICD-10-CM

## 2017-03-24 DIAGNOSIS — E7849 Other hyperlipidemia: Secondary | ICD-10-CM

## 2017-03-24 DIAGNOSIS — I10 Essential (primary) hypertension: Secondary | ICD-10-CM

## 2017-03-24 MED ORDER — CETIRIZINE HCL 10 MG PO TABS: 10.0000 mg | ORAL_TABLET | Freq: Every day | ORAL | 11 refills | Status: DC

## 2017-03-24 MED ORDER — ROSUVASTATIN CALCIUM 20 MG PO TABS: 20.0000 mg | ORAL_TABLET | Freq: Every day | ORAL | 3 refills | Status: DC

## 2017-03-24 MED ORDER — METOPROLOL SUCCINATE ER 100 MG PO TB24: 100.0000 mg | ORAL_TABLET | Freq: Every day | ORAL | 3 refills | Status: DC

## 2017-03-24 NOTE — Progress Notes (Signed)
BP (!) 163/84   Pulse (!) 57   Temp 97.6 F (36.4 C) (Oral)   Ht 5' 3"  (1.6 m)   Wt 152 lb 6.4 oz (69.1 kg)   BMI 27.00 kg/m    Subjective:    Patient ID: Rita Wells, female    DOB: 1933/04/11, 83 y.o.   MRN: [id number]  HPI: FAYLINN SCHWENN is a 83 y.o. female presenting on 03/24/2017 for Hypertension (3 mos ckup and labwork); Hyperlipidemia; and Hypothyroidism  This patient comes in to be established from Dr. Sabra Heck. Her most pressing problems are hypertension, hyperlipidemia and hypothyroidism. She also has had chronic sciatica. The injection did help some but she would like to look at physical therapy. They had discussed referring her. She has known previous herniated disc that affected her right side. However this time she is having pain across her left low back and SI joint. At times it even bothers her down her leg onto the lateral portion of the lower leg. She has not had any weakness or foot drop.  Relevant past medical, surgical, family and social history reviewed and updated as indicated. Allergies and medications reviewed and updated.  Past Medical History:  Diagnosis Date  . Hyperlipidemia   . Hypertension   . Toxic goiter     Past Surgical History:  Procedure Laterality Date  . CERVICAL FUSION    . TUBAL LIGATION      Review of Systems  Constitutional: Negative.  Negative for activity change, fatigue and fever.  HENT: Negative.   Eyes: Negative.   Respiratory: Negative.  Negative for cough.   Cardiovascular: Negative.  Negative for chest pain.  Gastrointestinal: Negative.  Negative for abdominal pain.  Endocrine: Negative.   Genitourinary: Negative.  Negative for dysuria.  Musculoskeletal: Positive for arthralgias, back pain and myalgias. Negative for gait problem.  Skin: Negative.   Neurological: Negative.  Negative for weakness.    Allergies as of 03/24/2017      Reactions   Azor [amlodipine-olmesartan]       Medication List       Accurate  as of 03/24/17  1:44 PM. Always use your most recent med list.          ALPRAZolam 0.25 MG tablet Commonly known as:  XANAX Take 1 tablet (0.25 mg total) by mouth 2 (two) times daily as needed for anxiety.   bisoprolol-hydrochlorothiazide 10-6.25 MG tablet Commonly known as:  ZIAC Take 1 tablet by mouth daily.   cetirizine 10 MG tablet Commonly known as:  ZYRTEC Take 1 tablet (10 mg total) by mouth daily.   HYDROcodone-acetaminophen 5-325 MG tablet Commonly known as:  NORCO/VICODIN Take 0.5-1 tablets by mouth every 12 (twelve) hours as needed for moderate pain.   levothyroxine 75 MCG tablet Commonly known as:  SYNTHROID, LEVOTHROID TAKE ONE TABLET BY MOUTH EVERY DAY BEFORE BREAKFAST   metoprolol succinate 100 MG 24 hr tablet Commonly known as:  TOPROL-XL Take 1 tablet (100 mg total) by mouth daily. Take with or immediately following a meal.   pantoprazole 40 MG tablet Commonly known as:  PROTONIX Take 1 tablet (40 mg total) by mouth daily.   rosuvastatin 20 MG tablet Commonly known as:  CRESTOR Take 1 tablet (20 mg total) by mouth daily.          Objective:    BP (!) 163/84   Pulse (!) 57   Temp 97.6 F (36.4 C) (Oral)   Ht 5' 3"  (1.6 m)   Wt  152 lb 6.4 oz (69.1 kg)   BMI 27.00 kg/m   Allergies  Allergen Reactions  . Azor [Amlodipine-Olmesartan]     Physical Exam  Constitutional: She is oriented to person, place, and time. She appears well-developed and well-nourished.  HENT:  Head: Normocephalic and atraumatic.  Right Ear: Tympanic membrane, external ear and ear canal normal.  Left Ear: Tympanic membrane, external ear and ear canal normal.  Nose: Nose normal. No rhinorrhea.  Mouth/Throat: Oropharynx is clear and moist and mucous membranes are normal. No oropharyngeal exudate or posterior oropharyngeal erythema.  Eyes: Conjunctivae and EOM are normal. Pupils are equal, round, and reactive to light.  Neck: Normal range of motion. Neck supple.    Cardiovascular: Normal rate, regular rhythm, normal heart sounds and intact distal pulses.   Pulmonary/Chest: Effort normal and breath sounds normal.  Abdominal: Soft. Bowel sounds are normal.  Neurological: She is alert and oriented to person, place, and time. She has normal reflexes.  Skin: Skin is warm and dry. No rash noted.  Psychiatric: She has a normal mood and affect. Her behavior is normal. Judgment and thought content normal.  Nursing note and vitals reviewed.       Assessment & Plan:   1. Other hyperlipidemia - rosuvastatin (CRESTOR) 20 MG tablet; Take 1 tablet (20 mg total) by mouth daily.  Dispense: 90 tablet; Refill: 3 - CMP14+EGFR - Lipid panel  2. Toxic goiter - Thyroid Panel With TSH  3. Hypothyroidism, unspecified type - Thyroid Panel With TSH  4. Essential hypertension - CMP14+EGFR - CBC with Differential/Platelet - Lipid panel - metoprolol succinate (TOPROL-XL) 100 MG 24 hr tablet; Take 1 tablet (100 mg total) by mouth daily. Take with or immediately following a meal.  Dispense: 90 tablet; Refill: 3  5. Sciatica of left side - Ambulatory referral to Physical Therapy  6. Chronic allergic rhinitis, unspecified seasonality, unspecified trigger - cetirizine (ZYRTEC) 10 MG tablet; Take 1 tablet (10 mg total) by mouth daily.  Dispense: 30 tablet; Refill: 11   Continue all other maintenance medications as listed above.  Follow up plan: Return in about 3 months (around 06/24/2017) for recheck BP.  Educational handout given for Caro PA-C [facility] [address]  [place], [place] [address] [phone]   03/24/2017, 1:44 PM

## 2017-03-24 NOTE — Patient Instructions (Signed)
DASH Eating Plan DASH stands for "Dietary Approaches to Stop Hypertension." The DASH eating plan is a healthy eating plan that has been shown to reduce high blood pressure (hypertension). It may also reduce your risk for type 2 diabetes, heart disease, and stroke. The DASH eating plan may also help with weight loss. What are tips for following this plan? General guidelines  Avoid eating more than 2,300 mg (milligrams) of salt (sodium) a day. If you have hypertension, you may need to reduce your sodium intake to 1,500 mg a day.  Limit alcohol intake to no more than 1 drink a day for nonpregnant women and 2 drinks a day for men. One drink equals 12 oz of beer, 5 oz of wine, or 1 oz of hard liquor.  Work with your health care provider to maintain a healthy body weight or to lose weight. Ask what an ideal weight is for you.  Get at least 30 minutes of exercise that causes your heart to beat faster (aerobic exercise) most days of the week. Activities may include walking, swimming, or biking.  Work with your health care provider or diet and nutrition specialist (dietitian) to adjust your eating plan to your individual calorie needs. Reading food labels  Check food labels for the amount of sodium per serving. Choose foods with less than 5 percent of the Daily Value of sodium. Generally, foods with less than 300 mg of sodium per serving fit into this eating plan.  To find whole grains, look for the word "whole" as the first word in the ingredient list. Shopping  Buy products labeled as "low-sodium" or "no salt added."  Buy fresh foods. Avoid canned foods and premade or frozen meals. Cooking  Avoid adding salt when cooking. Use salt-free seasonings or herbs instead of table salt or sea salt. Check with your health care provider or pharmacist before using salt substitutes.  Do not fry foods. Cook foods using healthy methods such as baking, boiling, grilling, and broiling instead.  Cook with  heart-healthy oils, such as olive, canola, soybean, or sunflower oil. Meal planning   Eat a balanced diet that includes: ? 5 or more servings of fruits and vegetables each day. At each meal, try to fill half of your plate with fruits and vegetables. ? Up to 6-8 servings of whole grains each day. ? Less than 6 oz of lean meat, poultry, or fish each day. A 3-oz serving of meat is about the same size as a deck of cards. One egg equals 1 oz. ? 2 servings of low-fat dairy each day. ? A serving of nuts, seeds, or beans 5 times each week. ? Heart-healthy fats. Healthy fats called Omega-3 fatty acids are found in foods such as flaxseeds and coldwater fish, like sardines, salmon, and mackerel.  Limit how much you eat of the following: ? Canned or prepackaged foods. ? Food that is high in trans fat, such as fried foods. ? Food that is high in saturated fat, such as fatty meat. ? Sweets, desserts, sugary drinks, and other foods with added sugar. ? Full-fat dairy products.  Do not salt foods before eating.  Try to eat at least 2 vegetarian meals each week.  Eat more home-cooked food and less restaurant, buffet, and fast food.  When eating at a restaurant, ask that your food be prepared with less salt or no salt, if possible. What foods are recommended? The items listed may not be a complete list. Talk with your dietitian about what   dietary choices are best for you. Grains Whole-grain or whole-wheat bread. Whole-grain or whole-wheat pasta. Brown rice. Oatmeal. Quinoa. Bulgur. Whole-grain and low-sodium cereals. Pita bread. Low-fat, low-sodium crackers. Whole-wheat flour tortillas. Vegetables Fresh or frozen vegetables (raw, steamed, roasted, or grilled). Low-sodium or reduced-sodium tomato and vegetable juice. Low-sodium or reduced-sodium tomato sauce and tomato paste. Low-sodium or reduced-sodium canned vegetables. Fruits All fresh, dried, or frozen fruit. Canned fruit in natural juice (without  added sugar). Meat and other protein foods Skinless chicken or turkey. Ground chicken or turkey. Pork with fat trimmed off. Fish and seafood. Egg whites. Dried beans, peas, or lentils. Unsalted nuts, nut butters, and seeds. Unsalted canned beans. Lean cuts of beef with fat trimmed off. Low-sodium, lean deli meat. Dairy Low-fat (1%) or fat-free (skim) milk. Fat-free, low-fat, or reduced-fat cheeses. Nonfat, low-sodium ricotta or cottage cheese. Low-fat or nonfat yogurt. Low-fat, low-sodium cheese. Fats and oils Soft margarine without trans fats. Vegetable oil. Low-fat, reduced-fat, or light mayonnaise and salad dressings (reduced-sodium). Canola, safflower, olive, soybean, and sunflower oils. Avocado. Seasoning and other foods Herbs. Spices. Seasoning mixes without salt. Unsalted popcorn and pretzels. Fat-free sweets. What foods are not recommended? The items listed may not be a complete list. Talk with your dietitian about what dietary choices are best for you. Grains Baked goods made with fat, such as croissants, muffins, or some breads. Dry pasta or rice meal packs. Vegetables Creamed or fried vegetables. Vegetables in a cheese sauce. Regular canned vegetables (not low-sodium or reduced-sodium). Regular canned tomato sauce and paste (not low-sodium or reduced-sodium). Regular tomato and vegetable juice (not low-sodium or reduced-sodium). Pickles. Olives. Fruits Canned fruit in a light or heavy syrup. Fried fruit. Fruit in cream or butter sauce. Meat and other protein foods Fatty cuts of meat. Ribs. Fried meat. Bacon. Sausage. Bologna and other processed lunch meats. Salami. Fatback. Hotdogs. Bratwurst. Salted nuts and seeds. Canned beans with added salt. Canned or smoked fish. Whole eggs or egg yolks. Chicken or turkey with skin. Dairy Whole or 2% milk, cream, and half-and-half. Whole or full-fat cream cheese. Whole-fat or sweetened yogurt. Full-fat cheese. Nondairy creamers. Whipped toppings.  Processed cheese and cheese spreads. Fats and oils Butter. Stick margarine. Lard. Shortening. Ghee. Bacon fat. Tropical oils, such as coconut, palm kernel, or palm oil. Seasoning and other foods Salted popcorn and pretzels. Onion salt, garlic salt, seasoned salt, table salt, and sea salt. Worcestershire sauce. Tartar sauce. Barbecue sauce. Teriyaki sauce. Soy sauce, including reduced-sodium. Steak sauce. Canned and packaged gravies. Fish sauce. Oyster sauce. Cocktail sauce. Horseradish that you find on the shelf. Ketchup. Mustard. Meat flavorings and tenderizers. Bouillon cubes. Hot sauce and Tabasco sauce. Premade or packaged marinades. Premade or packaged taco seasonings. Relishes. Regular salad dressings. Where to find more information:  National Heart, Lung, and Blood Institute: www.nhlbi.nih.gov  American Heart Association: www.heart.org Summary  The DASH eating plan is a healthy eating plan that has been shown to reduce high blood pressure (hypertension). It may also reduce your risk for type 2 diabetes, heart disease, and stroke.  With the DASH eating plan, you should limit salt (sodium) intake to 2,300 mg a day. If you have hypertension, you may need to reduce your sodium intake to 1,500 mg a day.  When on the DASH eating plan, aim to eat more fresh fruits and vegetables, whole grains, lean proteins, low-fat dairy, and heart-healthy fats.  Work with your health care provider or diet and nutrition specialist (dietitian) to adjust your eating plan to your individual   calorie needs. This information is not intended to replace advice given to you by your health care provider. Make sure you discuss any questions you have with your health care provider. Document Released: 12/03/2011 Document Revised: 12/07/2016 Document Reviewed: 12/07/2016 Elsevier Interactive Patient Education  2017 Elsevier Inc.  

## 2017-03-25 LAB — CBC WITH DIFFERENTIAL/PLATELET
BASOS ABS: 0 10*3/uL (ref 0.0–0.2)
BASOS: 0 %
EOS (ABSOLUTE): 0.3 10*3/uL (ref 0.0–0.4)
Eos: 3 %
Hematocrit: 43.8 % (ref 34.0–46.6)
Hemoglobin: 14.5 g/dL (ref 11.1–15.9)
IMMATURE GRANS (ABS): 0 10*3/uL (ref 0.0–0.1)
Immature Granulocytes: 0 %
LYMPHS ABS: 1.7 10*3/uL (ref 0.7–3.1)
Lymphs: 17 %
MCH: 27.9 pg (ref 26.6–33.0)
MCHC: 33.1 g/dL (ref 31.5–35.7)
MCV: 84 fL (ref 79–97)
MONOS ABS: 0.8 10*3/uL (ref 0.1–0.9)
Monocytes: 8 %
Neutrophils Absolute: 7.3 10*3/uL — ABNORMAL HIGH (ref 1.4–7.0)
Neutrophils: 72 %
PLATELETS: 215 10*3/uL (ref 150–379)
RBC: 5.2 x10E6/uL (ref 3.77–5.28)
RDW: 15 % (ref 12.3–15.4)
WBC: 10 10*3/uL (ref 3.4–10.8)

## 2017-03-25 LAB — CMP14+EGFR
A/G RATIO: 1.8 (ref 1.2–2.2)
ALT: 16 IU/L (ref 0–32)
AST: 19 IU/L (ref 0–40)
Albumin: 4.2 g/dL (ref 3.5–4.7)
Alkaline Phosphatase: 88 IU/L (ref 39–117)
BILIRUBIN TOTAL: 0.5 mg/dL (ref 0.0–1.2)
BUN / CREAT RATIO: 17 (ref 12–28)
BUN: 20 mg/dL (ref 8–27)
CALCIUM: 9.3 mg/dL (ref 8.7–10.3)
CHLORIDE: 95 mmol/L — AB (ref 96–106)
CO2: 26 mmol/L (ref 18–29)
Creatinine, Ser: 1.19 mg/dL — ABNORMAL HIGH (ref 0.57–1.00)
GFR, EST AFRICAN AMERICAN: 49 mL/min/{1.73_m2} — AB (ref >59)
GFR, EST NON AFRICAN AMERICAN: 43 mL/min/{1.73_m2} — AB (ref >59)
GLOBULIN, TOTAL: 2.4 g/dL (ref 1.5–4.5)
Glucose: 85 mg/dL (ref 65–99)
POTASSIUM: 4.5 mmol/L (ref 3.5–5.2)
SODIUM: 137 mmol/L (ref 134–144)
TOTAL PROTEIN: 6.6 g/dL (ref 6.0–8.5)

## 2017-03-25 LAB — THYROID PANEL WITH TSH
Free Thyroxine Index: 3.1 (ref 1.2–4.9)
T3 UPTAKE RATIO: 30 % (ref 24–39)
T4, Total: 10.3 ug/dL (ref 4.5–12.0)
TSH: 2.18 u[IU]/mL (ref 0.450–4.500)

## 2017-03-25 LAB — LIPID PANEL
CHOL/HDL RATIO: 2.7 ratio (ref 0.0–4.4)
Cholesterol, Total: 172 mg/dL (ref 100–199)
HDL: 64 mg/dL (ref >39)
LDL CALC: 84 mg/dL (ref 0–99)
TRIGLYCERIDES: 118 mg/dL (ref 0–149)
VLDL Cholesterol Cal: 24 mg/dL (ref 5–40)

## 2017-04-01 ENCOUNTER — Encounter: Payer: Self-pay | Admitting: Physical Therapy

## 2017-04-01 ENCOUNTER — Ambulatory Visit: Payer: Medicare HMO | Attending: Physician Assistant | Admitting: Physical Therapy

## 2017-04-01 DIAGNOSIS — M6281 Muscle weakness (generalized): Secondary | ICD-10-CM

## 2017-04-01 DIAGNOSIS — M5442 Lumbago with sciatica, left side: Secondary | ICD-10-CM | POA: Insufficient documentation

## 2017-04-01 NOTE — Therapy (Signed)
[facility] [place], [place], [address] Phone: [phone]   Fax:  [phone]  Physical Therapy Evaluation  Patient Details  Name: Rita Wells MRN: [id number] Date of Birth: Oct 06, 1933 Referring Provider: Particia Nearing, PA  Encounter Date: 04/01/2017      PT End of Session - 04/01/17 1048    Visit Number 1   Number of Visits 16   Date for PT Re-Evaluation 06/01/17   Authorization Type KX modifier after 15 visits   PT Start Time 1030   PT Stop Time 1115   PT Time Calculation (min) 45 min   Activity Tolerance Patient tolerated treatment well   Behavior During Therapy [facility] for tasks assessed/performed      Past Medical History:  Diagnosis Date  . Hyperlipidemia   . Hypertension   . Toxic goiter     Past Surgical History:  Procedure Laterality Date  . CERVICAL FUSION    . TUBAL LIGATION      There were no vitals filed for this visit.       Subjective Assessment - 04/01/17 1040    Subjective Patient arriving to therapy today reporting 5/10 left sided low back pain which radiates down left LE to the knee. Pt reporting that she had a steroid injection on 03/14/17. Pt feels it helps  and she is now able to lay down at night and sleep. Pt reporting sitting makes the pain worse.    Limitations Sitting   How long can you sit comfortably? 5 minutes   How long can you stand comfortably? unlimited, pain gets better when standing   Patient Stated Goals Be able to sit down and ride without hurting   Currently in Pain? Yes   Pain Score 5    Pain Location Back   Pain Orientation Left   Pain Descriptors / Indicators Aching;Burning   Pain Type Acute pain   Pain Radiating Towards left knee   Pain Onset More than a month ago   Pain Frequency Intermittent  sitting makes it worse   Aggravating Factors  sitting   Pain Relieving Factors standing, walking   Effect of Pain on Daily Activities unable to sit or ride in a car  for long periods            [facility] PT Assessment - 04/01/17 0001      Assessment   Medical Diagnosis left sided low back pain with sciatica   Referring Provider Particia Nearing, PA   Onset Date/Surgical Date 02/25/17   Hand Dominance Right   Next MD Visit 6 months   Prior Therapy yes several years ago for right bulging disc     Precautions   Precautions None     Balance Screen   Has the patient fallen in the past 6 months No     [place] residence   Living Arrangements Alone   Available Help at Discharge Family   Type of [place] to enter   Entrance Stairs-Number of Steps 3   Home Layout One level;Laundry or work area in basement     Prior Function   Level of Independence Independent   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     Observation/Other Assessments   Focus on Therapeutic Outcomes (FOTO)  37% limitation     ROM / Strength   AROM / PROM /  Strength Strength     Strength   Strength Assessment Site Hip   Right/Left Hip Right;Left   Right Hip Flexion 4/5   Right Hip Extension 4/5   Right Hip ABduction 4/5   Right Hip ADduction 4/5   Left Hip Flexion 4/5   Left Hip Extension 4/5   Left Hip ABduction 4-/5   Left Hip ADduction 4-/5     Transfers   Five time sit to stand comments  24.5 seconds     Ambulation/Gait   Ambulation/Gait Yes   Ambulation/Gait Assistance 7: Independent   Ambulation Distance (Feet) 50 Feet   Gait Pattern Step-through pattern;Lateral trunk lean to right;Poor foot clearance - right  bilateral toe out   Ambulation Surface Level;Indoor     Balance   Balance Assessed Yes     Standardized Balance Assessment   Standardized Balance Assessment --  SLS: R=2 seconds, L=3 seconds                   [facility] Adult PT Treatment/Exercise - 04/01/17 0001      Exercises   Exercises Lumbar;Knee/Hip      Modalities   Modalities Electrical Stimulation;Moist Heat     Moist Heat Therapy   Number Minutes Moist Heat 15 Minutes   Moist Heat Location Lumbar Spine     Electrical Stimulation   Electrical Stimulation Location left sided lumbar paraspinals   Electrical Stimulation Action IFC   Electrical Stimulation Parameters 80-150 Hz x 15 minutes, intensity to pt's tolerance   Electrical Stimulation Goals Pain                PT Education - 04/01/17 1420    Education provided Yes   Education Details Discussed goal planning and traction for next visits, also discussed walking program   Person(s) Educated Patient   Methods Explanation   Comprehension Verbalized understanding             PT Long Term Goals - 04/01/17 1421      PT LONG TERM GOAL #1   Title Pt will improve her FOTO score from 37% limitation to 28% limitation in order to improve function.    Time 8   Period Weeks   Status New     PT LONG TERM GOAL #2   Title Pt will improve bilateral LE strength to >/= 4+/5 in order to improve functional mobility.    Time 8   Period Weeks   Status New     PT LONG TERM GOAL #3   Title Pt will be able to amb 30 minutes for communtiy amb.    Time 8   Period Weeks   Status New     PT LONG TERM GOAL #4   Title Pt will report pain </=3/10 with sit to stand transfers throughtout each day.    Time 8   Period Weeks   Status New               Plan - 04/01/17 1113    Clinical Impression Statement Pt arriving to therapy today as a low complexity evaluation complaining of left sided low back pain. Pt reporting having been here before a few years ago for right bulging disc and she beleived the electtrical stimulation and traction really helped. We discussed using traction at her next visit. Discussed walking program at home for endurance and LE strength building. Pt agreeing to a gradual walking program at home beginning with 5 minute walks and progressing to 30  minute  walks over the next 30 days. Pt also discussed spinal posture and alignment. E-stim applied today and pt reporting less pain at end of session. Skilled PT needed to address pt's impairments with the below interventions in order to improve pt's functional mobility and decrease pain.    Rehab Potential Good   PT Frequency 2x / week   PT Duration 8 weeks   PT Treatment/Interventions ADLs/Self Care Home Management;Dry needling;Taping;Cryotherapy;Electrical Stimulation;Iontophoresis 4mg /ml Dexamethasone;Moist Heat;Traction;Therapeutic exercise;Therapeutic activities;Functional mobility training;Stair training;Gait training;Patient/family education;Manual techniques;Passive range of motion   PT Next Visit Plan Traction, E-stim, manual as appropriate, gentle lumbar exercises   PT Home Exercise Plan Walking program   Consulted and Agree with Plan of Care Patient      Patient will benefit from skilled therapeutic intervention in order to improve the following deficits and impairments:  Abnormal gait, Pain, Decreased activity tolerance, Decreased strength, Decreased balance, Postural dysfunction  Visit Diagnosis: Acute left-sided low back pain with left-sided sciatica  Muscle weakness (generalized)      G-Codes - 2017/04/20 1423    Functional Assessment Tool Used (Outpatient Only) FOTO score and clinical judgement   Functional Limitation Mobility: Walking and moving around   Mobility: Walking and Moving Around Current Status ([id number]) At least 20 percent but less than 40 percent impaired, limited or restricted   Mobility: Walking and Moving Around Goal Status [phone]) At least 20 percent but less than 40 percent impaired, limited or restricted       Problem List Patient Active Problem List   Diagnosis Date Noted  . Sciatica of left side 03/24/2017  . Chronic allergic rhinitis 03/24/2017  . Hypothyroidism 07/03/2015  . Toxic goiter   . Acute maxillary sinusitis 10/18/2014  . Hypertension  04/19/2014  . Hyperlipidemia 04/19/2014  . Generalized anxiety disorder 04/19/2014  . Insomnia 04/19/2014    Oretha Caprice, MPT Apr 20, 2017, 2:24 PM  [facility] [place], [place], [address] Phone: [phone]   Fax:  [phone]  Name: PHYLISHA DIX MRN: [id number] Date of Birth: [birth year]

## 2017-04-06 ENCOUNTER — Ambulatory Visit: Payer: Medicare HMO | Admitting: *Deleted

## 2017-04-06 DIAGNOSIS — M6281 Muscle weakness (generalized): Secondary | ICD-10-CM

## 2017-04-06 DIAGNOSIS — M5442 Lumbago with sciatica, left side: Secondary | ICD-10-CM

## 2017-04-06 NOTE — Patient Instructions (Signed)
   http://orth.exer.us/656   Copyright  VHI. All rights reserved.  Piriformis (Supine)   Cross legs, right on top. Gently pull other knee toward chest until stretch is felt in buttock/hip of top leg. Hold _20___ seconds. Repeat _3-5___ times per set. Do __1-2__ sets per session. Do _2-3___ sessions per day.  http://orth.exer.us/676   Copyright  VHI. All rights reserved.  Stretching: Tensor   http://orth.exer.us/652   Copyright  VHI. All rights reserved.  Knee-to-Chest Stretch: Unilateral   With hand behind right knee, pull knee in to chest until a comfortable stretch is felt in lower back and buttocks. Keep back relaxed. Hold __20__ seconds. Repeat __3-5__ times per set. Do __1-2__ sets per session. Do __2-3__ sessions per day.  http://orth.exer.us/126   Copyright  VHI. All rights reserved.  Knee-to-Chest Stretch: Bilateral   http://orth.exer.us/128   Copyright  VHI. All rights reserved.

## 2017-04-06 NOTE — Therapy (Signed)
[facility] [place], [place], [address] Phone: [phone]   Fax:  [phone]  Physical Therapy Treatment  Patient Details  Name: Rita Wells MRN: [id number] Date of Birth: [birth year] Referring Provider: Particia Nearing, PA  Encounter Date: 04/06/2017      PT End of Session - 04/06/17 1132    Visit Number 2   Number of Visits 16   Date for PT Re-Evaluation 06/01/17   Authorization Type KX modifier after 15 visits   PT Start Time 1115   PT Stop Time 1205   PT Time Calculation (min) 50 min      Past Medical History:  Diagnosis Date  . Hyperlipidemia   . Hypertension   . Toxic goiter     Past Surgical History:  Procedure Laterality Date  . CERVICAL FUSION    . TUBAL LIGATION      There were no vitals filed for this visit.      Subjective Assessment - 04/06/17 1131    Subjective Patient arriving to therapy today reporting 5/10 left sided low back pain which radiates down left LE to the knee. Pt reporting that she had a steroid injection on 03/14/17. Pt feels it helps  and she is now able to lay down at night and sleep. Pt reporting sitting makes the pain worse.                          [place] Adult PT Treatment/Exercise - 04/06/17 0001      Exercises   Exercises Lumbar;Knee/Hip     Lumbar Exercises: Supine   Ab Set --   Other Supine Lumbar Exercises piriformis stretch x 5 hold 30 secs, SKTC x5 hold 30 secs     Modalities   Modalities Electrical Stimulation;Moist Heat;Traction     Moist Heat Therapy   Number Minutes Moist Heat 15 Minutes   Moist Heat Location Lumbar Spine     Electrical Stimulation   Electrical Stimulation Location left sided lumbar paraspinals, IFC 80-150hz , x 10 mins   Electrical Stimulation Goals Pain     Traction   Type of Traction Lumbar   Min (lbs) 5   Max (lbs) 60   Hold Time 99   Rest Time 5   Time 15                PT Education - 04/06/17  1335    Education provided Yes   Education Details SKTC, piriformis             PT Long Term Goals - 04/01/17 1421      PT LONG TERM GOAL #1   Title Pt will improve her FOTO score from 37% limitation to 28% limitation in order to improve function.    Time 8   Period Weeks   Status New     PT LONG TERM GOAL #2   Title Pt will improve bilateral LE strength to >/= 4+/5 in order to improve functional mobility.    Time 8   Period Weeks   Status New     PT LONG TERM GOAL #3   Title Pt will be able to amb 30 minutes for communtiy amb.    Time 8   Period Weeks   Status New     PT LONG TERM GOAL #4   Title Pt will report pain </=3/10 with sit to stand transfers throughtout each day.    Time 8  Period Weeks   Status New               Plan - 04/06/17 1124    Clinical Impression Statement Pt arrived today with LT LBP and LT LE pain. She did fairly well with SKTC  and piriformis stretch  but it caused a little soreness in LT hip. Pelvic traction was performed at 60 #s and was tolerated  well.   Rehab Potential Good   PT Frequency 2x / week   PT Duration 8 weeks   PT Treatment/Interventions ADLs/Self Care Home Management;Dry needling;Taping;Cryotherapy;Electrical Stimulation;Iontophoresis 4mg /ml Dexamethasone;Moist Heat;Traction;Therapeutic exercise;Therapeutic activities;Functional mobility training;Stair training;Gait training;Patient/family education;Manual techniques;Passive range of motion   PT Next Visit Plan Traction, E-stim, manual as appropriate, gentle lumbar exercises   PT Home Exercise Plan Walking program   Consulted and Agree with Plan of Care Patient      Patient will benefit from skilled therapeutic intervention in order to improve the following deficits and impairments:  Abnormal gait, Pain, Decreased activity tolerance, Decreased strength, Decreased balance, Postural dysfunction  Visit Diagnosis: Acute left-sided low back pain with left-sided  sciatica  Muscle weakness (generalized)     Problem List Patient Active Problem List   Diagnosis Date Noted  . Sciatica of left side 03/24/2017  . Chronic allergic rhinitis 03/24/2017  . Hypothyroidism 07/03/2015  . Toxic goiter   . Acute maxillary sinusitis 10/18/2014  . Hypertension 04/19/2014  . Hyperlipidemia 04/19/2014  . Generalized anxiety disorder 04/19/2014  . Insomnia 04/19/2014    Zailah Zagami,CHRIS, PTA 04/06/2017, 1:43 PM  [facility] [address] [place], [place], [address] Phone: [phone]   Fax:  [phone]  Name: Rita Wells MRN: [id number] Date of Birth: [birth year]

## 2017-04-08 ENCOUNTER — Ambulatory Visit: Payer: Medicare HMO | Admitting: Physical Therapy

## 2017-04-08 ENCOUNTER — Encounter: Payer: Self-pay | Admitting: Physical Therapy

## 2017-04-08 DIAGNOSIS — M5442 Lumbago with sciatica, left side: Secondary | ICD-10-CM | POA: Diagnosis not present

## 2017-04-08 DIAGNOSIS — M6281 Muscle weakness (generalized): Secondary | ICD-10-CM

## 2017-04-08 NOTE — Therapy (Addendum)
[facility] [place], [place], [address] Phone: [phone]   Fax:  [phone]  Physical Therapy Treatment  Patient Details  Name: Rita Wells MRN: [id number] Date of Birth: [birth year] Referring Provider: Particia Nearing, PA  Encounter Date: 04/08/2017      PT End of Session - 04/08/17 1140    Visit Number 3   Number of Visits 16   Date for PT Re-Evaluation 06/01/17   Authorization Type 1   PT Start Time 1115   PT Stop Time 1200   PT Time Calculation (min) 45 min   Activity Tolerance Patient tolerated treatment well   Behavior During Therapy [facility] for tasks assessed/performed      Past Medical History:  Diagnosis Date  . Hyperlipidemia   . Hypertension   . Toxic goiter     Past Surgical History:  Procedure Laterality Date  . CERVICAL FUSION    . TUBAL LIGATION      There were no vitals filed for this visit.      Subjective Assessment - 04/08/17 1136    Subjective Patient arriving to therapy today reporting 5/10 left sided low back pain. Pt reporting at times it radiates down left hip and leg into knee. Pt feels like traction helped after last visit. Pt tolerated exercises and stretching today. Continue with skilled PT as pt tolerates to progress toward goals set.    Pertinent History Steriod injection on 03/14/17   Limitations Sitting   How long can you sit comfortably? 5 minutes   How long can you stand comfortably? unlimited, pain gets better when standing   Patient Stated Goals Be able to sit down and ride without hurting   Pain Score 5    Pain Location Back   Pain Descriptors / Indicators Aching   Pain Type Acute pain   Pain Radiating Towards left knee   Pain Onset More than a month ago   Pain Frequency Intermittent   Aggravating Factors  sitting   Pain Relieving Factors stadning, walking,    Effect of Pain on Daily Activities difficutly with prolonged sitting, pt reporting since her injection she  is able to sleep                         [facility] Adult PT Treatment/Exercise - 04/08/17 0001      Exercises   Exercises Lumbar;Knee/Hip     Lumbar Exercises: Stretches   Piriformis Stretch 3 reps;30 seconds  both sides     Lumbar Exercises: Supine   Clam 10 reps;3 seconds   Bridge 10 reps;2 seconds     Knee/Hip Exercises: Aerobic   Nustep level 4 x 8 minutes     Modalities   Modalities Electrical Stimulation;Moist Heat;Traction     Moist Heat Therapy   Number Minutes Moist Heat 15 Minutes   Moist Heat Location Lumbar Spine     Electrical Stimulation   Electrical Stimulation Location left sided lumbar paraspinals, IFC 80-150hz , x 10 mins   Electrical Stimulation Goals Pain     Traction   Type of Traction Lumbar   Min (lbs) 5   Max (lbs) 65   Hold Time 99   Rest Time 5   Time 15                PT Education - 04/08/17 1139    Education provided Yes   Education Details Reviewed piriformis stretching   Person(s) Educated Patient  Methods Explanation;Demonstration   Comprehension Verbalized understanding;Returned demonstration          PT Short Term Goals - 04/08/17 1142      PT SHORT TERM GOAL #1   Title pt will be independent in her HEP.    Time 4   Period Weeks   Status New           PT Long Term Goals - 04/08/17 1142      PT LONG TERM GOAL #1   Title Pt will improve her FOTO score from 37% limitation to 28% limitation in order to improve function.    Time 8   Period Weeks   Status New     PT LONG TERM GOAL #2   Title Pt will improve bilateral LE strength to >/= 4+/5 in order to improve functional mobility.    Time 8   Period Weeks     PT LONG TERM GOAL #3   Title Pt will be able to amb 30 minutes for communtiy amb.    Time 8   Period Weeks   Status New     PT LONG TERM GOAL #4   Title Pt will report pain </=3/10 with sit to stand transfers throughtout each day.    Time 8   Status New                Plan - 04/08/17 1140    Clinical Impression Statement Patient arriving to therapy today reporting 5/10 left back pain. Pt tolerated Nustep and supine stretching, IFC and traction also performed which pt feels are helping the best. Pt toleraated 60# of machnical traction and reporting less pain at end of session. Continue with skilled PT to progress toward goals set.    Rehab Potential Good   PT Frequency 2x / week   PT Duration 8 weeks   PT Treatment/Interventions ADLs/Self Care Home Management;Dry needling;Taping;Cryotherapy;Electrical Stimulation;Iontophoresis 4mg /ml Dexamethasone;Moist Heat;Traction;Therapeutic exercise;Therapeutic activities;Functional mobility training;Stair training;Gait training;Patient/family education;Manual techniques;Passive range of motion   PT Next Visit Plan Traction, E-stim, manual as appropriate, gentle lumbar exercises   PT Home Exercise Plan Walking program   Consulted and Agree with Plan of Care Patient      Patient will benefit from skilled therapeutic intervention in order to improve the following deficits and impairments:  Abnormal gait, Pain, Decreased activity tolerance, Decreased strength, Decreased balance, Postural dysfunction  Visit Diagnosis: Acute left-sided low back pain with left-sided sciatica  Muscle weakness (generalized)     Problem List Patient Active Problem List   Diagnosis Date Noted  . Sciatica of left side 03/24/2017  . Chronic allergic rhinitis 03/24/2017  . Hypothyroidism 07/03/2015  . Toxic goiter   . Acute maxillary sinusitis 10/18/2014  . Hypertension 04/19/2014  . Hyperlipidemia 04/19/2014  . Generalized anxiety disorder 04/19/2014  . Insomnia 04/19/2014    Oretha Caprice, MPT  04/08/2017, 12:22 PM  [facility] [address] [place], [place], [address] Phone: [phone]   Fax:  [phone]  Name: Rita Wells MRN: [id number] Date of Birth: June 22, 1933

## 2017-04-13 ENCOUNTER — Ambulatory Visit: Payer: Medicare HMO | Admitting: Physical Therapy

## 2017-04-13 ENCOUNTER — Encounter: Payer: Self-pay | Admitting: Physical Therapy

## 2017-04-13 DIAGNOSIS — M6281 Muscle weakness (generalized): Secondary | ICD-10-CM | POA: Diagnosis not present

## 2017-04-13 DIAGNOSIS — M5442 Lumbago with sciatica, left side: Secondary | ICD-10-CM

## 2017-04-13 NOTE — Therapy (Signed)
[facility] [place], [place], [address] Phone: [phone]   Fax:  [phone]  Physical Therapy Treatment  Patient Details  Name: Rita Wells MRN: [id number] Date of Birth: [birth year] Referring Provider: Particia Nearing, PA  Encounter Date: 04/13/2017      PT End of Session - 04/13/17 1120    Visit Number 4   Number of Visits 16   Date for PT Re-Evaluation 06/01/17   PT Start Time 1122   PT Stop Time 1209   PT Time Calculation (min) 47 min   Activity Tolerance Patient tolerated treatment well   Behavior During Therapy [facility] for tasks assessed/performed      Past Medical History:  Diagnosis Date  . Hyperlipidemia   . Hypertension   . Toxic goiter     Past Surgical History:  Procedure Laterality Date  . CERVICAL FUSION    . TUBAL LIGATION      There were no vitals filed for this visit.      Subjective Assessment - 04/13/17 1119    Subjective Reports that her back is better and laying and sitting down is a lot better. No radiation down LEs.   Pertinent History Steriod injection on 03/14/17   Limitations Sitting   How long can you sit comfortably? 5 minutes   How long can you stand comfortably? unlimited, pain gets better when standing   Patient Stated Goals Be able to sit down and ride without hurting   Currently in Pain? No/denies            [facility] PT Assessment - 04/13/17 0001      Assessment   Medical Diagnosis left sided low back pain with sciatica   Onset Date/Surgical Date 02/25/17   Hand Dominance Right   Next MD Visit 6 months   Prior Therapy yes several years ago for right bulging disc     Precautions   Precautions None                     [facility] Adult PT Treatment/Exercise - 04/13/17 0001      Lumbar Exercises: Seated   Sit to Stand 20 reps     Lumbar Exercises: Supine   Ab Set 20 reps;2 seconds   Bent Knee Raise 15 reps;2 seconds   Bridge 20 reps   Straight Leg  Raise 20 reps   Straight Leg Raises Limitations BLE     Knee/Hip Exercises: Aerobic   Nustep L5 x7 min     Knee/Hip Exercises: Seated   Long Arc Quad Strengthening;Both;3 sets;10 reps;Weights   Long Arc Quad Weight 4 lbs.   Clamshell with TheraBand Green  3x10 reps   Hamstring Curl Strengthening;Both;2 sets;10 reps   Hamstring Limitations Green theraband     Modalities   Modalities Traction     Traction   Type of Traction Lumbar   Min (lbs) 5   Max (lbs) 70   Hold Time 99   Rest Time 5   Time 10                  PT Short Term Goals - 04/13/17 1121      PT SHORT TERM GOAL #1   Title pt will be independent in her HEP.    Time 4   Period Weeks   Status Achieved           PT Long Term Goals - 04/13/17 1121      PT LONG  TERM GOAL #1   Title Pt will improve her FOTO score from 37% limitation to 28% limitation in order to improve function.    Time 8   Period Weeks   Status On-going     PT LONG TERM GOAL #2   Title Pt will improve bilateral LE strength to >/= 4+/5 in order to improve functional mobility.    Time 8   Period Weeks   Status On-going     PT LONG TERM GOAL #3   Title Pt will be able to amb 30 minutes for communtiy amb.    Time 8   Period Weeks   Status Achieved     PT LONG TERM GOAL #4   Title Pt will report pain </=3/10 with sit to stand transfers throughtout each day.    Time 8   Status Achieved               Plan - 04/13/17 1208    Clinical Impression Statement Patient arrived to clinic with reports of great improvement regarding her pain with activities and had no current LBP. Patient guided through core and LE strengthening exercises. Patient educated regarding the correct technique for core activation which she was able to follow but unable to hold for prolonged period. Patient denied any stiffness after NuStep which followed the exercises. Traction max weight increased to 70# per MPT approval with no adverse symptoms  following treatment. Patient educated that she may experience soreness from therapeutic exercise or increase in traction.   Rehab Potential Good   PT Frequency 2x / week   PT Duration 8 weeks   PT Treatment/Interventions ADLs/Self Care Home Management;Dry needling;Taping;Cryotherapy;Electrical Stimulation;Iontophoresis 4mg /ml Dexamethasone;Moist Heat;Traction;Therapeutic exercise;Therapeutic activities;Functional mobility training;Stair training;Gait training;Patient/family education;Manual techniques;Passive range of motion   PT Next Visit Plan Traction, E-stim, manual as appropriate, gentle lumbar exercises   PT Home Exercise Plan Walking program   Consulted and Agree with Plan of Care Patient      Patient will benefit from skilled therapeutic intervention in order to improve the following deficits and impairments:  Abnormal gait, Pain, Decreased activity tolerance, Decreased strength, Decreased balance, Postural dysfunction  Visit Diagnosis: Acute left-sided low back pain with left-sided sciatica  Muscle weakness (generalized)     Problem List Patient Active Problem List   Diagnosis Date Noted  . Sciatica of left side 03/24/2017  . Chronic allergic rhinitis 03/24/2017  . Hypothyroidism 07/03/2015  . Toxic goiter   . Acute maxillary sinusitis 10/18/2014  . Hypertension 04/19/2014  . Hyperlipidemia 04/19/2014  . Generalized anxiety disorder 04/19/2014  . Insomnia 04/19/2014    Wynelle Fanny, PTA 04/13/2017, 12:20 PM  [facility] [address] [place], [place], [address] Phone: [phone]   Fax:  [phone]  Name: ARIAH MOWER MRN: [id number] Date of Birth: 28-May-1933

## 2017-04-15 ENCOUNTER — Ambulatory Visit: Payer: Medicare HMO | Admitting: Physical Therapy

## 2017-04-15 ENCOUNTER — Encounter: Payer: Self-pay | Admitting: Physical Therapy

## 2017-04-15 DIAGNOSIS — M6281 Muscle weakness (generalized): Secondary | ICD-10-CM

## 2017-04-15 DIAGNOSIS — M5442 Lumbago with sciatica, left side: Secondary | ICD-10-CM | POA: Diagnosis not present

## 2017-04-15 NOTE — Therapy (Addendum)
[facility] [place], [place], [address] Phone: [phone]   Fax:  [phone]  Physical Therapy Treatment  Patient Details  Name: Rita Wells MRN: [id number] Date of Birth: 04-Dec-1933 Referring Provider: Particia Nearing, PA  Encounter Date: 04/15/2017      PT End of Session - 04/15/17 1207    Visit Number 5   Number of Visits 16   Date for PT Re-Evaluation 06/01/17   PT Start Time 1115   PT Stop Time 1210   PT Time Calculation (min) 55 min   Activity Tolerance Patient tolerated treatment well   Behavior During Therapy [facility] for tasks assessed/performed      Past Medical History:  Diagnosis Date  . Hyperlipidemia   . Hypertension   . Toxic goiter     Past Surgical History:  Procedure Laterality Date  . CERVICAL FUSION    . TUBAL LIGATION      There were no vitals filed for this visit.      Subjective Assessment - 04/15/17 1206    Subjective Pt arriving to therapy reporting no pain.    Pertinent History Steriod injection on 03/14/17   Limitations Sitting   How long can you sit comfortably? 5 minutes   How long can you stand comfortably? unlimited, pain gets better when standing   Patient Stated Goals Be able to sit down and ride without hurting   Currently in Pain? No/denies                         [facility] Adult PT Treatment/Exercise - 04/15/17 0001      Exercises   Exercises Lumbar;Knee/Hip     Lumbar Exercises: Seated   Sit to Stand 20 reps     Lumbar Exercises: Supine   Ab Set 15 reps;2 seconds   Clam 20 reps;2 seconds;Other (comment)   Clam Limitations green theraband   Bridge 20 reps   Straight Leg Raise 20 reps  bilateral LE     Knee/Hip Exercises: Aerobic   Recumbent Bike level 1, 6 minutes     Knee/Hip Exercises: Seated   Long Arc Quad Strengthening;Both;3 sets;10 reps;Weights   Sit to [employer] 10 reps;without UE support     Knee/Hip Exercises: Sidelying   Hip  ABduction Strengthening;Both;2 sets;10 reps     Modalities   Modalities Traction     Traction   Type of Traction Lumbar   Min (lbs) 5   Max (lbs) 70   Hold Time 99   Rest Time 5   Time 10                PT Education - 04/15/17 1206    Education provided Yes   Education Details Reviewed HEP sitting   Person(s) Educated Patient   Methods Explanation   Comprehension Verbalized understanding;Returned demonstration          PT Short Term Goals - 04/13/17 1121      PT SHORT TERM GOAL #1   Title pt will be independent in her HEP.    Time 4   Period Weeks   Status Achieved           PT Long Term Goals - 04/15/17 1224      PT LONG TERM GOAL #1   Title Pt will improve her FOTO score from 37% limitation to 28% limitation in order to improve function.    Time 8   Period Weeks   Status  On-going     PT LONG TERM GOAL #2   Title Pt will improve bilateral LE strength to >/= 4+/5 in order to improve functional mobility.    Time 8   Period Weeks   Status On-going     PT LONG TERM GOAL #3   Title Pt will be able to amb 30 minutes for communtiy amb.    Time 8   Period Weeks   Status Achieved     PT LONG TERM GOAL #4   Title Pt will report pain </=3/10 with sit to stand transfers throughtout each day.    Time 8   Period Weeks   Status Achieved               Plan - 04/15/17 1208    Clinical Impression Statement Patient arriving to clinic today reporting no pain and improvemetns since begining therapy and since injection. Pt reporting ability to sit without pain. Pt tolerating LE strengthening and traction well. Continue with skilled PT as pt tolerates   Rehab Potential Good   PT Frequency 2x / week   PT Duration 8 weeks   PT Treatment/Interventions ADLs/Self Care Home Management;Dry needling;Taping;Cryotherapy;Electrical Stimulation;Iontophoresis 86m/ml Dexamethasone;Moist Heat;Traction;Therapeutic exercise;Therapeutic activities;Functional mobility  training;Stair training;Gait training;Patient/family education;Manual techniques;Passive range of motion   PT Next Visit Plan Traction, E-stim, manual as appropriate, gentle lumbar exercises   PT Home Exercise Plan Walking program   Consulted and Agree with Plan of Care Patient      Patient will benefit from skilled therapeutic intervention in order to improve the following deficits and impairments:  Abnormal gait, Pain, Decreased activity tolerance, Decreased strength, Decreased balance, Postural dysfunction  Visit Diagnosis: Acute left-sided low back pain with left-sided sciatica  Muscle weakness (generalized)     Problem List Patient Active Problem List   Diagnosis Date Noted  . Sciatica of left side 03/24/2017  . Chronic allergic rhinitis 03/24/2017  . Hypothyroidism 07/03/2015  . Toxic goiter   . Acute maxillary sinusitis 10/18/2014  . Hypertension 04/19/2014  . Hyperlipidemia 04/19/2014  . Generalized anxiety disorder 04/19/2014  . Insomnia 04/19/2014    JOretha Caprice MPT 04/15/2017, 12:26 PM  [facility] [place] [address]Phone: [phone]  Fax:  [phone] Name: Rita STYLESMRN: [id number] of Birth: [birth year] PHYSICAL THERAPY DISCHARGE SUMMARY  Visits from Start of Care: 5.  Current functional level related to goals / functional outcomes: See above.   Remaining deficits: LTG's #1 and 2 unmet.   Education / Equipment: HEP. Plan: Patient agrees to discharge.  Patient goals were partially met. Patient is being discharged due to being pleased with the current functional level.  ?????         CMaliApplegate MPT

## 2017-04-26 ENCOUNTER — Encounter: Payer: Self-pay | Admitting: Physical Therapy

## 2017-05-27 ENCOUNTER — Other Ambulatory Visit: Payer: Self-pay | Admitting: Family Medicine

## 2017-05-27 DIAGNOSIS — F419 Anxiety disorder, unspecified: Secondary | ICD-10-CM

## 2017-05-27 NOTE — Telephone Encounter (Signed)
Last filled 04/28/17, last seen 03/24/17. Route to pool for call in

## 2017-05-28 NOTE — Telephone Encounter (Signed)
Phoned in.

## 2017-06-25 ENCOUNTER — Ambulatory Visit: Payer: Medicare HMO | Admitting: Physician Assistant

## 2017-06-28 ENCOUNTER — Ambulatory Visit (INDEPENDENT_AMBULATORY_CARE_PROVIDER_SITE_OTHER): Payer: Medicare HMO | Admitting: Physician Assistant

## 2017-06-28 ENCOUNTER — Encounter: Payer: Self-pay | Admitting: Physician Assistant

## 2017-06-28 VITALS — BP 136/77 | HR 76 | Temp 97.0°F | Ht 63.0 in | Wt 156.0 lb

## 2017-06-28 DIAGNOSIS — I10 Essential (primary) hypertension: Secondary | ICD-10-CM | POA: Diagnosis not present

## 2017-06-28 DIAGNOSIS — E784 Other hyperlipidemia: Secondary | ICD-10-CM | POA: Diagnosis not present

## 2017-06-28 DIAGNOSIS — E7849 Other hyperlipidemia: Secondary | ICD-10-CM

## 2017-06-28 DIAGNOSIS — E039 Hypothyroidism, unspecified: Secondary | ICD-10-CM | POA: Diagnosis not present

## 2017-06-28 MED ORDER — LEVOTHYROXINE SODIUM 75 MCG PO TABS: 75.0000 ug | ORAL_TABLET | Freq: Every day | ORAL | 3 refills | Status: DC

## 2017-06-28 NOTE — Patient Instructions (Signed)
In a few days you may receive a survey in the mail or online from [employer] regarding your visit with us today. Please take a moment to fill this out. Your feedback is very important to our whole office. It can help us better understand your needs as well as improve your experience and satisfaction. Thank you for taking your time to complete it. We care about you.  Arasely Akkerman, PA-C  

## 2017-06-29 LAB — CMP14+EGFR
A/G RATIO: 1.8 (ref 1.2–2.2)
ALBUMIN: 4.2 g/dL (ref 3.5–4.7)
ALK PHOS: 93 IU/L (ref 39–117)
ALT: 10 IU/L (ref 0–32)
AST: 15 IU/L (ref 0–40)
BILIRUBIN TOTAL: 0.6 mg/dL (ref 0.0–1.2)
BUN / CREAT RATIO: 13 (ref 12–28)
BUN: 16 mg/dL (ref 8–27)
CHLORIDE: 100 mmol/L (ref 96–106)
CO2: 22 mmol/L (ref 20–29)
Calcium: 9.4 mg/dL (ref 8.7–10.3)
Creatinine, Ser: 1.25 mg/dL — ABNORMAL HIGH (ref 0.57–1.00)
GFR calc Af Amer: 47 mL/min/{1.73_m2} — ABNORMAL LOW (ref >59)
GFR calc non Af Amer: 40 mL/min/{1.73_m2} — ABNORMAL LOW (ref >59)
GLOBULIN, TOTAL: 2.4 g/dL (ref 1.5–4.5)
GLUCOSE: 91 mg/dL (ref 65–99)
POTASSIUM: 4.7 mmol/L (ref 3.5–5.2)
SODIUM: 139 mmol/L (ref 134–144)
Total Protein: 6.6 g/dL (ref 6.0–8.5)

## 2017-06-29 NOTE — Progress Notes (Signed)
BP 136/77   Pulse 76   Temp 97 F (36.1 C) (Oral)   Ht 5' 3"  (1.6 m)   Wt 156 lb (70.8 kg)   BMI 27.63 kg/m    Subjective:    Patient ID: Rita Wells, female    DOB: 27-May-1933, 83 y.o.   MRN: [id number]  HPI: Rita Wells is a 83 y.o. female presenting on 06/28/2017 for Follow-up (3 month follow up )  This patient comes in for periodic recheck on medications and conditions including Hypertension, hypothyroidism and hyperlipidemia she is doing very well overall and does need some refills on medications. Her blood work will be updated today..   All medications are reviewed today. There are no reports of any problems with the medications. All of the medical conditions are reviewed and updated.  Lab work is reviewed and will be ordered as medically necessary. There are no new problems reported with today's visit.   Relevant past medical, surgical, family and social history reviewed and updated as indicated. Allergies and medications reviewed and updated.  Past Medical History:  Diagnosis Date  . Hyperlipidemia   . Hypertension   . Toxic goiter     Past Surgical History:  Procedure Laterality Date  . CERVICAL FUSION    . TUBAL LIGATION      Review of Systems  Constitutional: Negative.  Negative for activity change, fatigue and fever.  HENT: Negative.   Eyes: Negative.   Respiratory: Negative.  Negative for cough.   Cardiovascular: Negative.  Negative for chest pain.  Gastrointestinal: Negative.  Negative for abdominal pain.  Endocrine: Negative.   Genitourinary: Negative.  Negative for dysuria.  Musculoskeletal: Negative.   Skin: Negative.   Neurological: Negative.     Allergies as of 06/28/2017      Reactions   Azor [amlodipine-olmesartan]       Medication List       Accurate as of 06/28/17 11:59 PM. Always use your most recent med list.          ALPRAZolam 0.25 MG tablet Commonly known as:  XANAX TAKE ONE TABLET BY MOUTH TWICE DAILY AS NEEDED FOR  ANXIETY   bisoprolol-hydrochlorothiazide 10-6.25 MG tablet Commonly known as:  ZIAC Take 1 tablet by mouth daily.   cetirizine 10 MG tablet Commonly known as:  ZYRTEC Take 1 tablet (10 mg total) by mouth daily.   levothyroxine 75 MCG tablet Commonly known as:  SYNTHROID, LEVOTHROID Take 1 tablet (75 mcg total) by mouth daily.   metoprolol succinate 100 MG 24 hr tablet Commonly known as:  TOPROL-XL Take 1 tablet (100 mg total) by mouth daily. Take with or immediately following a meal.   pantoprazole 40 MG tablet Commonly known as:  PROTONIX Take 1 tablet (40 mg total) by mouth daily.   rosuvastatin 20 MG tablet Commonly known as:  CRESTOR Take 1 tablet (20 mg total) by mouth daily.          Objective:    BP 136/77   Pulse 76   Temp 97 F (36.1 C) (Oral)   Ht 5' 3"  (1.6 m)   Wt 156 lb (70.8 kg)   BMI 27.63 kg/m   Allergies  Allergen Reactions  . Azor [Amlodipine-Olmesartan]     Physical Exam  Constitutional: She is oriented to person, place, and time. She appears well-developed and well-nourished.  HENT:  Head: Normocephalic and atraumatic.  Right Ear: Tympanic membrane, external ear and ear canal normal.  Left Ear: Tympanic membrane, external  ear and ear canal normal.  Nose: Nose normal. No rhinorrhea.  Mouth/Throat: Oropharynx is clear and moist and mucous membranes are normal. No oropharyngeal exudate or posterior oropharyngeal erythema.  Eyes: Conjunctivae and EOM are normal. Pupils are equal, round, and reactive to light.  Neck: Normal range of motion. Neck supple.  Cardiovascular: Normal rate, regular rhythm, normal heart sounds and intact distal pulses.   Pulmonary/Chest: Effort normal and breath sounds normal.  Abdominal: Soft. Bowel sounds are normal.  Neurological: She is alert and oriented to person, place, and time. She has normal reflexes.  Skin: Skin is warm and dry. No rash noted.  Psychiatric: She has a normal mood and affect. Her behavior is  normal. Judgment and thought content normal.    Results for orders placed or performed in visit on 06/28/17  CMP14+EGFR  Result Value Ref Range   Glucose 91 65 - 99 mg/dL   BUN 16 8 - 27 mg/dL   Creatinine, Ser 1.25 (H) 0.57 - 1.00 mg/dL   GFR calc non Af Amer 40 (L) >59 mL/min/1.73   GFR calc Af Amer 47 (L) >59 mL/min/1.73   BUN/Creatinine Ratio 13 12 - 28   Sodium 139 134 - 144 mmol/L   Potassium 4.7 3.5 - 5.2 mmol/L   Chloride 100 96 - 106 mmol/L   CO2 22 20 - 29 mmol/L   Calcium 9.4 8.7 - 10.3 mg/dL   Total Protein 6.6 6.0 - 8.5 g/dL   Albumin 4.2 3.5 - 4.7 g/dL   Globulin, Total 2.4 1.5 - 4.5 g/dL   Albumin/Globulin Ratio 1.8 1.2 - 2.2   Bilirubin Total 0.6 0.0 - 1.2 mg/dL   Alkaline Phosphatase 93 39 - 117 IU/L   AST 15 0 - 40 IU/L   ALT 10 0 - 32 IU/L      Assessment & Plan:   1. Essential hypertension - CMP14+EGFR  2. Hypothyroidism, unspecified type - levothyroxine (SYNTHROID, LEVOTHROID) 75 MCG tablet; Take 1 tablet (75 mcg total) by mouth daily.  Dispense: 90 tablet; Refill: 3  3. Other hyperlipidemia    Current Outpatient Prescriptions:  .  ALPRAZolam (XANAX) 0.25 MG tablet, TAKE ONE TABLET BY MOUTH TWICE DAILY AS NEEDED FOR ANXIETY, Disp: 60 tablet, Rfl: 1 .  bisoprolol-hydrochlorothiazide (ZIAC) 10-6.25 MG tablet, Take 1 tablet by mouth daily., Disp: 90 tablet, Rfl: 0 .  cetirizine (ZYRTEC) 10 MG tablet, Take 1 tablet (10 mg total) by mouth daily., Disp: 30 tablet, Rfl: 11 .  levothyroxine (SYNTHROID, LEVOTHROID) 75 MCG tablet, Take 1 tablet (75 mcg total) by mouth daily., Disp: 90 tablet, Rfl: 3 .  metoprolol succinate (TOPROL-XL) 100 MG 24 hr tablet, Take 1 tablet (100 mg total) by mouth daily. Take with or immediately following a meal., Disp: 90 tablet, Rfl: 3 .  pantoprazole (PROTONIX) 40 MG tablet, Take 1 tablet (40 mg total) by mouth daily., Disp: 30 tablet, Rfl: 3 .  rosuvastatin (CRESTOR) 20 MG tablet, Take 1 tablet (20 mg total) by mouth daily.,  Disp: 90 tablet, Rfl: 3  Continue all other maintenance medications as listed above.  Follow up plan: Return in about 4 months (around 10/29/2017) for recheck.  Educational handout given for [place] PA-C [place] [address]  [place], [place] [address] [phone]   06/29/2017, 10:52 AM

## 2017-07-02 ENCOUNTER — Ambulatory Visit: Payer: Medicare HMO

## 2017-07-15 ENCOUNTER — Encounter: Payer: Self-pay | Admitting: *Deleted

## 2017-09-30 ENCOUNTER — Encounter: Payer: Self-pay | Admitting: Family Medicine

## 2017-09-30 ENCOUNTER — Ambulatory Visit (INDEPENDENT_AMBULATORY_CARE_PROVIDER_SITE_OTHER): Payer: Medicare HMO | Admitting: Family Medicine

## 2017-09-30 VITALS — BP 138/74 | HR 66 | Temp 97.4°F | Ht 63.0 in | Wt 159.0 lb

## 2017-09-30 DIAGNOSIS — M67911 Unspecified disorder of synovium and tendon, right shoulder: Secondary | ICD-10-CM

## 2017-09-30 MED ORDER — MELOXICAM 15 MG PO TABS: 15.0000 mg | ORAL_TABLET | Freq: Every day | ORAL | 0 refills | Status: DC

## 2017-09-30 NOTE — Progress Notes (Signed)
BP (!) 147/76   Pulse 66   Temp (!) 97.4 F (36.3 C) (Oral)   Ht 5\' 3"  (1.6 m)   Wt 159 lb (72.1 kg)   BMI 28.17 kg/m    Subjective:    Patient ID: Rita Wells, female    DOB: 10-05-1933, 83 y.o.   MRN: [id number]  HPI: Rita Wells is a 83 y.o. female presenting on 09/30/2017 for Right shoulder pain (x 2-3 weeks) and Hoarse   HPI Right shoulder pain Patient is coming in with complaints of right shoulder pain is been going on for 2-3 weeks. She cannot recall any specific incident where she hurt it but she has been lifting some water buckets more and pulling herself into her car more than she usually does and she has been having pain in that shoulder. She has pain with overhead range of motion and movement across her body with that right shoulder. She denies any fevers or chills or numbness or weakness or pain traveling anywhere else.  Relevant past medical, surgical, family and social history reviewed and updated as indicated. Interim medical history since our last visit reviewed. Allergies and medications reviewed and updated.  Review of Systems  Constitutional: Negative for chills and fever.  Respiratory: Negative for chest tightness and shortness of breath.   Cardiovascular: Negative for chest pain and leg swelling.  Musculoskeletal: Positive for arthralgias. Negative for back pain and gait problem.  Skin: Negative for color change and rash.  Neurological: Negative for light-headedness and headaches.  Psychiatric/Behavioral: Negative for agitation and behavioral problems.  All other systems reviewed and are negative.   Per HPI unless specifically indicated above        Objective:    BP (!) 147/76   Pulse 66   Temp (!) 97.4 F (36.3 C) (Oral)   Ht 5\' 3"  (1.6 m)   Wt 159 lb (72.1 kg)   BMI 28.17 kg/m   Wt Readings from Last 3 Encounters:  09/30/17 159 lb (72.1 kg)  06/28/17 156 lb (70.8 kg)  03/24/17 152 lb 6.4 oz (69.1 kg)    Physical Exam    Constitutional: She is oriented to person, place, and time. She appears well-developed and well-nourished. No distress.  Eyes: Conjunctivae are normal.  Cardiovascular: Normal rate, regular rhythm, normal heart sounds and intact distal pulses.   No murmur heard. Pulmonary/Chest: Effort normal and breath sounds normal. No respiratory distress. She has no wheezes. She has no rales.  Musculoskeletal: Normal range of motion. She exhibits tenderness. She exhibits no edema.       Right shoulder: She exhibits tenderness (Lateral tenderness, positive empty can and positive pain with internal rotation). She exhibits normal range of motion, no bony tenderness, no swelling and no deformity.  Neurological: She is alert and oriented to person, place, and time. Coordination normal.  Skin: Skin is warm and dry. No rash noted. She is not diaphoretic.  Psychiatric: She has a normal mood and affect. Her behavior is normal.  Nursing note and vitals reviewed.       Assessment & Plan:   Problem List Items Addressed This Visit    None    Visit Diagnoses    Rotator cuff dysfunction, right    -  Primary   Relevant Medications   meloxicam (MOBIC) 15 MG tablet       Follow up plan: Return if symptoms worsen or fail to improve.  Counseling provided for all of the vaccine components No orders of  the defined types were placed in this encounter.   Caryl Pina, MD [facility] 09/30/2017, 3:32 PM

## 2017-10-01 ENCOUNTER — Other Ambulatory Visit: Payer: Self-pay | Admitting: Physician Assistant

## 2017-10-01 DIAGNOSIS — F419 Anxiety disorder, unspecified: Secondary | ICD-10-CM

## 2017-10-01 NOTE — Telephone Encounter (Signed)
Go ahead and call in 1 month refill and then she needs to follow up with [facility] after that

## 2017-10-01 NOTE — Telephone Encounter (Signed)
Seen yesterday Dr Consuela Mimes PCP  If approved route to nurse to call into [place]   [phone]

## 2017-10-01 NOTE — Telephone Encounter (Signed)
Phoned in.

## 2017-10-27 ENCOUNTER — Ambulatory Visit (INDEPENDENT_AMBULATORY_CARE_PROVIDER_SITE_OTHER): Payer: Medicare HMO | Admitting: Physician Assistant

## 2017-10-27 ENCOUNTER — Encounter: Payer: Self-pay | Admitting: Physician Assistant

## 2017-10-27 VITALS — BP 156/100 | HR 75 | Temp 97.0°F | Ht 63.0 in | Wt 158.6 lb

## 2017-10-27 DIAGNOSIS — F411 Generalized anxiety disorder: Secondary | ICD-10-CM

## 2017-10-27 DIAGNOSIS — Z23 Encounter for immunization: Secondary | ICD-10-CM | POA: Diagnosis not present

## 2017-10-27 DIAGNOSIS — E039 Hypothyroidism, unspecified: Secondary | ICD-10-CM

## 2017-10-27 DIAGNOSIS — M5030 Other cervical disc degeneration, unspecified cervical region: Secondary | ICD-10-CM | POA: Diagnosis not present

## 2017-10-27 DIAGNOSIS — I10 Essential (primary) hypertension: Secondary | ICD-10-CM | POA: Diagnosis not present

## 2017-10-27 DIAGNOSIS — M5134 Other intervertebral disc degeneration, thoracic region: Secondary | ICD-10-CM | POA: Diagnosis not present

## 2017-10-27 DIAGNOSIS — E7849 Other hyperlipidemia: Secondary | ICD-10-CM

## 2017-10-27 MED ORDER — GABAPENTIN 100 MG PO CAPS: 100.0000 mg | ORAL_CAPSULE | Freq: Three times a day (TID) | ORAL | 2 refills | Status: DC

## 2017-10-27 NOTE — Progress Notes (Signed)
BP (!) 156/100   Pulse 75   Temp (!) 97 F (36.1 C) (Oral)   Ht 5' 3"  (1.6 m)   Wt 158 lb 9.6 oz (71.9 kg)   BMI 28.09 kg/m    Subjective:    Patient ID: Rita Wells, female    DOB: [birth year], 83 y.o.   MRN: [id number]  HPI: Rita Wells is a 83 y.o. female presenting on 10/27/2017 for Follow-up (4 month ); Hypertension; Hyperlipidemia; and Hypothyroidism  For recheck on her hypertension, hyperlipidemia, hypothyroidism.  She still has chronic back pain related to her degenerative disc disease of the thoracic and cervical spine.  Her medications are reviewed and will be refilled as needed she is due labs today.  She reports that the gabapentin helps a lot with her pain.  With her blood pressure being slightly elevated and her starting Mobic just earlier this month I do think the 2 are related.  I have asked her to stop this medication.  I am going to recheck her in 1 month to see if the blood pressure comes back down.  Relevant past medical, surgical, family and social history reviewed and updated as indicated. Allergies and medications reviewed and updated.  Past Medical History:  Diagnosis Date  . Hyperlipidemia   . Hypertension   . Toxic goiter     Past Surgical History:  Procedure Laterality Date  . CERVICAL FUSION    . TUBAL LIGATION      Review of Systems  Constitutional: Negative.  Negative for activity change, fatigue and fever.  HENT: Negative.   Eyes: Negative.   Respiratory: Negative.  Negative for cough.   Cardiovascular: Negative.  Negative for chest pain.  Gastrointestinal: Negative.  Negative for abdominal pain.  Endocrine: Negative.   Genitourinary: Negative.  Negative for dysuria.  Musculoskeletal: Positive for arthralgias, back pain and myalgias.  Skin: Negative.   Neurological: Negative.     Allergies as of 10/27/2017      Reactions   Azor [amlodipine-olmesartan]       Medication List       Accurate as of 10/27/17 12:13 PM. Always  use your most recent med list.          ALPRAZolam 0.25 MG tablet Commonly known as:  XANAX TAKE ONE TABLET BY MOUTH TWICE DAILY AS NEEDED   bisoprolol-hydrochlorothiazide 10-6.25 MG tablet Commonly known as:  ZIAC Take 1 tablet by mouth daily.   fluticasone 50 MCG/ACT nasal spray Commonly known as:  FLONASE Place 2 sprays into both nostrils daily.   gabapentin 100 MG capsule Commonly known as:  NEURONTIN Take 1-3 capsules (100-300 mg total) by mouth 3 (three) times daily.   levothyroxine 75 MCG tablet Commonly known as:  SYNTHROID, LEVOTHROID Take 1 tablet (75 mcg total) by mouth daily.   metoprolol succinate 100 MG 24 hr tablet Commonly known as:  TOPROL-XL Take 1 tablet (100 mg total) by mouth daily. Take with or immediately following a meal.   pantoprazole 40 MG tablet Commonly known as:  PROTONIX Take 1 tablet (40 mg total) by mouth daily.   rosuvastatin 20 MG tablet Commonly known as:  CRESTOR Take 1 tablet (20 mg total) by mouth daily.          Objective:    BP (!) 156/100   Pulse 75   Temp (!) 97 F (36.1 C) (Oral)   Ht 5' 3"  (1.6 m)   Wt 158 lb 9.6 oz (71.9 kg)   BMI 28.09  kg/m   Allergies  Allergen Reactions  . Azor [Amlodipine-Olmesartan]     Physical Exam  Constitutional: She is oriented to person, place, and time. She appears well-developed and well-nourished.  HENT:  Head: Normocephalic and atraumatic.  Eyes: Pupils are equal, round, and reactive to light. Conjunctivae and EOM are normal.  Cardiovascular: Normal rate, regular rhythm, normal heart sounds and intact distal pulses.   Pulmonary/Chest: Effort normal and breath sounds normal.  Abdominal: Soft. Bowel sounds are normal.  Neurological: She is alert and oriented to person, place, and time. She has normal reflexes.  Skin: Skin is warm and dry. No rash noted.  Psychiatric: She has a normal mood and affect. Her behavior is normal. Judgment and thought content normal.  Nursing note  and vitals reviewed.   Results for orders placed or performed in visit on 06/28/17  CMP14+EGFR  Result Value Ref Range   Glucose 91 65 - 99 mg/dL   BUN 16 8 - 27 mg/dL   Creatinine, Ser 1.25 (H) 0.57 - 1.00 mg/dL   GFR calc non Af Amer 40 (L) >59 mL/min/1.73   GFR calc Af Amer 47 (L) >59 mL/min/1.73   BUN/Creatinine Ratio 13 12 - 28   Sodium 139 134 - 144 mmol/L   Potassium 4.7 3.5 - 5.2 mmol/L   Chloride 100 96 - 106 mmol/L   CO2 22 20 - 29 mmol/L   Calcium 9.4 8.7 - 10.3 mg/dL   Total Protein 6.6 6.0 - 8.5 g/dL   Albumin 4.2 3.5 - 4.7 g/dL   Globulin, Total 2.4 1.5 - 4.5 g/dL   Albumin/Globulin Ratio 1.8 1.2 - 2.2   Bilirubin Total 0.6 0.0 - 1.2 mg/dL   Alkaline Phosphatase 93 39 - 117 IU/L   AST 15 0 - 40 IU/L   ALT 10 0 - 32 IU/L      Assessment & Plan:   1. Hypothyroidism, unspecified type - TSH  2. Other hyperlipidemia - CMP14+EGFR - Microalbumin / creatinine urine ratio - CBC with Differential/Platelet  3. Generalized anxiety disorder  4. Essential hypertension - CMP14+EGFR - Microalbumin / creatinine urine ratio - CBC with Differential/Platelet  5. DDD (degenerative disc disease), thoracic - gabapentin (NEURONTIN) 100 MG capsule; Take 1-3 capsules (100-300 mg total) by mouth 3 (three) times daily.  Dispense: 90 capsule; Refill: 2  6. DDD (degenerative disc disease), cervical  7. Encounter for immunization - CMP14+EGFR - Microalbumin / creatinine urine ratio - CBC with Differential/Platelet - gabapentin (NEURONTIN) 100 MG capsule; Take 1-3 capsules (100-300 mg total) by mouth 3 (three) times daily.  Dispense: 90 capsule; Refill: 2 - TSH - Flu vaccine HIGH DOSE PF    Current Outpatient Prescriptions:  .  ALPRAZolam (XANAX) 0.25 MG tablet, TAKE ONE TABLET BY MOUTH TWICE DAILY AS NEEDED, Disp: 60 tablet, Rfl: 0 .  bisoprolol-hydrochlorothiazide (ZIAC) 10-6.25 MG tablet, Take 1 tablet by mouth daily., Disp: 90 tablet, Rfl: 0 .  fluticasone (FLONASE) 50  MCG/ACT nasal spray, Place 2 sprays into both nostrils daily., Disp: , Rfl:  .  levothyroxine (SYNTHROID, LEVOTHROID) 75 MCG tablet, Take 1 tablet (75 mcg total) by mouth daily., Disp: 90 tablet, Rfl: 3 .  metoprolol succinate (TOPROL-XL) 100 MG 24 hr tablet, Take 1 tablet (100 mg total) by mouth daily. Take with or immediately following a meal., Disp: 90 tablet, Rfl: 3 .  pantoprazole (PROTONIX) 40 MG tablet, Take 1 tablet (40 mg total) by mouth daily., Disp: 30 tablet, Rfl: 3 .  rosuvastatin (CRESTOR)  20 MG tablet, Take 1 tablet (20 mg total) by mouth daily., Disp: 90 tablet, Rfl: 3 .  gabapentin (NEURONTIN) 100 MG capsule, Take 1-3 capsules (100-300 mg total) by mouth 3 (three) times daily., Disp: 90 capsule, Rfl: 2 Continue all other maintenance medications as listed above.  Follow up plan: Return in about 4 weeks (around 11/24/2017) for recheck.  Educational handout given for [place] PA-C [facility] [address]  [place], [place] [address] [phone]   10/27/2017, 12:13 PM

## 2017-10-27 NOTE — Patient Instructions (Signed)
In a few days you may receive a survey in the mail or online from [employer] regarding your visit with us today. Please take a moment to fill this out. Your feedback is very important to our whole office. It can help us better understand your needs as well as improve your experience and satisfaction. Thank you for taking your time to complete it. We care about you.  Zylan Almquist, PA-C  

## 2017-10-28 LAB — CBC WITH DIFFERENTIAL/PLATELET
Basophils Absolute: 0 10*3/uL (ref 0.0–0.2)
Basos: 0 %
EOS (ABSOLUTE): 0.6 10*3/uL — AB (ref 0.0–0.4)
Eos: 6 %
Hematocrit: 43.8 % (ref 34.0–46.6)
Hemoglobin: 14.3 g/dL (ref 11.1–15.9)
IMMATURE GRANULOCYTES: 0 %
Immature Grans (Abs): 0 10*3/uL (ref 0.0–0.1)
Lymphocytes Absolute: 2.2 10*3/uL (ref 0.7–3.1)
Lymphs: 24 %
MCH: 27.8 pg (ref 26.6–33.0)
MCHC: 32.6 g/dL (ref 31.5–35.7)
MCV: 85 fL (ref 79–97)
MONOS ABS: 0.7 10*3/uL (ref 0.1–0.9)
Monocytes: 8 %
NEUTROS PCT: 62 %
Neutrophils Absolute: 5.5 10*3/uL (ref 1.4–7.0)
PLATELETS: 215 10*3/uL (ref 150–379)
RBC: 5.14 x10E6/uL (ref 3.77–5.28)
RDW: 15 % (ref 12.3–15.4)
WBC: 9 10*3/uL (ref 3.4–10.8)

## 2017-10-28 LAB — TSH: TSH: 4.62 u[IU]/mL — ABNORMAL HIGH (ref 0.450–4.500)

## 2017-10-28 LAB — CMP14+EGFR
A/G RATIO: 1.8 (ref 1.2–2.2)
ALK PHOS: 87 IU/L (ref 39–117)
ALT: 13 IU/L (ref 0–32)
AST: 21 IU/L (ref 0–40)
Albumin: 4.2 g/dL (ref 3.5–4.7)
BUN/Creatinine Ratio: 15 (ref 12–28)
BUN: 18 mg/dL (ref 8–27)
Bilirubin Total: 0.5 mg/dL (ref 0.0–1.2)
CHLORIDE: 101 mmol/L (ref 96–106)
CO2: 22 mmol/L (ref 20–29)
Calcium: 9.6 mg/dL (ref 8.7–10.3)
Creatinine, Ser: 1.21 mg/dL — ABNORMAL HIGH (ref 0.57–1.00)
GFR calc Af Amer: 48 mL/min/{1.73_m2} — ABNORMAL LOW (ref >59)
GFR calc non Af Amer: 42 mL/min/{1.73_m2} — ABNORMAL LOW (ref >59)
GLOBULIN, TOTAL: 2.4 g/dL (ref 1.5–4.5)
Glucose: 92 mg/dL (ref 65–99)
POTASSIUM: 4.8 mmol/L (ref 3.5–5.2)
SODIUM: 138 mmol/L (ref 134–144)
Total Protein: 6.6 g/dL (ref 6.0–8.5)

## 2017-10-28 LAB — MICROALBUMIN / CREATININE URINE RATIO
Creatinine, Urine: 64.8 mg/dL
MICROALB/CREAT RATIO: 44.9 mg/g{creat} — AB (ref 0.0–30.0)
Microalbumin, Urine: 29.1 ug/mL

## 2017-10-29 ENCOUNTER — Ambulatory Visit: Payer: Medicare HMO | Admitting: Physician Assistant

## 2017-11-16 ENCOUNTER — Other Ambulatory Visit: Payer: Self-pay | Admitting: Physician Assistant

## 2017-11-16 ENCOUNTER — Other Ambulatory Visit: Payer: Self-pay | Admitting: Family Medicine

## 2017-11-16 DIAGNOSIS — F419 Anxiety disorder, unspecified: Secondary | ICD-10-CM

## 2017-11-16 MED ORDER — DOXYCYCLINE HYCLATE 100 MG PO TABS: 100.0000 mg | ORAL_TABLET | Freq: Two times a day (BID) | ORAL | 0 refills | Status: DC

## 2017-11-16 NOTE — Telephone Encounter (Signed)
Patient is having chest congestion and productive cough.  She has had this for several days and is not getting any better with OTC medications.  She is unable to come in today, her son passed away 2 days ago.  [place] - please advise.

## 2017-11-16 NOTE — Telephone Encounter (Signed)
last seen 10/26/17  Rita Wells If approved route to nurse to call into [facility]  [phone]

## 2017-11-16 NOTE — Telephone Encounter (Signed)
Daughter aware that rx sent to pharmacy.

## 2017-11-17 ENCOUNTER — Other Ambulatory Visit: Payer: Self-pay | Admitting: Family Medicine

## 2017-11-17 DIAGNOSIS — F419 Anxiety disorder, unspecified: Secondary | ICD-10-CM

## 2017-11-22 ENCOUNTER — Telehealth: Payer: Self-pay | Admitting: Physician Assistant

## 2017-11-22 NOTE — Telephone Encounter (Signed)
Pt's phone has a fast busy Did speak to [facility], they did receive the authorized refill last Wednesday, they tried to contact her last week, they will contact her again Closing encounter

## 2017-11-22 NOTE — Telephone Encounter (Signed)
What is the name of the medication? alprazol 2.5 mg  Have you contacted your pharmacy to request a refill? yes  Which pharmacy would you like this sent to? [facility]   Patient notified that their request is being sent to the clinical staff for review and that they should receive a call once it is complete. If they do not receive a call within 24 hours they can check with their pharmacy or our office.

## 2017-11-26 ENCOUNTER — Ambulatory Visit: Payer: Medicare HMO | Admitting: Physician Assistant

## 2018-03-02 ENCOUNTER — Other Ambulatory Visit: Payer: Self-pay | Admitting: Physician Assistant

## 2018-03-02 DIAGNOSIS — F419 Anxiety disorder, unspecified: Secondary | ICD-10-CM

## 2018-03-02 NOTE — Telephone Encounter (Signed)
Last seen 10/18  [facility]

## 2018-03-07 ENCOUNTER — Other Ambulatory Visit: Payer: Self-pay | Admitting: Physician Assistant

## 2018-03-07 DIAGNOSIS — I10 Essential (primary) hypertension: Secondary | ICD-10-CM

## 2018-03-24 ENCOUNTER — Ambulatory Visit: Payer: Medicare HMO | Admitting: Physician Assistant

## 2018-03-28 ENCOUNTER — Ambulatory Visit: Payer: Medicare HMO | Admitting: Physician Assistant

## 2018-03-28 ENCOUNTER — Encounter: Payer: Self-pay | Admitting: Physician Assistant

## 2018-03-28 VITALS — BP 164/94 | HR 65 | Temp 97.6°F | Ht 63.0 in | Wt 159.0 lb

## 2018-03-28 DIAGNOSIS — I10 Essential (primary) hypertension: Secondary | ICD-10-CM | POA: Diagnosis not present

## 2018-03-28 DIAGNOSIS — J309 Allergic rhinitis, unspecified: Secondary | ICD-10-CM | POA: Diagnosis not present

## 2018-03-28 DIAGNOSIS — E039 Hypothyroidism, unspecified: Secondary | ICD-10-CM | POA: Diagnosis not present

## 2018-03-28 DIAGNOSIS — M5134 Other intervertebral disc degeneration, thoracic region: Secondary | ICD-10-CM

## 2018-03-28 MED ORDER — MONTELUKAST SODIUM 10 MG PO TABS: 10.0000 mg | ORAL_TABLET | Freq: Every day | ORAL | 3 refills | Status: DC

## 2018-03-28 MED ORDER — FLUTICASONE PROPIONATE 50 MCG/ACT NA SUSP
2.0000 | Freq: Every day | NASAL | 3 refills | Status: DC
Start: 2018-03-28 — End: 2019-04-19

## 2018-03-28 MED ORDER — LORATADINE 10 MG PO TABS: 10.0000 mg | ORAL_TABLET | Freq: Every day | ORAL | 3 refills | Status: DC

## 2018-03-28 NOTE — Patient Instructions (Signed)
In a few days you may receive a survey in the mail or online from [employer] regarding your visit with us today. Please take a moment to fill this out. Your feedback is very important to our whole office. It can help us better understand your needs as well as improve your experience and satisfaction. Thank you for taking your time to complete it. We care about you.  Jesson Foskey, PA-C  

## 2018-03-28 NOTE — Progress Notes (Signed)
BP (!) 164/94   Pulse 65   Temp 97.6 F (36.4 C) (Oral)   Ht _0  (1.6 m)   Wt 159 lb (72.1 kg)   BMI 28.17 kg/m    Subjective:    Patient ID: Rita Wells, female    DOB: 12-06-1933, 84 y.o.   MRN: [id number]  HPI: Rita Wells is a 84 y.o. female presenting on 03/28/2018 for Follow-up (6 month ); Allergies; and Hoarse  This patient comes in for periodic recheck on medications and conditions including,   Thanks ertension, hypothyroidism, degenerative joint disease.  She is doing well with her overall conditions.  She is having a severe time with allergies.  She has tried Zyrtec and Allegra in the past without much relief.  She is using some over-the-counter nasal sprays without much relief.  She has never used Flonase prescription, Singulair or Claritin.  These medications will be sent to her pharmacy today.  All medications are reviewed today. There are no reports of any problems with the medications. All of the medical conditions are reviewed and updated.  Lab work is reviewed and will be ordered as medically necessary. There are no new problems reported with today's visit.   Past Medical History:  Diagnosis Date  . Hyperlipidemia   . Hypertension   . Toxic goiter    Relevant past medical, surgical, family and social history reviewed and updated as indicated. Interim medical history since our last visit reviewed. Allergies and medications reviewed and updated. DATA REVIEWED: CHART IN EPIC  Family History reviewed for pertinent findings.  Review of Systems  Constitutional: Negative.  Negative for activity change, fatigue and fever.  HENT: Positive for congestion, postnasal drip, sneezing and sore throat.   Eyes: Positive for discharge and itching.  Respiratory: Negative.  Negative for cough.   Cardiovascular: Negative.  Negative for chest pain.  Gastrointestinal: Negative.  Negative for abdominal pain.  Endocrine: Negative.   Genitourinary: Negative.  Negative for  dysuria.  Musculoskeletal: Positive for arthralgias, back pain and joint swelling.  Skin: Negative.   Neurological: Negative.     Allergies as of 03/28/2018      Reactions   Azor [amlodipine-olmesartan]       Medication List        Accurate as of 03/28/18 10:02 AM. Always use your most recent med list.          ALPRAZolam 0.25 MG tablet Commonly known as:  XANAX TAKE ONE TABLET BY MOUTH TWICE DAILY   bisoprolol-hydrochlorothiazide 10-6.25 MG tablet Commonly known as:  ZIAC Take 1 tablet by mouth daily.   fluticasone 50 MCG/ACT nasal spray Commonly known as:  FLONASE Place 2 sprays into both nostrils daily.   gabapentin 100 MG capsule Commonly known as:  NEURONTIN Take 1-3 capsules (100-300 mg total) by mouth 3 (three) times daily.   levothyroxine 75 MCG tablet Commonly known as:  SYNTHROID, LEVOTHROID Take 1 tablet (75 mcg total) by mouth daily.   loratadine 10 MG tablet Commonly known as:  CLARITIN Take 1 tablet (10 mg total) by mouth daily.   metoprolol succinate 100 MG 24 hr tablet Commonly known as:  TOPROL-XL TAKE ONE TABLET BY MOUTH EVERY DAY. TAKEWITH OR IMMEDIATELY FOLLOWINGA MEAL   montelukast 10 MG tablet Commonly known as:  SINGULAIR Take 1 tablet (10 mg total) by mouth at bedtime.   pantoprazole 40 MG tablet Commonly known as:  PROTONIX Take 1 tablet (40 mg total) by mouth daily.   rosuvastatin 20  MG tablet Commonly known as:  CRESTOR Take 1 tablet (20 mg total) by mouth daily.          Objective:    BP (!) 164/94   Pulse 65   Temp 97.6 F (36.4 C) (Oral)   Ht _0  (1.6 m)   Wt 159 lb (72.1 kg)   BMI 28.17 kg/m   Allergies  Allergen Reactions  . Azor [Amlodipine-Olmesartan]     Wt Readings from Last 3 Encounters:  03/28/18 159 lb (72.1 kg)  10/27/17 158 lb 9.6 oz (71.9 kg)  09/30/17 159 lb (72.1 kg)    Physical Exam  Constitutional: She is oriented to person, place, and time. She appears well-developed and well-nourished.    HENT:  Head: Normocephalic and atraumatic.  Right Ear: A middle ear effusion is present.  Left Ear: A middle ear effusion is present.  Nose: Rhinorrhea present.  Mouth/Throat: Posterior oropharyngeal erythema present.  Eyes: Pupils are equal, round, and reactive to light. Conjunctivae and EOM are normal.  Cardiovascular: Normal rate, regular rhythm, normal heart sounds and intact distal pulses.  Pulmonary/Chest: Effort normal and breath sounds normal.  Abdominal: Soft. Bowel sounds are normal.  Neurological: She is alert and oriented to person, place, and time. She has normal reflexes.  Skin: Skin is warm and dry. No rash noted.  Psychiatric: She has a normal mood and affect. Her behavior is normal. Judgment and thought content normal.    Results for orders placed or performed in visit on 10/27/17  CMP14+EGFR  Result Value Ref Range   Glucose 92 65 - 99 mg/dL   BUN 18 8 - 27 mg/dL   Creatinine, Ser 1.21 (H) 0.57 - 1.00 mg/dL   GFR calc non Af Amer 42 (L) >59 mL/min/1.73   GFR calc Af Amer 48 (L) >59 mL/min/1.73   BUN/Creatinine Ratio 15 12 - 28   Sodium 138 134 - 144 mmol/L   Potassium 4.8 3.5 - 5.2 mmol/L   Chloride 101 96 - 106 mmol/L   CO2 22 20 - 29 mmol/L   Calcium 9.6 8.7 - 10.3 mg/dL   Total Protein 6.6 6.0 - 8.5 g/dL   Albumin 4.2 3.5 - 4.7 g/dL   Globulin, Total 2.4 1.5 - 4.5 g/dL   Albumin/Globulin Ratio 1.8 1.2 - 2.2   Bilirubin Total 0.5 0.0 - 1.2 mg/dL   Alkaline Phosphatase 87 39 - 117 IU/L   AST 21 0 - 40 IU/L   ALT 13 0 - 32 IU/L  Microalbumin / creatinine urine ratio  Result Value Ref Range   Creatinine, Urine 64.8 Not Estab. mg/dL   Microalbumin, Urine 29.1 Not Estab. ug/mL   Microalb/Creat Ratio 44.9 (H) 0.0 - 30.0 mg/g creat  CBC with Differential/Platelet  Result Value Ref Range   WBC 9.0 3.4 - 10.8 x10E3/uL   RBC 5.14 3.77 - 5.28 x10E6/uL   Hemoglobin 14.3 11.1 - 15.9 g/dL   Hematocrit 43.8 34.0 - 46.6 %   MCV 85 79 - 97 fL   MCH 27.8 26.6 -  33.0 pg   MCHC 32.6 31.5 - 35.7 g/dL   RDW 15.0 12.3 - 15.4 %   Platelets 215 150 - 379 x10E3/uL   Neutrophils 62 Not Estab. %   Lymphs 24 Not Estab. %   Monocytes 8 Not Estab. %   Eos 6 Not Estab. %   Basos 0 Not Estab. %   Neutrophils Absolute 5.5 1.4 - 7.0 x10E3/uL   Lymphocytes Absolute 2.2 0.7 -  3.1 x10E3/uL   Monocytes Absolute 0.7 0.1 - 0.9 x10E3/uL   EOS (ABSOLUTE) 0.6 (H) 0.0 - 0.4 x10E3/uL   Basophils Absolute 0.0 0.0 - 0.2 x10E3/uL   Immature Granulocytes 0 Not Estab. %   Immature Grans (Abs) 0.0 0.0 - 0.1 x10E3/uL  TSH  Result Value Ref Range   TSH 4.620 (H) 0.450 - 4.500 uIU/mL      Assessment & Plan:   1. Allergic rhinitis, unspecified seasonality, unspecified trigger - loratadine (CLARITIN) 10 MG tablet; Take 1 tablet (10 mg total) by mouth daily.  Dispense: 90 tablet; Refill: 3 - montelukast (SINGULAIR) 10 MG tablet; Take 1 tablet (10 mg total) by mouth at bedtime.  Dispense: 90 tablet; Refill: 3 - fluticasone (FLONASE) 50 MCG/ACT nasal spray; Place 2 sprays into both nostrils daily.  Dispense: 48 g; Refill: 3  2. Essential hypertension - CMP14+EGFR - CBC with Differential/Platelet - Microalbumin / creatinine urine ratio - TSH  3. Hypothyroidism, unspecified type - CMP14+EGFR - CBC with Differential/Platelet - Microalbumin / creatinine urine ratio - TSH  4. DDD (degenerative disc disease), thoracic   Continue all other maintenance medications as listed above.  Follow up plan: Return in about 6 months (around 09/27/2018) for recheck.  Educational handout given for [place] PA-C [place] [address]  [place], [place] [address] [phone]   03/28/2018, 10:02 AM

## 2018-03-29 LAB — CBC WITH DIFFERENTIAL/PLATELET
BASOS: 0 %
Basophils Absolute: 0 10*3/uL (ref 0.0–0.2)
EOS (ABSOLUTE): 0.6 10*3/uL — ABNORMAL HIGH (ref 0.0–0.4)
EOS: 7 %
HEMATOCRIT: 43.7 % (ref 34.0–46.6)
HEMOGLOBIN: 14.2 g/dL (ref 11.1–15.9)
Immature Grans (Abs): 0 10*3/uL (ref 0.0–0.1)
Immature Granulocytes: 0 %
LYMPHS ABS: 1.9 10*3/uL (ref 0.7–3.1)
Lymphs: 23 %
MCH: 27 pg (ref 26.6–33.0)
MCHC: 32.5 g/dL (ref 31.5–35.7)
MCV: 83 fL (ref 79–97)
MONOCYTES: 8 %
MONOS ABS: 0.7 10*3/uL (ref 0.1–0.9)
NEUTROS ABS: 5 10*3/uL (ref 1.4–7.0)
Neutrophils: 62 %
Platelets: 237 10*3/uL (ref 150–379)
RBC: 5.25 x10E6/uL (ref 3.77–5.28)
RDW: 15.4 % (ref 12.3–15.4)
WBC: 8.2 10*3/uL (ref 3.4–10.8)

## 2018-03-29 LAB — CMP14+EGFR
ALT: 15 IU/L (ref 0–32)
AST: 18 IU/L (ref 0–40)
Albumin/Globulin Ratio: 1.7 (ref 1.2–2.2)
Albumin: 4.2 g/dL (ref 3.5–4.7)
Alkaline Phosphatase: 105 IU/L (ref 39–117)
BUN/Creatinine Ratio: 9 — ABNORMAL LOW (ref 12–28)
BUN: 11 mg/dL (ref 8–27)
Bilirubin Total: 0.4 mg/dL (ref 0.0–1.2)
CO2: 24 mmol/L (ref 20–29)
CREATININE: 1.2 mg/dL — AB (ref 0.57–1.00)
Calcium: 9.2 mg/dL (ref 8.7–10.3)
Chloride: 101 mmol/L (ref 96–106)
GFR calc non Af Amer: 42 mL/min/{1.73_m2} — ABNORMAL LOW (ref >59)
GFR, EST AFRICAN AMERICAN: 49 mL/min/{1.73_m2} — AB (ref >59)
GLOBULIN, TOTAL: 2.5 g/dL (ref 1.5–4.5)
GLUCOSE: 98 mg/dL (ref 65–99)
Potassium: 4.6 mmol/L (ref 3.5–5.2)
SODIUM: 139 mmol/L (ref 134–144)
TOTAL PROTEIN: 6.7 g/dL (ref 6.0–8.5)

## 2018-03-29 LAB — TSH: TSH: 4.13 u[IU]/mL (ref 0.450–4.500)

## 2018-04-18 ENCOUNTER — Ambulatory Visit (INDEPENDENT_AMBULATORY_CARE_PROVIDER_SITE_OTHER): Payer: Medicare HMO | Admitting: Family Medicine

## 2018-04-18 VITALS — BP 142/92 | HR 70 | Temp 96.9°F | Ht 63.0 in | Wt 157.0 lb

## 2018-04-18 DIAGNOSIS — Z0000 Encounter for general adult medical examination without abnormal findings: Secondary | ICD-10-CM

## 2018-04-18 NOTE — Patient Instructions (Signed)
  Rita Wells , Thank you for taking time to come for your Medicare Wellness Visit. I appreciate your ongoing commitment to your health goals. Please review the following plan we discussed and let me know if I can assist you in the future.   These are the goals we discussed: Goals    . Exercise 150 min/wk Moderate Activity       This is a list of the screening recommended for you and due dates:  Health Maintenance  Topic Date Due  . DEXA scan (bone density measurement)  09/12/2000  . Pneumonia vaccines (1 of 2 - PCV13) 09/12/2000  . Tetanus Vaccine  08/01/2014  . Flu Shot  07/28/2018

## 2018-04-18 NOTE — Progress Notes (Addendum)
Subjective:   Rita Wells is a 84 y.o. female who presents for an Initial Medicare Annual Wellness Visit. She currently lives in [place] alone. She is a widow. Her husband passed away a while back with prostate cancer. She has 7 children, 3 daughters and 4 sons. 2 of her sons have passed away, one with lung cancer and the other with a cancerous tumor in his stomach. She is very active and is able to do for herself and clean and cook. She states that she doesn't have a specific exercise routine but she stays very active cleaning and her washer and dryer is in her basement so she climbs stairs. 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 Her main job was raising her children. She enjoys fishing off the pier at [employer] but hasn't been able to do that in quite some time due to her health. She is still grieving the loss of one of her sons as it has been recent. She hasn't been able to return to church since he passed away but she is hoping she will return soon. BP is elevated today but she states it is always elevated when she comes to the Dr. Edmonia Lynch of BP after visit was 142/92.   Review of Systems      Cardiac Risk Factors include: advanced age (>21men, >17 women);smoking/ tobacco exposure;hypertension     Objective:    Today's Vitals   04/18/18 1445  BP: (!) 167/89  Pulse: 70  Temp: (!) 96.9 F (36.1 C)  TempSrc: Oral  Weight: 157 lb (71.2 kg)  Height: 5\' 3"  (1.6 m)   Body mass index is 27.81 kg/m.  Advanced Directives 04/18/2018 04/15/2017 04/01/2017  Does Patient Have a Medical Advance Directive? Yes No No  Type of Advance Directive Living will - -  Does patient want to make changes to medical advance directive? No - Patient declined - -  Would patient like information on creating a medical advance directive? - - No - Patient declined   Patient states that she does have a will in place.  Current Medications (verified) Outpatient Encounter  Medications as of 04/18/2018  Medication Sig  . ALPRAZolam (XANAX) 0.25 MG tablet TAKE ONE TABLET BY MOUTH TWICE DAILY  . bisoprolol-hydrochlorothiazide (ZIAC) 10-6.25 MG tablet Take 1 tablet by mouth daily.  . fluticasone (FLONASE) 50 MCG/ACT nasal spray Place 2 sprays into both nostrils daily.  Marland Kitchen gabapentin (NEURONTIN) 100 MG capsule Take 1-3 capsules (100-300 mg total) by mouth 3 (three) times daily.  Marland Kitchen levothyroxine (SYNTHROID, LEVOTHROID) 75 MCG tablet Take 1 tablet (75 mcg total) by mouth daily.  Marland Kitchen loratadine (CLARITIN) 10 MG tablet Take 1 tablet (10 mg total) by mouth daily.  . metoprolol succinate (TOPROL-XL) 100 MG 24 hr tablet TAKE ONE TABLET BY MOUTH EVERY DAY. TAKEWITH OR IMMEDIATELY FOLLOWINGA MEAL  . montelukast (SINGULAIR) 10 MG tablet Take 1 tablet (10 mg total) by mouth at bedtime.  . pantoprazole (PROTONIX) 40 MG tablet Take 1 tablet (40 mg total) by mouth daily.  . rosuvastatin (CRESTOR) 20 MG tablet Take 1 tablet (20 mg total) by mouth daily.   No facility-administered encounter medications on file as of 04/18/2018.     Allergies (verified) Azor [amlodipine-olmesartan]   History: Past Medical History:  Diagnosis Date  . Hyperlipidemia   . Hypertension   . Toxic goiter    Past Surgical History:  Procedure Laterality Date  . CERVICAL FUSION    .  TUBAL LIGATION     Family History  Problem Relation Age of Onset  . Heart disease Mother        MI  . Heart attack Mother   . CVA Father   . Stroke Father   . Cancer Sister        OVARIAN  . Heart disease Brother        MI  . Heart attack Brother   . Cancer Brother   . Heart disease Brother        MI  . Cancer Sister   . Heart disease Sister    Social History   Socioeconomic History  . Marital status: Widowed    Spouse name: Not on file  . Number of children: 7  . Years of education: Not on file  . Highest education level: 10th grade  Occupational History  . 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 Occupation: Tobacco Farming  Social Needs  . Financial resource strain: Not hard at all  . Food insecurity:    Worry: Never true    Inability: Never true  . Transportation needs:    Medical: No    Non-medical: No  Tobacco Use  . Smoking status: Former Smoker    Last attempt to quit: 12/28/1978    Years since quitting: 39.3  . Smokeless tobacco: Never Used  Substance and Sexual Activity  . Alcohol use: No  . Drug use: No  . Sexual activity: Not on file  Lifestyle  . Physical activity:    Days per week: 2 days    Minutes per session: 30 min  . Stress: Not at all  Relationships  . Social connections:    Talks on phone: More than three times a week    Gets together: More than three times a week    Attends religious service: 1 to 4 times per year    Active member of club or organization: No    Attends meetings of clubs or organizations: Never    Relationship status: Widowed  Other Topics Concern  . Not on file  Social History Narrative  . Not on file    Tobacco Counseling Patient is a former smoker  Clinical Intake:  Pre-visit preparation completed: Yes  Pain : No/denies pain     BMI - recorded: 28.17 Nutritional Status: BMI 25 -29 Overweight Nutritional Risks: None Diabetes: No  How often do you need to have someone help you when you read instructions, pamphlets, or other written materials from your doctor or pharmacy?: 1 - Never What is the last grade level you completed in school?: 10th grade  Interpreter Needed?: No      Activities of Daily Living In your present state of health, do you have any difficulty performing the following activities: 04/18/2018  Hearing? N  Vision? Y  Comment Has had cataract surgery in the past  Difficulty concentrating or making decisions? N  Walking or climbing stairs? N  Dressing or bathing? N  Doing errands, shopping? N  Preparing Food and eating ? N  Using the Toilet? N  In the past six months, have you  accidently leaked urine? N  Do you have problems with loss of bowel control? N  Managing your Medications? N  Managing your Finances? N  Housekeeping or managing your Housekeeping? N  Some recent data might be hidden     Immunizations and Health Maintenance Immunization History  Administered Date(s) Administered  . Influenza Whole  09/27/2012  . Influenza, High Dose Seasonal PF 10/27/2017  . Influenza,inj,Quad PF,6+ Mos 09/29/2013, 10/03/2014, 10/02/2015, 11/25/2016  . Td 08/01/2004   Health Maintenance Due  Topic Date Due  . DEXA SCAN  09/12/2000  . PNA vac Low Risk Adult (1 of 2 - PCV13) 09/12/2000  . TETANUS/TDAP  08/01/2014    Patient is interested in updating immunizations after speaking with PCP at next visit.   Patient Care Team: Theodoro Clock as PCP - General (Physician Assistant) Marybelle Killings, MD as Consulting Physician (Orthopedic Surgery) Jacelyn Pi, MD as Consulting Physician (Endocrinology)  Indicate any recent Medical Services you may have received from other than [facility] providers in the past year (date may be approximate).     Assessment:   This is a routine wellness examination for Masaye.  Hearing/Vision screen No exam data present  Dietary issues and exercise activities discussed: Current Exercise Habits: The patient does not participate in regular exercise at present, Exercise limited by: respiratory conditions(s)  Goals    . Exercise 150 min/wk Moderate Activity      Depression Screen PHQ 2/9 Scores 04/18/2018 03/28/2018 10/27/2017 09/30/2017 06/28/2017 03/24/2017 03/12/2017  PHQ - 2 Score 0 0 0 0 0 0 0    Fall Risk Fall Risk  04/18/2018 03/28/2018 10/27/2017 09/30/2017 06/28/2017  Falls in the past year? No No No No No    Is the patient's home free of loose throw rugs in walkways, pet beds, electrical cords, etc?   yes      Grab bars in the bathroom? no      Handrails on the stairs?   yes      Adequate lighting?   yes    Cognitive  Function: MMSE - Mini Mental State Exam 04/18/2018  Orientation to time 5  Orientation to Place 5  Registration 3  Attention/ Calculation 5  Recall 3  Language- name 2 objects 2  Language- repeat 1  Language- follow 3 step command 3  Language- read & follow direction 1  Write a sentence 1  Copy design 1  Total score 30        Screening Tests Health Maintenance  Topic Date Due  . DEXA SCAN  09/12/2000  . PNA vac Low Risk Adult (1 of 2 - PCV13) 09/12/2000  . TETANUS/TDAP  08/01/2014  . INFLUENZA VACCINE  07/28/2018    Qualifies for Shingles Vaccine? Patient is interested in the shingles vaccine but the facility if out at this time. She will discuss with PCP at follow up visit and see if we have more in stock.   Cancer Screenings: Lung: Low Dose CT Chest recommended if Age 96-80 years, 30 pack-year currently smoking OR have quit w/in 15years. Patient does qualify. Breast: Up to date on Mammogram? No   Up to date of Bone Density/Dexa? No Colorectal: Patient doesn't qualify  Additional Screenings:  Hepatitis C Screening: Patient doesn't have a history of having this additional screening done. Will check with PCP    Plan:    I have personally reviewed and noted the following in the patient's chart:   . Medical and social history . Use of alcohol, tobacco or illicit drugs  . Current medications and supplements . Functional ability and status . Nutritional status . Physical activity . Advanced directives . List of other physicians . Hospitalizations, surgeries, and ER visits in previous 12 months . Vitals . Screenings to include cognitive, depression, and falls . Referrals and appointments  In addition, I  have reviewed and discussed with patient certain preventive protocols, quality metrics, and best practice recommendations. A written personalized care plan for preventive services as well as general preventive health recommendations were provided to patient. Discussed  health maintenance with patient and also updating immunizations. Patient is interested in having the shingrix but we didn't have that in stock. Will discuss with PCP when patient returns for follow up.      Rolena Infante, LPN   [id number]    I have reviewed and agree with the above AWV documentation.   Laroy Apple, MD [facility] 04/19/2018, 9:50 AM

## 2018-04-25 ENCOUNTER — Other Ambulatory Visit: Payer: Self-pay | Admitting: Physician Assistant

## 2018-04-25 DIAGNOSIS — E7849 Other hyperlipidemia: Secondary | ICD-10-CM

## 2018-04-27 ENCOUNTER — Other Ambulatory Visit: Payer: Self-pay | Admitting: Family Medicine

## 2018-04-27 ENCOUNTER — Other Ambulatory Visit: Payer: Self-pay | Admitting: Physician Assistant

## 2018-04-27 DIAGNOSIS — F419 Anxiety disorder, unspecified: Secondary | ICD-10-CM

## 2018-04-27 DIAGNOSIS — E039 Hypothyroidism, unspecified: Secondary | ICD-10-CM

## 2018-04-27 NOTE — Telephone Encounter (Signed)
Last seen 04/18/18  Rita Wells

## 2018-06-13 ENCOUNTER — Other Ambulatory Visit: Payer: Self-pay | Admitting: Physician Assistant

## 2018-06-13 DIAGNOSIS — I10 Essential (primary) hypertension: Secondary | ICD-10-CM

## 2018-06-23 ENCOUNTER — Other Ambulatory Visit: Payer: Self-pay | Admitting: Family Medicine

## 2018-06-23 DIAGNOSIS — K219 Gastro-esophageal reflux disease without esophagitis: Secondary | ICD-10-CM

## 2018-09-16 ENCOUNTER — Telehealth: Payer: Self-pay | Admitting: Physician Assistant

## 2018-09-16 MED ORDER — BISOPROLOL-HYDROCHLOROTHIAZIDE 10-6.25 MG PO TABS: 1.0000 | ORAL_TABLET | Freq: Every day | ORAL | 0 refills | Status: DC

## 2018-09-16 NOTE — Telephone Encounter (Signed)
Rx sent- daughter aware ntbs

## 2018-09-19 ENCOUNTER — Telehealth: Payer: Self-pay | Admitting: Physician Assistant

## 2018-09-19 NOTE — Telephone Encounter (Signed)
The ziac was supposed to be D/C'd bur got sent instead. Stop Ziac, continue metoprolol

## 2018-09-19 NOTE — Telephone Encounter (Signed)
Pharmacy aware

## 2018-09-19 NOTE — Telephone Encounter (Signed)
Stew has this pt now (just switched pharm) He is questioning the Ziac and Metoprolol together.  Can you check on this?

## 2018-09-19 NOTE — Telephone Encounter (Signed)
, °

## 2018-09-27 ENCOUNTER — Ambulatory Visit: Payer: Medicare HMO | Admitting: Physician Assistant

## 2018-09-28 ENCOUNTER — Other Ambulatory Visit: Payer: Self-pay | Admitting: Physician Assistant

## 2018-09-28 DIAGNOSIS — E039 Hypothyroidism, unspecified: Secondary | ICD-10-CM

## 2018-09-28 NOTE — Telephone Encounter (Signed)
What is the name of the medication? levothyroxine (SYNTHROID, LEVOTHROID) 75 MCG tablet  Have you contacted your pharmacy to request a refill? Yes   Which pharmacy would you like this sent to? [facility]    Patient notified that their request is being sent to the clinical staff for review and that they should receive a call once it is complete. If they do not receive a call within 24 hours they can check with their pharmacy or our office.

## 2018-09-29 NOTE — Telephone Encounter (Signed)
TC to [facility]. Pt picked up med yesterday

## 2018-10-05 ENCOUNTER — Ambulatory Visit (INDEPENDENT_AMBULATORY_CARE_PROVIDER_SITE_OTHER): Payer: Medicare HMO | Admitting: *Deleted

## 2018-10-05 DIAGNOSIS — Z23 Encounter for immunization: Secondary | ICD-10-CM | POA: Diagnosis not present

## 2018-10-11 ENCOUNTER — Ambulatory Visit: Payer: Medicare HMO | Admitting: Physician Assistant

## 2018-10-11 ENCOUNTER — Encounter: Payer: Self-pay | Admitting: Physician Assistant

## 2018-10-11 ENCOUNTER — Other Ambulatory Visit: Payer: Self-pay | Admitting: Physician Assistant

## 2018-10-11 DIAGNOSIS — E039 Hypothyroidism, unspecified: Secondary | ICD-10-CM | POA: Diagnosis not present

## 2018-10-11 DIAGNOSIS — I10 Essential (primary) hypertension: Secondary | ICD-10-CM

## 2018-10-11 DIAGNOSIS — F419 Anxiety disorder, unspecified: Secondary | ICD-10-CM

## 2018-10-11 MED ORDER — ALPRAZOLAM 0.25 MG PO TABS: 0.2500 mg | ORAL_TABLET | Freq: Two times a day (BID) | ORAL | 5 refills | Status: DC

## 2018-10-11 MED ORDER — LEVOTHYROXINE SODIUM 75 MCG PO TABS: 75.0000 ug | ORAL_TABLET | Freq: Every day | ORAL | 3 refills | Status: DC

## 2018-10-11 MED ORDER — CETIRIZINE HCL 10 MG PO TABS: 10.0000 mg | ORAL_TABLET | Freq: Every day | ORAL | 3 refills | Status: DC

## 2018-10-11 MED ORDER — METOPROLOL SUCCINATE ER 100 MG PO TB24: ORAL_TABLET | ORAL | 3 refills | Status: DC

## 2018-10-11 MED ORDER — CETIRIZINE HCL 10 MG PO TABS: 10.0000 mg | ORAL_TABLET | Freq: Every day | ORAL | 11 refills | Status: DC

## 2018-10-11 NOTE — Progress Notes (Signed)
BP (!) 161/90   Pulse 72   Temp (!) 97 F (36.1 C) (Oral)   Ht 5' 3"  (1.6 m)   Wt 162 lb 9.6 oz (73.8 kg)   BMI 28.80 kg/m    Subjective:    Patient ID: Rita Wells, female    DOB: 1933-01-01, 84 y.o.   MRN: [id number]  HPI: Rita Wells is a 84 y.o. female presenting on 10/11/2018 for Hyperlipidemia (6 month follow up ); Hypertension; and Hypothyroidism  This patient comes in for periodic recheck on medications and conditions including hypertension, hypothyroidism and anxiety.  She also still has some hoarseness related to her GERD.  It could also be related to her postnasal drip from allergies.  She does not feel that the Claritin is helping anymore we are going to try Zyrtec instead.  She is to let us know if it does not get better and we will make her nose and throat referral for her hoarseness.  She states it has been more than 20 years since she smokes..   All medications are reviewed today. There are no reports of any problems with the medications. All of the medical conditions are reviewed and updated.  Lab work is reviewed and will be ordered as medically necessary. There are no new problems reported with today's visit.   Past Medical History:  Diagnosis Date  . Hyperlipidemia   . Hypertension   . Toxic goiter    Relevant past medical, surgical, family and social history reviewed and updated as indicated. Interim medical history since our last visit reviewed. Allergies and medications reviewed and updated. DATA REVIEWED: CHART IN EPIC  Family History reviewed for pertinent findings.  Review of Systems  Constitutional: Negative.  Negative for activity change, fatigue and fever.  HENT: Positive for congestion and voice change. Negative for sore throat.   Eyes: Negative.   Respiratory: Negative.  Negative for cough.   Cardiovascular: Negative.  Negative for chest pain.  Gastrointestinal: Negative.  Negative for abdominal pain.  Endocrine: Negative.     Genitourinary: Negative.  Negative for dysuria.  Musculoskeletal: Positive for arthralgias and back pain.  Skin: Negative.   Neurological: Negative.     Allergies as of 10/11/2018      Reactions   Azor [amlodipine-olmesartan]       Medication List        Accurate as of 10/11/18  1:43 PM. Always use your most recent med list.          ALPRAZolam 0.25 MG tablet Commonly known as:  XANAX Take 1 tablet (0.25 mg total) by mouth 2 (two) times daily.   cetirizine 10 MG tablet Commonly known as:  ZYRTEC Take 1 tablet (10 mg total) by mouth daily.   fluticasone 50 MCG/ACT nasal spray Commonly known as:  FLONASE Place 2 sprays into both nostrils daily.   gabapentin 100 MG capsule Commonly known as:  NEURONTIN Take 1-3 capsules (100-300 mg total) by mouth 3 (three) times daily.   levothyroxine 75 MCG tablet Commonly known as:  SYNTHROID, LEVOTHROID Take 1 tablet (75 mcg total) by mouth daily.   metoprolol succinate 100 MG 24 hr tablet Commonly known as:  TOPROL-XL TAKE ONE TABLET BY MOUTH EVERY DAY. TAKEWITH OR IMMEDIATELY FOLLOWINGA MEAL.   montelukast 10 MG tablet Commonly known as:  SINGULAIR Take 1 tablet (10 mg total) by mouth at bedtime.   pantoprazole 40 MG tablet Commonly known as:  PROTONIX TAKE ONE TABLET BY MOUTH EVERY DAY  rosuvastatin 20 MG tablet Commonly known as:  CRESTOR TAKE ONE TABLET BY MOUTH EVERY DAY          Objective:    BP (!) 161/90   Pulse 72   Temp (!) 97 F (36.1 C) (Oral)   Ht 5' 3"  (1.6 m)   Wt 162 lb 9.6 oz (73.8 kg)   BMI 28.80 kg/m   Allergies  Allergen Reactions  . Azor [Amlodipine-Olmesartan]     Wt Readings from Last 3 Encounters:  10/11/18 162 lb 9.6 oz (73.8 kg)  04/18/18 157 lb (71.2 kg)  03/28/18 159 lb (72.1 kg)    Physical Exam  Constitutional: She is oriented to person, place, and time. She appears well-developed and well-nourished.  HENT:  Head: Normocephalic and atraumatic.  Right Ear: Tympanic  membrane, external ear and ear canal normal.  Left Ear: Tympanic membrane, external ear and ear canal normal.  Nose: Nose normal. No rhinorrhea.  Mouth/Throat: Oropharynx is clear and moist and mucous membranes are normal. No oropharyngeal exudate or posterior oropharyngeal erythema.  Eyes: Pupils are equal, round, and reactive to light. Conjunctivae and EOM are normal.  Neck: Normal range of motion. Neck supple.  Cardiovascular: Normal rate, regular rhythm, normal heart sounds and intact distal pulses.  Pulmonary/Chest: Effort normal and breath sounds normal.  Abdominal: Soft. Bowel sounds are normal.  Neurological: She is alert and oriented to person, place, and time. She has normal reflexes.  Skin: Skin is warm and dry. No rash noted.  Psychiatric: She has a normal mood and affect. Her behavior is normal. Judgment and thought content normal.    Results for orders placed or performed in visit on 03/28/18  CMP14+EGFR  Result Value Ref Range   Glucose 98 65 - 99 mg/dL   BUN 11 8 - 27 mg/dL   Creatinine, Ser 1.20 (H) 0.57 - 1.00 mg/dL   GFR calc non Af Amer 42 (L) >59 mL/min/1.73   GFR calc Af Amer 49 (L) >59 mL/min/1.73   BUN/Creatinine Ratio 9 (L) 12 - 28   Sodium 139 134 - 144 mmol/L   Potassium 4.6 3.5 - 5.2 mmol/L   Chloride 101 96 - 106 mmol/L   CO2 24 20 - 29 mmol/L   Calcium 9.2 8.7 - 10.3 mg/dL   Total Protein 6.7 6.0 - 8.5 g/dL   Albumin 4.2 3.5 - 4.7 g/dL   Globulin, Total 2.5 1.5 - 4.5 g/dL   Albumin/Globulin Ratio 1.7 1.2 - 2.2   Bilirubin Total 0.4 0.0 - 1.2 mg/dL   Alkaline Phosphatase 105 39 - 117 IU/L   AST 18 0 - 40 IU/L   ALT 15 0 - 32 IU/L  CBC with Differential/Platelet  Result Value Ref Range   WBC 8.2 3.4 - 10.8 x10E3/uL   RBC 5.25 3.77 - 5.28 x10E6/uL   Hemoglobin 14.2 11.1 - 15.9 g/dL   Hematocrit 43.7 34.0 - 46.6 %   MCV 83 79 - 97 fL   MCH 27.0 26.6 - 33.0 pg   MCHC 32.5 31.5 - 35.7 g/dL   RDW 15.4 12.3 - 15.4 %   Platelets 237 150 - 379  x10E3/uL   Neutrophils 62 Not Estab. %   Lymphs 23 Not Estab. %   Monocytes 8 Not Estab. %   Eos 7 Not Estab. %   Basos 0 Not Estab. %   Neutrophils Absolute 5.0 1.4 - 7.0 x10E3/uL   Lymphocytes Absolute 1.9 0.7 - 3.1 x10E3/uL   Monocytes Absolute 0.7  0.1 - 0.9 x10E3/uL   EOS (ABSOLUTE) 0.6 (H) 0.0 - 0.4 x10E3/uL   Basophils Absolute 0.0 0.0 - 0.2 x10E3/uL   Immature Granulocytes 0 Not Estab. %   Immature Grans (Abs) 0.0 0.0 - 0.1 x10E3/uL  TSH  Result Value Ref Range   TSH 4.130 0.450 - 4.500 uIU/mL      Assessment & Plan:   1. Anxiety - ALPRAZolam (XANAX) 0.25 MG tablet; Take 1 tablet (0.25 mg total) by mouth 2 (two) times daily.  Dispense: 60 tablet; Refill: 5  2. Essential hypertension - metoprolol succinate (TOPROL-XL) 100 MG 24 hr tablet; TAKE ONE TABLET BY MOUTH EVERY DAY. TAKEWITH OR IMMEDIATELY FOLLOWINGA MEAL.  Dispense: 90 tablet; Refill: 3 - CMP14+EGFR - Microalbumin / creatinine urine ratio - CBC with Differential/Platelet  3. Hypothyroidism, unspecified type - levothyroxine (SYNTHROID, LEVOTHROID) 75 MCG tablet; Take 1 tablet (75 mcg total) by mouth daily.  Dispense: 90 tablet; Refill: 3 - TSH   Continue all other maintenance medications as listed above.  Follow up plan: Return in about 6 months (around 04/12/2019) for recheck.  Educational handout given for [place] PA-C [facility] [address]  [place], [place] [address] [phone]   10/11/2018, 1:43 PM

## 2018-10-12 LAB — CMP14+EGFR
ALBUMIN: 4.3 g/dL (ref 3.5–4.7)
ALT: 12 IU/L (ref 0–32)
AST: 18 IU/L (ref 0–40)
Albumin/Globulin Ratio: 2 (ref 1.2–2.2)
Alkaline Phosphatase: 89 IU/L (ref 39–117)
BUN / CREAT RATIO: 14 (ref 12–28)
BUN: 17 mg/dL (ref 8–27)
Bilirubin Total: 0.3 mg/dL (ref 0.0–1.2)
CO2: 21 mmol/L (ref 20–29)
CREATININE: 1.24 mg/dL — AB (ref 0.57–1.00)
Calcium: 9.3 mg/dL (ref 8.7–10.3)
Chloride: 102 mmol/L (ref 96–106)
GFR calc non Af Amer: 40 mL/min/{1.73_m2} — ABNORMAL LOW (ref >59)
GFR, EST AFRICAN AMERICAN: 46 mL/min/{1.73_m2} — AB (ref >59)
Globulin, Total: 2.2 g/dL (ref 1.5–4.5)
Glucose: 95 mg/dL (ref 65–99)
Potassium: 4.5 mmol/L (ref 3.5–5.2)
Sodium: 141 mmol/L (ref 134–144)
Total Protein: 6.5 g/dL (ref 6.0–8.5)

## 2018-10-12 LAB — CBC WITH DIFFERENTIAL/PLATELET
Basophils Absolute: 0 10*3/uL (ref 0.0–0.2)
Basos: 1 %
EOS (ABSOLUTE): 0.5 10*3/uL — ABNORMAL HIGH (ref 0.0–0.4)
EOS: 8 %
HEMATOCRIT: 41.4 % (ref 34.0–46.6)
HEMOGLOBIN: 14 g/dL (ref 11.1–15.9)
IMMATURE GRANS (ABS): 0 10*3/uL (ref 0.0–0.1)
Immature Granulocytes: 0 %
LYMPHS ABS: 1.5 10*3/uL (ref 0.7–3.1)
LYMPHS: 24 %
MCH: 28.3 pg (ref 26.6–33.0)
MCHC: 33.8 g/dL (ref 31.5–35.7)
MCV: 84 fL (ref 79–97)
MONOCYTES: 9 %
Monocytes Absolute: 0.6 10*3/uL (ref 0.1–0.9)
NEUTROS ABS: 3.7 10*3/uL (ref 1.4–7.0)
Neutrophils: 58 %
Platelets: 219 10*3/uL (ref 150–450)
RBC: 4.95 x10E6/uL (ref 3.77–5.28)
RDW: 14.5 % (ref 12.3–15.4)
WBC: 6.4 10*3/uL (ref 3.4–10.8)

## 2018-10-12 LAB — MICROALBUMIN / CREATININE URINE RATIO
Creatinine, Urine: 47.2 mg/dL
MICROALBUM., U, RANDOM: 17.7 ug/mL
Microalb/Creat Ratio: 37.5 mg/g creat — ABNORMAL HIGH (ref 0.0–30.0)

## 2018-10-12 LAB — TSH: TSH: 3.85 u[IU]/mL (ref 0.450–4.500)

## 2018-10-18 ENCOUNTER — Ambulatory Visit: Payer: Medicare HMO | Admitting: Physician Assistant

## 2019-04-12 ENCOUNTER — Ambulatory Visit: Payer: Medicare HMO | Admitting: Physician Assistant

## 2019-04-18 ENCOUNTER — Ambulatory Visit (INDEPENDENT_AMBULATORY_CARE_PROVIDER_SITE_OTHER): Payer: Medicare HMO | Admitting: Physician Assistant

## 2019-04-18 ENCOUNTER — Other Ambulatory Visit: Payer: Self-pay

## 2019-04-18 DIAGNOSIS — F411 Generalized anxiety disorder: Secondary | ICD-10-CM | POA: Diagnosis not present

## 2019-04-18 NOTE — Progress Notes (Signed)
No answer 2:38 pm No answer 2:46 pm 3:07 pm no answer 3:11 PM no answer   Attempted to call the patient 4 times and there was never any answer

## 2019-04-19 ENCOUNTER — Encounter: Payer: Self-pay | Admitting: Physician Assistant

## 2019-04-19 ENCOUNTER — Other Ambulatory Visit: Payer: Self-pay

## 2019-04-19 ENCOUNTER — Ambulatory Visit (INDEPENDENT_AMBULATORY_CARE_PROVIDER_SITE_OTHER): Payer: Medicare HMO | Admitting: Physician Assistant

## 2019-04-19 ENCOUNTER — Other Ambulatory Visit: Payer: Self-pay | Admitting: Physician Assistant

## 2019-04-19 DIAGNOSIS — J309 Allergic rhinitis, unspecified: Secondary | ICD-10-CM | POA: Diagnosis not present

## 2019-04-19 DIAGNOSIS — E7849 Other hyperlipidemia: Secondary | ICD-10-CM | POA: Diagnosis not present

## 2019-04-19 DIAGNOSIS — F419 Anxiety disorder, unspecified: Secondary | ICD-10-CM

## 2019-04-19 MED ORDER — ALPRAZOLAM 0.25 MG PO TABS: 0.2500 mg | ORAL_TABLET | Freq: Two times a day (BID) | ORAL | 0 refills | Status: DC

## 2019-04-19 MED ORDER — ROSUVASTATIN CALCIUM 20 MG PO TABS: 20.0000 mg | ORAL_TABLET | Freq: Every day | ORAL | 0 refills | Status: DC

## 2019-04-19 MED ORDER — MONTELUKAST SODIUM 10 MG PO TABS: 10.0000 mg | ORAL_TABLET | Freq: Every day | ORAL | 3 refills | Status: DC

## 2019-04-19 MED ORDER — FLUTICASONE PROPIONATE 50 MCG/ACT NA SUSP: 2.0000 | Freq: Every day | NASAL | 3 refills | Status: DC

## 2019-04-19 NOTE — Progress Notes (Signed)
    Telephone visit  3:01 pm no answer 3:02 pm answered  Subjective: Rita Wells, allergic rhinitis, anxiety PCP: Terald Sleeper, PA-C Rita Wells is a 85 y.o. female calls for telephone consult today. Patient provides verbal consent for consult held via phone.  Patient is identified with 2 separate identifiers.  At this time the entire area is on COVID-19 social distancing and stay home orders are in place.  Patient is of higher risk and therefore we are performing this by a virtual method.  Location of provider: Home Location of patient: No Others present for call: Home  The patient is in need of a periodic recheck on her chronic medical conditions.  She does have hyperlipidemia, allergic rhinitis, anxiety.  She does need some refills sent in.  We will plan for her to come back in June or July for her check in the office and for lab updates.  She reports that overall she is doing fairly well.  She has had a significant amount of allergies to spring and this worsens her hoarseness.   ROS: Per HPI  Allergies  Allergen Reactions  . Azor [Amlodipine-Olmesartan]    Past Medical History:  Diagnosis Date  . Hyperlipidemia   . Hypertension   . Toxic goiter     Current Outpatient Medications:  .  ALPRAZolam (XANAX) 0.25 MG tablet, Take 1 tablet (0.25 mg total) by mouth 2 (two) times daily., Disp: 60 tablet, Rfl: 5 .  cetirizine (ZYRTEC ALLERGY) 10 MG tablet, Take 1 tablet (10 mg total) by mouth daily., Disp: 90 tablet, Rfl: 3 .  fluticasone (FLONASE) 50 MCG/ACT nasal spray, Place 2 sprays into both nostrils daily., Disp: 48 g, Rfl: 3 .  gabapentin (NEURONTIN) 100 MG capsule, Take 1-3 capsules (100-300 mg total) by mouth 3 (three) times daily., Disp: 90 capsule, Rfl: 2 .  levothyroxine (SYNTHROID, LEVOTHROID) 75 MCG tablet, Take 1 tablet (75 mcg total) by mouth daily., Disp: 90 tablet, Rfl: 3 .  metoprolol succinate (TOPROL-XL) 100 MG 24 hr tablet, TAKE ONE TABLET BY  MOUTH EVERY DAY. TAKEWITH OR IMMEDIATELY FOLLOWINGA MEAL., Disp: 90 tablet, Rfl: 3 .  montelukast (SINGULAIR) 10 MG tablet, Take 1 tablet (10 mg total) by mouth at bedtime., Disp: 90 tablet, Rfl: 3 .  pantoprazole (PROTONIX) 40 MG tablet, TAKE ONE TABLET BY MOUTH EVERY DAY, Disp: 30 tablet, Rfl: 3 .  rosuvastatin (CRESTOR) 20 MG tablet, TAKE ONE TABLET BY MOUTH EVERY DAY, Disp: 90 tablet, Rfl: 3  Assessment/ Plan: 85 y.o. female   1. Other hyperlipidemia - rosuvastatin (CRESTOR) 20 MG tablet; Take 1 tablet (20 mg total) by mouth daily.  Dispense: 90 tablet; Refill: 0  2. Allergic rhinitis, unspecified seasonality, unspecified trigger - montelukast (SINGULAIR) 10 MG tablet; Take 1 tablet (10 mg total) by mouth at bedtime.  Dispense: 90 tablet; Refill: 3 - fluticasone (FLONASE) 50 MCG/ACT nasal spray; Place 2 sprays into both nostrils daily.  Dispense: 48 g; Refill: 3  3. Anxiety - ALPRAZolam (XANAX) 0.25 MG tablet; Take 1 tablet (0.25 mg total) by mouth 2 (two) times daily.  Dispense: 60 tablet; Refill: 0   Start time: 3:02 PM End time: 3:10 PM  No orders of the defined types were placed in this encounter.   Particia Nearing PA-C [facility] [phone]

## 2019-06-30 ENCOUNTER — Other Ambulatory Visit: Payer: Self-pay | Admitting: Physician Assistant

## 2019-06-30 DIAGNOSIS — F419 Anxiety disorder, unspecified: Secondary | ICD-10-CM

## 2019-07-03 ENCOUNTER — Encounter: Payer: Self-pay | Admitting: Physician Assistant

## 2019-07-03 ENCOUNTER — Other Ambulatory Visit: Payer: Self-pay

## 2019-07-03 ENCOUNTER — Ambulatory Visit (INDEPENDENT_AMBULATORY_CARE_PROVIDER_SITE_OTHER): Payer: Medicare HMO | Admitting: Physician Assistant

## 2019-07-03 VITALS — BP 144/78 | HR 73 | Temp 96.9°F | Ht 63.0 in | Wt 160.0 lb

## 2019-07-03 DIAGNOSIS — E7849 Other hyperlipidemia: Secondary | ICD-10-CM

## 2019-07-03 DIAGNOSIS — F419 Anxiety disorder, unspecified: Secondary | ICD-10-CM

## 2019-07-03 DIAGNOSIS — I10 Essential (primary) hypertension: Secondary | ICD-10-CM

## 2019-07-03 DIAGNOSIS — E039 Hypothyroidism, unspecified: Secondary | ICD-10-CM | POA: Diagnosis not present

## 2019-07-03 MED ORDER — ALPRAZOLAM 0.25 MG PO TABS: 0.2500 mg | ORAL_TABLET | Freq: Two times a day (BID) | ORAL | 5 refills | Status: DC

## 2019-07-03 MED ORDER — ROSUVASTATIN CALCIUM 20 MG PO TABS: 20.0000 mg | ORAL_TABLET | Freq: Every day | ORAL | 3 refills | Status: DC

## 2019-07-03 NOTE — Progress Notes (Signed)
1 LME    BP (!) 144/78   Pulse 73   Temp (!) 96.9 F (36.1 C) (Oral)   Ht 5' 3"  (1.6 m)   Wt 160 lb (72.6 kg)   BMI 28.34 kg/m    Subjective:    Patient ID: Rita Wells, female    DOB: 1933/07/12, 85 y.o.   MRN: [id number]  HPI: Rita Wells is a 85 y.o. female presenting on 07/03/2019 for Medical Management of Chronic Issues  This patient comes in for.  Recheck on her chronic medical conditions which do include anxiety, allergies, hypothyroidism, hypertension, GERD, hyperlipidemia.  She is due some lab work at this time.  We will also include a microalbumin today to look for chronic kidney disease.  She denies any significant issues at this time.  Overall she is feeling quite well.  ANXIETY ASSESSMENT Cause of anxiety: GAD This patient returns for a  month recheck on narcotic use for the above named condition(s)  Current medications- alprazolam .25 1 daily Other medications tried: zoloft, paxil, celexa Medication side effects- none Any concerns- no Any change in general medical condition- no Effectiveness of current meds- good PMP AWARE website reviewed: Yes Any suspicious activity on PMP Aware: No LME daily dose: 1, less than  Contract on file 10/14/18 Last UDS  Will obtain at next visit  History of overdose or risk of abuse no   Past Medical History:  Diagnosis Date  . Hyperlipidemia   . Hypertension   . Toxic goiter    Relevant past medical, surgical, family and social history reviewed and updated as indicated. Interim medical history since our last visit reviewed. Allergies and medications reviewed and updated. DATA REVIEWED: CHART IN EPIC  Family History reviewed for pertinent findings.  Review of Systems  Constitutional: Negative.   HENT: Negative.   Eyes: Negative.   Respiratory: Negative.   Gastrointestinal: Negative.   Genitourinary: Negative.     Allergies as of 07/03/2019      Reactions   Azor [amlodipine-olmesartan]       Medication  List       Accurate as of July 03, 2019 11:59 PM. If you have any questions, ask your nurse or doctor.        STOP taking these medications   gabapentin 100 MG capsule Commonly known as: NEURONTIN Stopped by: Terald Sleeper, PA-C   montelukast 10 MG tablet Commonly known as: SINGULAIR Stopped by: Terald Sleeper, PA-C   pantoprazole 40 MG tablet Commonly known as: PROTONIX Stopped by: Terald Sleeper, PA-C     TAKE these medications   ALPRAZolam 0.25 MG tablet Commonly known as: XANAX Take 1 tablet (0.25 mg total) by mouth 2 (two) times daily.   cetirizine 10 MG tablet Commonly known as: ZyrTEC Allergy Take 1 tablet (10 mg total) by mouth daily.   fluticasone 50 MCG/ACT nasal spray Commonly known as: FLONASE Place 2 sprays into both nostrils daily.   levothyroxine 75 MCG tablet Commonly known as: SYNTHROID Take 1 tablet (75 mcg total) by mouth daily.   metoprolol succinate 100 MG 24 hr tablet Commonly known as: TOPROL-XL TAKE ONE TABLET BY MOUTH EVERY DAY. TAKEWITH OR IMMEDIATELY FOLLOWINGA MEAL.   rosuvastatin 20 MG tablet Commonly known as: CRESTOR Take 1 tablet (20 mg total) by mouth daily.          Objective:    BP (!) 144/78   Pulse 73   Temp (!) 96.9 F (36.1 C) (Oral)  Ht 5' 3"  (1.6 m)   Wt 160 lb (72.6 kg)   BMI 28.34 kg/m   Allergies  Allergen Reactions  . Azor [Amlodipine-Olmesartan]     Wt Readings from Last 3 Encounters:  07/03/19 160 lb (72.6 kg)  10/11/18 162 lb 9.6 oz (73.8 kg)  04/18/18 157 lb (71.2 kg)    Physical Exam Constitutional:      Appearance: She is well-developed.  HENT:     Head: Normocephalic and atraumatic.  Eyes:     Conjunctiva/sclera: Conjunctivae normal.     Pupils: Pupils are equal, round, and reactive to light.  Cardiovascular:     Rate and Rhythm: Normal rate and regular rhythm.     Heart sounds: Normal heart sounds.  Pulmonary:     Effort: Pulmonary effort is normal.     Breath sounds: Normal breath  sounds.  Abdominal:     General: Bowel sounds are normal.     Palpations: Abdomen is soft.  Skin:    General: Skin is warm and dry.     Findings: No rash.  Neurological:     Mental Status: She is alert and oriented to person, place, and time.     Deep Tendon Reflexes: Reflexes are normal and symmetric.  Psychiatric:        Behavior: Behavior normal.        Thought Content: Thought content normal.        Judgment: Judgment normal.     Results for orders placed or performed in visit on 07/03/19  CMP14+EGFR  Result Value Ref Range   Glucose 97 65 - 99 mg/dL   BUN 16 8 - 27 mg/dL   Creatinine, Ser 1.49 (H) 0.57 - 1.00 mg/dL   GFR calc non Af Amer 32 (L) >59 mL/min/1.73   GFR calc Af Amer 37 (L) >59 mL/min/1.73   BUN/Creatinine Ratio 11 (L) 12 - 28   Sodium 136 134 - 144 mmol/L   Potassium 4.4 3.5 - 5.2 mmol/L   Chloride 102 96 - 106 mmol/L   CO2 21 20 - 29 mmol/L   Calcium 9.0 8.7 - 10.3 mg/dL   Total Protein 6.7 6.0 - 8.5 g/dL   Albumin 4.2 3.6 - 4.6 g/dL   Globulin, Total 2.5 1.5 - 4.5 g/dL   Albumin/Globulin Ratio 1.7 1.2 - 2.2   Bilirubin Total 0.6 0.0 - 1.2 mg/dL   Alkaline Phosphatase 82 39 - 117 IU/L   AST 15 0 - 40 IU/L   ALT 11 0 - 32 IU/L  CBC with Differential/Platelet  Result Value Ref Range   WBC 7.4 3.4 - 10.8 x10E3/uL   RBC 5.07 3.77 - 5.28 x10E6/uL   Hemoglobin 13.9 11.1 - 15.9 g/dL   Hematocrit 42.7 34.0 - 46.6 %   MCV 84 79 - 97 fL   MCH 27.4 26.6 - 33.0 pg   MCHC 32.6 31.5 - 35.7 g/dL   RDW 14.8 11.7 - 15.4 %   Platelets 239 150 - 450 x10E3/uL   Neutrophils 58 Not Estab. %   Lymphs 25 Not Estab. %   Monocytes 9 Not Estab. %   Eos 7 Not Estab. %   Basos 1 Not Estab. %   Neutrophils Absolute 4.3 1.4 - 7.0 x10E3/uL   Lymphocytes Absolute 1.9 0.7 - 3.1 x10E3/uL   Monocytes Absolute 0.7 0.1 - 0.9 x10E3/uL   EOS (ABSOLUTE) 0.6 (H) 0.0 - 0.4 x10E3/uL   Basophils Absolute 0.0 0.0 - 0.2 x10E3/uL   Immature Granulocytes  0 Not Estab. %   Immature Grans  (Abs) 0.0 0.0 - 0.1 x10E3/uL  Lipid panel  Result Value Ref Range   Cholesterol, Total 168 100 - 199 mg/dL   Triglycerides 210 (H) 0 - 149 mg/dL   HDL 47 >39 mg/dL   VLDL Cholesterol Cal 42 (H) 5 - 40 mg/dL   LDL Calculated 79 0 - 99 mg/dL   Chol/HDL Ratio 3.6 0.0 - 4.4 ratio  TSH  Result Value Ref Range   TSH 3.310 0.450 - 4.500 uIU/mL  Microalbumin / creatinine urine ratio  Result Value Ref Range   Creatinine, Urine 45.0 Not Estab. mg/dL   Microalbumin, Urine 26.7 Not Estab. ug/mL   Microalb/Creat Ratio 59 (H) 0 - 29 mg/g creat      Assessment & Plan:   1. Anxiety - ALPRAZolam (XANAX) 0.25 MG tablet; Take 1 tablet (0.25 mg total) by mouth 2 (two) times daily.  Dispense: 60 tablet; Refill: 5  2. Other hyperlipidemia - rosuvastatin (CRESTOR) 20 MG tablet; Take 1 tablet (20 mg total) by mouth daily.  Dispense: 90 tablet; Refill: 3 - CMP14+EGFR - Lipid panel  3. Hypothyroidism, unspecified type - CMP14+EGFR - CBC with Differential/Platelet - Lipid panel - TSH - Microalbumin / creatinine urine ratio - levothyroxine (SYNTHROID) 75 MCG tablet; Take 1 tablet (75 mcg total) by mouth daily.  Dispense: 90 tablet; Refill: 3  4. Essential hypertension - CMP14+EGFR - CBC with Differential/Platelet - Lipid panel - TSH - Microalbumin / creatinine urine ratio   Continue all other maintenance medications as listed above.  Follow up plan: Return in about 6 months (around 01/03/2020).  Educational handout given for [place] PA-C [place] [facility] [address]  [place], [place] [address] [phone]   07/10/2019, 2:01 PM

## 2019-07-04 ENCOUNTER — Other Ambulatory Visit: Payer: Self-pay | Admitting: *Deleted

## 2019-07-04 DIAGNOSIS — R7989 Other specified abnormal findings of blood chemistry: Secondary | ICD-10-CM

## 2019-07-04 LAB — CBC WITH DIFFERENTIAL/PLATELET
Basophils Absolute: 0 10*3/uL (ref 0.0–0.2)
Basos: 1 %
EOS (ABSOLUTE): 0.6 10*3/uL — ABNORMAL HIGH (ref 0.0–0.4)
Eos: 7 %
Hematocrit: 42.7 % (ref 34.0–46.6)
Hemoglobin: 13.9 g/dL (ref 11.1–15.9)
Immature Grans (Abs): 0 10*3/uL (ref 0.0–0.1)
Immature Granulocytes: 0 %
Lymphocytes Absolute: 1.9 10*3/uL (ref 0.7–3.1)
Lymphs: 25 %
MCH: 27.4 pg (ref 26.6–33.0)
MCHC: 32.6 g/dL (ref 31.5–35.7)
MCV: 84 fL (ref 79–97)
Monocytes Absolute: 0.7 10*3/uL (ref 0.1–0.9)
Monocytes: 9 %
Neutrophils Absolute: 4.3 10*3/uL (ref 1.4–7.0)
Neutrophils: 58 %
Platelets: 239 10*3/uL (ref 150–450)
RBC: 5.07 x10E6/uL (ref 3.77–5.28)
RDW: 14.8 % (ref 11.7–15.4)
WBC: 7.4 10*3/uL (ref 3.4–10.8)

## 2019-07-04 LAB — MICROALBUMIN / CREATININE URINE RATIO
Creatinine, Urine: 45 mg/dL
Microalb/Creat Ratio: 59 mg/g creat — ABNORMAL HIGH (ref 0–29)
Microalbumin, Urine: 26.7 ug/mL

## 2019-07-04 LAB — CMP14+EGFR
ALT: 11 IU/L (ref 0–32)
AST: 15 IU/L (ref 0–40)
Albumin/Globulin Ratio: 1.7 (ref 1.2–2.2)
Albumin: 4.2 g/dL (ref 3.6–4.6)
Alkaline Phosphatase: 82 IU/L (ref 39–117)
BUN/Creatinine Ratio: 11 — ABNORMAL LOW (ref 12–28)
BUN: 16 mg/dL (ref 8–27)
Bilirubin Total: 0.6 mg/dL (ref 0.0–1.2)
CO2: 21 mmol/L (ref 20–29)
Calcium: 9 mg/dL (ref 8.7–10.3)
Chloride: 102 mmol/L (ref 96–106)
Creatinine, Ser: 1.49 mg/dL — ABNORMAL HIGH (ref 0.57–1.00)
GFR calc Af Amer: 37 mL/min/{1.73_m2} — ABNORMAL LOW (ref >59)
GFR calc non Af Amer: 32 mL/min/{1.73_m2} — ABNORMAL LOW (ref >59)
Globulin, Total: 2.5 g/dL (ref 1.5–4.5)
Glucose: 97 mg/dL (ref 65–99)
Potassium: 4.4 mmol/L (ref 3.5–5.2)
Sodium: 136 mmol/L (ref 134–144)
Total Protein: 6.7 g/dL (ref 6.0–8.5)

## 2019-07-04 LAB — LIPID PANEL
Chol/HDL Ratio: 3.6 ratio (ref 0.0–4.4)
Cholesterol, Total: 168 mg/dL (ref 100–199)
HDL: 47 mg/dL (ref >39)
LDL Calculated: 79 mg/dL (ref 0–99)
Triglycerides: 210 mg/dL — ABNORMAL HIGH (ref 0–149)
VLDL Cholesterol Cal: 42 mg/dL — ABNORMAL HIGH (ref 5–40)

## 2019-07-04 LAB — TSH: TSH: 3.31 u[IU]/mL (ref 0.450–4.500)

## 2019-07-10 MED ORDER — LEVOTHYROXINE SODIUM 75 MCG PO TABS: 75.0000 ug | ORAL_TABLET | Freq: Every day | ORAL | 3 refills | Status: DC

## 2019-07-21 ENCOUNTER — Ambulatory Visit (INDEPENDENT_AMBULATORY_CARE_PROVIDER_SITE_OTHER): Payer: Medicare HMO | Admitting: Physician Assistant

## 2019-07-21 ENCOUNTER — Encounter: Payer: Self-pay | Admitting: Physician Assistant

## 2019-07-21 ENCOUNTER — Other Ambulatory Visit: Payer: Self-pay

## 2019-07-21 DIAGNOSIS — R42 Dizziness and giddiness: Secondary | ICD-10-CM | POA: Diagnosis not present

## 2019-07-21 DIAGNOSIS — H9313 Tinnitus, bilateral: Secondary | ICD-10-CM | POA: Diagnosis not present

## 2019-07-21 MED ORDER — MECLIZINE HCL 12.5 MG PO TABS: 12.5000 mg | ORAL_TABLET | Freq: Three times a day (TID) | ORAL | 0 refills | Status: DC | PRN

## 2019-07-21 NOTE — Progress Notes (Signed)
     Telephone visit  Subjective: CC: Dizziness PCP: Terald Sleeper, PA-C Rita Wells is a 85 y.o. female calls for telephone consult today. Patient provides verbal consent for consult held via phone.  Patient is identified with 2 separate identifiers.  At this time the entire area is on COVID-19 social distancing and stay home orders are in place.  Patient is of higher risk and therefore we are performing this by a virtual method.  Location of patient: Home Location of provider: HOME Others present for call: Negative  This patient reports that for about 2 weeks she has been having some vertigo and ringing in her ears.  She reports that the ringing in the ears is worse at night when everything is quiet.  She has not fallen.  However she takes her time getting up.  She states she was working outside and got very hot and it has been ever since then.  She is trying to hydrate well.  She has not had no changes in her medication.  She has had issues in the past with vertigo.  She denies any nausea or vomiting.  She is eating and drinking well.  She denies any fever or chills.    ROS: Per HPI  Allergies  Allergen Reactions  . Azor [Amlodipine-Olmesartan]    Past Medical History:  Diagnosis Date  . Hyperlipidemia   . Hypertension   . Toxic goiter     Current Outpatient Medications:  .  ALPRAZolam (XANAX) 0.25 MG tablet, Take 1 tablet (0.25 mg total) by mouth 2 (two) times daily., Disp: 60 tablet, Rfl: 5 .  cetirizine (ZYRTEC ALLERGY) 10 MG tablet, Take 1 tablet (10 mg total) by mouth daily., Disp: 90 tablet, Rfl: 3 .  fluticasone (FLONASE) 50 MCG/ACT nasal spray, Place 2 sprays into both nostrils daily., Disp: 48 g, Rfl: 3 .  levothyroxine (SYNTHROID) 75 MCG tablet, Take 1 tablet (75 mcg total) by mouth daily., Disp: 90 tablet, Rfl: 3 .  meclizine (ANTIVERT) 12.5 MG tablet, Take 1 tablet (12.5 mg total) by mouth 3 (three) times daily as needed for dizziness., Disp: 30 tablet,  Rfl: 0 .  metoprolol succinate (TOPROL-XL) 100 MG 24 hr tablet, TAKE ONE TABLET BY MOUTH EVERY DAY. TAKEWITH OR IMMEDIATELY FOLLOWINGA MEAL., Disp: 90 tablet, Rfl: 3 .  rosuvastatin (CRESTOR) 20 MG tablet, Take 1 tablet (20 mg total) by mouth daily., Disp: 90 tablet, Rfl: 3  Assessment/ Plan: 85 y.o. female   1. Vertigo - meclizine (ANTIVERT) 12.5 MG tablet; Take 1 tablet (12.5 mg total) by mouth 3 (three) times daily as needed for dizziness.  Dispense: 30 tablet; Refill: 0  2. Tinnitus of both ears - meclizine (ANTIVERT) 12.5 MG tablet; Take 1 tablet (12.5 mg total) by mouth 3 (three) times daily as needed for dizziness.  Dispense: 30 tablet; Refill: 0   No follow-ups on file.  Continue all other maintenance medications as listed above.  Start time: 10:07 AM End time: 10:17 AM  Meds ordered this encounter  Medications  . meclizine (ANTIVERT) 12.5 MG tablet    Sig: Take 1 tablet (12.5 mg total) by mouth 3 (three) times daily as needed for dizziness.    Dispense:  30 tablet    Refill:  0    Order Specific Question:   Supervising Provider    Answer:   Janora Norlander [[id number]]    Particia Nearing PA-C [facility] [phone]

## 2019-08-08 ENCOUNTER — Telehealth: Payer: Self-pay | Admitting: Physician Assistant

## 2019-08-08 NOTE — Chronic Care Management (AMB) (Signed)
°  Chronic Care Management   Outreach Note  08/08/2019 Name: Rita Wells MRN: [id number] DOB: 1933-07-06  Referred by: Terald Sleeper, PA-C Reason for referral : Chronic Care Management (Initial CCM outreach was unsuccessful.)   An unsuccessful telephone outreach was attempted today. The patient was referred to the case management team by for assistance with chronic care management and care coordination.   Follow Up Plan: The care management team will reach out to the patient again over the next 7 days.   [place]  [email or web address]   ??[phone]

## 2019-08-14 NOTE — Chronic Care Management (AMB) (Signed)
°  Chronic Care Management   Outreach Note  08/14/2019 Name: Rita Wells MRN: [id number] DOB: 1933-02-11  Referred by: Terald Sleeper, PA-C Reason for referral : Chronic Care Management (Initial CCM outreach was unsuccessful.) and Chronic Care Management (Third CCM Creta Levin was unsuccessful. )   Third unsuccessful telephone outreach was attempted today. The patient was referred to the case management team for assistance with chronic care management and care coordination. The patient's primary care provider has been notified of our unsuccessful attempts to make or maintain contact with the patient. The care management team is pleased to engage with this patient at any time in the future should he/she be interested in assistance from the care management team.   Follow Up Plan: The care management team is available to follow up with the patient after provider conversation with the patient regarding recommendation for care management engagement and subsequent re-referral to the care management team.   [place]  [email or web address]   ??[phone]

## 2019-11-06 ENCOUNTER — Other Ambulatory Visit: Payer: Self-pay

## 2019-11-06 ENCOUNTER — Ambulatory Visit (INDEPENDENT_AMBULATORY_CARE_PROVIDER_SITE_OTHER): Payer: Medicare HMO

## 2019-11-06 DIAGNOSIS — Z23 Encounter for immunization: Secondary | ICD-10-CM | POA: Diagnosis not present

## 2019-12-05 ENCOUNTER — Other Ambulatory Visit: Payer: Self-pay | Admitting: Physician Assistant

## 2019-12-05 DIAGNOSIS — I10 Essential (primary) hypertension: Secondary | ICD-10-CM

## 2020-01-02 ENCOUNTER — Other Ambulatory Visit: Payer: Self-pay

## 2020-01-03 ENCOUNTER — Encounter: Payer: Self-pay | Admitting: Physician Assistant

## 2020-01-03 ENCOUNTER — Ambulatory Visit (INDEPENDENT_AMBULATORY_CARE_PROVIDER_SITE_OTHER): Payer: Medicare HMO | Admitting: Physician Assistant

## 2020-01-03 VITALS — BP 164/98 | HR 74 | Temp 97.5°F | Ht 63.0 in | Wt 157.2 lb

## 2020-01-03 DIAGNOSIS — E039 Hypothyroidism, unspecified: Secondary | ICD-10-CM | POA: Diagnosis not present

## 2020-01-03 DIAGNOSIS — I10 Essential (primary) hypertension: Secondary | ICD-10-CM

## 2020-01-03 DIAGNOSIS — R300 Dysuria: Secondary | ICD-10-CM | POA: Diagnosis not present

## 2020-01-03 DIAGNOSIS — E7849 Other hyperlipidemia: Secondary | ICD-10-CM | POA: Diagnosis not present

## 2020-01-03 DIAGNOSIS — Z79899 Other long term (current) drug therapy: Secondary | ICD-10-CM

## 2020-01-03 DIAGNOSIS — F419 Anxiety disorder, unspecified: Secondary | ICD-10-CM | POA: Diagnosis not present

## 2020-01-03 LAB — URINALYSIS, COMPLETE
Bilirubin, UA: NEGATIVE
Glucose, UA: NEGATIVE
Ketones, UA: NEGATIVE
Leukocytes,UA: NEGATIVE
Nitrite, UA: NEGATIVE
Protein,UA: NEGATIVE
Specific Gravity, UA: 1.015 (ref 1.005–1.030)
Urobilinogen, Ur: 0.2 mg/dL (ref 0.2–1.0)
pH, UA: 7 (ref 5.0–7.5)

## 2020-01-03 LAB — MICROSCOPIC EXAMINATION
Bacteria, UA: NONE SEEN
Renal Epithel, UA: NONE SEEN /hpf

## 2020-01-03 MED ORDER — CEPHALEXIN 500 MG PO CAPS: 500.0000 mg | ORAL_CAPSULE | Freq: Two times a day (BID) | ORAL | 0 refills | Status: DC

## 2020-01-03 MED ORDER — ALPRAZOLAM 0.25 MG PO TABS: 0.2500 mg | ORAL_TABLET | Freq: Two times a day (BID) | ORAL | 5 refills | Status: DC

## 2020-01-03 NOTE — Progress Notes (Signed)
Acute Office Visit  Subjective:    Patient ID: Rita Wells, female    DOB: 09-23-1933, 86 y.o.   MRN: [id number]  Chief Complaint  Patient presents with  . Hypertension  . Medical Management of Chronic Issues    6 month     Hypertension This is a chronic problem. The current episode started more than 1 year ago. The problem is controlled. There are no associated agents to hypertension. Past treatments include beta blockers. Identifiable causes of hypertension include a thyroid problem.  Dysuria  This is a new problem. The current episode started in the past 7 days. The problem occurs every urination. The problem has been waxing and waning. The quality of the pain is described as burning. The pain is mild. There has been no fever. She is not sexually active. There is no history of pyelonephritis. Associated symptoms include frequency and urgency. Pertinent negatives include no flank pain. She has tried nothing for the symptoms.  Thyroid Problem Presents for follow-up visit. Symptoms include anxiety. Patient reports no depressed mood, fatigue or nail problem. The symptoms have been stable.   ANXIETY ASSESSMENT Cause of anxiety: GAD This patient returns for a  month recheck on narcotic use for the above named condition(s)  Current medications-alprazolam 0.25 mg 1 twice daily as needed for anxiety Other medications tried: SSRIs Medication side effects-none Any concerns-no Any change in general medical condition-no Effectiveness of current meds-good PMP AWARE website reviewed: Yes Any suspicious activity on PMP Aware: No LME daily dose: Less than 1  Contract on file 01/03/2020 Last UDS 01/03/2020  History of overdose or risk of abuse no    Past Medical History:  Diagnosis Date  . Hyperlipidemia   . Hypertension   . Toxic goiter     Past Surgical History:  Procedure Laterality Date  . CERVICAL FUSION    . TUBAL LIGATION      Family History  Problem Relation Age  of Onset  . Heart disease Mother        MI  . Heart attack Mother   . CVA Father   . Stroke Father   . Cancer Sister        OVARIAN  . Heart disease Brother        MI  . Heart attack Brother   . Cancer Brother   . Heart disease Brother        MI  . Cancer Sister   . Heart disease Sister     Social History   Socioeconomic History  . Marital status: Widowed    Spouse name: Not on file  . Number of children: 7  . Years of education: Not on file  . Highest education level: 10th grade  Occupational History  . 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 Occupation: Tobacco Farming  Tobacco Use  . Smoking status: Former Smoker    Quit date: 12/28/1978    Years since quitting: 41.0  . Smokeless tobacco: Never Used  Substance and Sexual Activity  . Alcohol use: No  . Drug use: No  . Sexual activity: Not on file  Other Topics Concern  . Not on file  Social History Narrative  . Not on file   Social Determinants of Health   Financial Resource Strain:   . Difficulty of Paying Living Expenses: Not on file  Food Insecurity:   . Worried About Charity fundraiser in the Last Year: Not on  file  . [place] in the Last Year: Not on file  Transportation Needs:   . Lack of Transportation (Medical): Not on file  . Lack of Transportation (Non-Medical): Not on file  Physical Activity:   . Days of Exercise per Week: Not on file  . Minutes of Exercise per Session: Not on file  Stress:   . Feeling of Stress : Not on file  Social Connections:   . Frequency of Communication with Friends and Family: Not on file  . Frequency of Social Gatherings with Friends and Family: Not on file  . Attends Religious Services: Not on file  . Active Member of Clubs or Organizations: Not on file  . Attends Archivist Meetings: Not on file  . Marital Status: Not on file  Intimate Partner Violence:   . Fear of Current or Ex-Partner: Not on file  . Emotionally Abused: Not  on file  . Physically Abused: Not on file  . Sexually Abused: Not on file    Outpatient Medications Prior to Visit  Medication Sig Dispense Refill  . ALPRAZolam (XANAX) 0.25 MG tablet Take 1 tablet (0.25 mg total) by mouth 2 (two) times daily. 60 tablet 5  . cetirizine (ZYRTEC ALLERGY) 10 MG tablet Take 1 tablet (10 mg total) by mouth daily. 90 tablet 3  . fluticasone (FLONASE) 50 MCG/ACT nasal spray Place 2 sprays into both nostrils daily. 48 g 3  . levothyroxine (SYNTHROID) 75 MCG tablet Take 1 tablet (75 mcg total) by mouth daily. 90 tablet 3  . meclizine (ANTIVERT) 12.5 MG tablet Take 1 tablet (12.5 mg total) by mouth 3 (three) times daily as needed for dizziness. 30 tablet 0  . metoprolol succinate (TOPROL-XL) 100 MG 24 hr tablet TAKE ONE TABLET BY MOUTH EVERY DAY. TAKEWITH OR IMMEDIATELY FOLLOWINGA MEAL. 90 tablet 0  . rosuvastatin (CRESTOR) 20 MG tablet Take 1 tablet (20 mg total) by mouth daily. 90 tablet 3   No facility-administered medications prior to visit.    Allergies  Allergen Reactions  . Azor [Amlodipine-Olmesartan]     Review of Systems  Constitutional: Negative.  Negative for fatigue.  HENT: Negative.   Eyes: Negative.   Respiratory: Negative.   Gastrointestinal: Negative.   Endocrine: Negative.   Genitourinary: Positive for difficulty urinating, dysuria, frequency and urgency. Negative for flank pain.  Musculoskeletal: Negative.   Skin: Negative.   Psychiatric/Behavioral: The patient is nervous/anxious.        Objective:    Physical Exam Constitutional:      Appearance: She is well-developed.  HENT:     Head: Normocephalic and atraumatic.  Eyes:     Conjunctiva/sclera: Conjunctivae normal.     Pupils: Pupils are equal, round, and reactive to light.  Cardiovascular:     Rate and Rhythm: Normal rate and regular rhythm.     Heart sounds: Normal heart sounds.  Pulmonary:     Effort: Pulmonary effort is normal.     Breath sounds: Normal breath  sounds.  Abdominal:     General: Bowel sounds are normal. There is no distension.     Palpations: Abdomen is soft. There is no mass.     Tenderness: There is abdominal tenderness in the suprapubic area. There is no guarding or rebound.  Skin:    General: Skin is warm and dry.     Findings: No rash.  Neurological:     Mental Status: She is alert and oriented to person, place, and time.  Deep Tendon Reflexes: Reflexes are normal and symmetric.  Psychiatric:        Behavior: Behavior normal.        Thought Content: Thought content normal.        Judgment: Judgment normal.     BP (!) 164/98   Pulse 74   Temp (!) 97.5 F (36.4 C) (Temporal)   Ht 5\' 3"  (1.6 m)   Wt 157 lb 3.2 oz (71.3 kg)   SpO2 97%   BMI 27.85 kg/m  Wt Readings from Last 3 Encounters:  01/03/20 157 lb 3.2 oz (71.3 kg)  07/03/19 160 lb (72.6 kg)  10/11/18 162 lb 9.6 oz (73.8 kg)    Health Maintenance Due  Topic Date Due  . DEXA SCAN  09/12/2000  . PNA vac Low Risk Adult (1 of 2 - PCV13) 09/12/2000    There are no preventive care reminders to display for this patient.   Lab Results  Component Value Date   TSH 3.310 07/03/2019   Lab Results  Component Value Date   WBC 7.4 07/03/2019   HGB 13.9 07/03/2019   HCT 42.7 07/03/2019   MCV 84 07/03/2019   PLT 239 07/03/2019   Lab Results  Component Value Date   NA 136 07/03/2019   K 4.4 07/03/2019   CO2 21 07/03/2019   GLUCOSE 97 07/03/2019   BUN 16 07/03/2019   CREATININE 1.49 (H) 07/03/2019   BILITOT 0.6 07/03/2019   ALKPHOS 82 07/03/2019   AST 15 07/03/2019   ALT 11 07/03/2019   PROT 6.7 07/03/2019   ALBUMIN 4.2 07/03/2019   CALCIUM 9.0 07/03/2019   Lab Results  Component Value Date   CHOL 168 07/03/2019   Lab Results  Component Value Date   HDL 47 07/03/2019   Lab Results  Component Value Date   LDLCALC 79 07/03/2019   Lab Results  Component Value Date   TRIG 210 (H) 07/03/2019   Lab Results  Component Value Date    CHOLHDL 3.6 07/03/2019   No results found for: HGBA1C     Assessment & Plan:   Problem List Items Addressed This Visit    None    Visit Diagnoses    Dysuria    -  Primary   Relevant Orders   Urine culture   urinalysis- dip and micro   Anxiety         1. Dysuria - Urine culture - urinalysis- dip and micro - cephALEXin (KEFLEX) 500 MG capsule; Take 1 capsule (500 mg total) by mouth 2 (two) times daily.  Dispense: 14 capsule; Refill: 0  2. Anxiety - ALPRAZolam (XANAX) 0.25 MG tablet; Take 1 tablet (0.25 mg total) by mouth 2 (two) times daily.  Dispense: 60 tablet; Refill: 5  3. Other hyperlipidemia - CBC with Differential/Platelet; Standing - Lipid Panel; Standing - Lipid Panel - CBC with Differential/Platelet  4. Essential hypertension - CBC with Differential/Platelet; Standing - Lipid Panel; Standing - Lipid Panel - CBC with Differential/Platelet  5. Hypothyroidism, unspecified type - TSH; Standing - TSH   No orders of the defined types were placed in this encounter.    Terald Sleeper, PA-C

## 2020-01-03 NOTE — Addendum Note (Signed)
Addended by: Thana Ates on: 01/03/2020 12:18 PM   Modules accepted: Orders

## 2020-01-04 LAB — CBC WITH DIFFERENTIAL/PLATELET
Basophils Absolute: 0.1 10*3/uL (ref 0.0–0.2)
Basos: 1 %
EOS (ABSOLUTE): 0.4 10*3/uL (ref 0.0–0.4)
Eos: 6 %
Hematocrit: 43 % (ref 34.0–46.6)
Hemoglobin: 14.8 g/dL (ref 11.1–15.9)
Immature Grans (Abs): 0 10*3/uL (ref 0.0–0.1)
Immature Granulocytes: 0 %
Lymphocytes Absolute: 1.5 10*3/uL (ref 0.7–3.1)
Lymphs: 19 %
MCH: 28.9 pg (ref 26.6–33.0)
MCHC: 34.4 g/dL (ref 31.5–35.7)
MCV: 84 fL (ref 79–97)
Monocytes Absolute: 0.6 10*3/uL (ref 0.1–0.9)
Monocytes: 8 %
Neutrophils Absolute: 5.2 10*3/uL (ref 1.4–7.0)
Neutrophils: 66 %
Platelets: 258 10*3/uL (ref 150–450)
RBC: 5.12 x10E6/uL (ref 3.77–5.28)
RDW: 14.7 % (ref 11.7–15.4)
WBC: 7.8 10*3/uL (ref 3.4–10.8)

## 2020-01-04 LAB — LIPID PANEL
Chol/HDL Ratio: 3 ratio (ref 0.0–4.4)
Cholesterol, Total: 155 mg/dL (ref 100–199)
HDL: 51 mg/dL (ref >39)
LDL Chol Calc (NIH): 76 mg/dL (ref 0–99)
Triglycerides: 166 mg/dL — ABNORMAL HIGH (ref 0–149)
VLDL Cholesterol Cal: 28 mg/dL (ref 5–40)

## 2020-01-04 LAB — URINE CULTURE

## 2020-01-04 LAB — TSH: TSH: 3.15 u[IU]/mL (ref 0.450–4.500)

## 2020-01-06 LAB — TOXASSURE SELECT 13 (MW), URINE

## 2020-01-16 ENCOUNTER — Telehealth: Payer: Self-pay | Admitting: Physician Assistant

## 2020-01-16 DIAGNOSIS — E7849 Other hyperlipidemia: Secondary | ICD-10-CM

## 2020-01-16 MED ORDER — ROSUVASTATIN CALCIUM 20 MG PO TABS: 20.0000 mg | ORAL_TABLET | Freq: Every day | ORAL | 3 refills | Status: DC

## 2020-01-16 NOTE — Telephone Encounter (Signed)
rx sent to pharmacy

## 2020-01-16 NOTE — Telephone Encounter (Signed)
What is the name of the medication? CRESTOR (rosuvastatin)  Have you contacted your pharmacy to request a refill? Yes  Which pharmacy would you like this sent to? [facility]   Patient notified that their request is being sent to the clinical staff for review and that they should receive a call once it is complete. If they do not receive a call within 24 hours they can check with their pharmacy or our office.

## 2020-03-01 ENCOUNTER — Other Ambulatory Visit: Payer: Self-pay | Admitting: Physician Assistant

## 2020-03-01 DIAGNOSIS — I10 Essential (primary) hypertension: Secondary | ICD-10-CM

## 2020-03-07 ENCOUNTER — Telehealth: Payer: Self-pay | Admitting: Physician Assistant

## 2020-03-07 NOTE — Chronic Care Management (AMB) (Signed)
  Chronic Care Management   Note  03/07/2020 Name: Rita Wells MRN: [id number] DOB: [birth year]  Rita Wells is a 86 y.o. year old female who is a primary care patient of Terald Sleeper, PA-C. I reached out to [employer] by phone today in response to a referral sent by Rita Wells's health plan.     Ms. Saltz was given information about Chronic Care Management services today including:  1. CCM service includes personalized support from designated clinical staff supervised by her physician, including individualized plan of care and coordination with other care providers 2. 24/7 contact phone numbers for assistance for urgent and routine care needs. 3. Service will only be billed when office clinical staff spend 20 minutes or more in a month to coordinate care. 4. Only one practitioner may furnish and bill the service in a calendar month. 5. The patient may stop CCM services at any time (effective at the end of the month) by phone call to the office staff. 6. The patient will be responsible for cost sharing (co-pay) of up to 20% of the service fee (after annual deductible is met).  Patient agreed to services and verbal consent obtained.   Follow up plan: Telephone appointment with care management team member scheduled for: 06/06/2020  [facility]  [place], [place] [address] Direct Dial: [email or web address]: [email or web address]

## 2020-04-29 ENCOUNTER — Encounter: Payer: Self-pay | Admitting: *Deleted

## 2020-04-29 NOTE — Progress Notes (Signed)
Patient walked in with skin tear to right forearm. States received from a heavy grocery bag. Cleaned and covered with steri strips and guaze. Patient is due for a tetanus vaccine however when benefits were run she declined to have it today.

## 2020-06-05 ENCOUNTER — Telehealth: Payer: Self-pay | Admitting: Physician Assistant

## 2020-06-05 DIAGNOSIS — I10 Essential (primary) hypertension: Secondary | ICD-10-CM

## 2020-06-05 MED ORDER — METOPROLOL SUCCINATE ER 100 MG PO TB24: ORAL_TABLET | ORAL | 0 refills | Status: DC

## 2020-06-05 NOTE — Telephone Encounter (Signed)
  Prescription Request  06/05/2020  What is the name of the medication or equipment? BP Medication does not know name of it  Have you contacted your pharmacy to request a refill? (if applicable) yes  Which pharmacy would you like this sent to? [facility] pt has appt with Alyse Low on 07/02/20 she has about two pills left   Patient notified that their request is being sent to the clinical staff for review and that they should receive a response within 2 business days.

## 2020-06-05 NOTE — Telephone Encounter (Signed)
BP med sent in

## 2020-06-06 ENCOUNTER — Ambulatory Visit: Payer: Medicare HMO | Admitting: *Deleted

## 2020-06-06 DIAGNOSIS — E039 Hypothyroidism, unspecified: Secondary | ICD-10-CM

## 2020-06-06 DIAGNOSIS — E7849 Other hyperlipidemia: Secondary | ICD-10-CM

## 2020-06-06 DIAGNOSIS — I10 Essential (primary) hypertension: Secondary | ICD-10-CM

## 2020-06-06 NOTE — Chronic Care Management (AMB) (Addendum)
  Chronic Care Management   Outreach for Initial Visit  06/06/2020 Name: Rita Wells MRN: [id number] DOB: 07/13/1933  Referred by: Sharion Balloon, FNP Reason for referral : Chronic Care Management (Initial Visit)   An unsuccessful Initial Telephone visit was attempted today. The patient was referred to the case management team for assistance with care management and care coordination.   Goals Addressed             This Visit's Progress    Chronic Disease Management Needs       CARE PLAN ENTRY (see longtitudinal plan of care for additional care plan information)  Current Barriers:  Chronic Disease Management support, education, and care coordination needs related to HTN, hypothyroidism, HLD, GAD  Clinical Goals: Over the next 10 days, patient will be contacted by a Care Guide to reschedule their Initial CCM Visit Over the next 30 days, patient will have an Initial CCM Visit with a member of the embedded CCM team to discuss self-management of their chronic medical conditions  Interventions: Chart reviewed in preparation for initial visit telephone call Collaboration with other care team members as needed Unsuccessful outreach to patient  A HIPPA compliant phone message was left for the patient providing contact information and requesting a return call.  Request sent to care guides to reach out and reschedule patient's initial visit  Patient Self Care Activities: Undetermined   Initial goal documentation          Follow Up Plan: A HIPPA compliant phone message was left for the patient providing contact information and requesting a return call.  The care management team will reach out to the patient again over the next 10 days.   Chong Sicilian, BSN, RN-BC Embedded Chronic Care Manager [facility] [place] Direct Dial: [phone]   I have reviewed the CCM documentation and agree with the written assessment and plan of  care.  Evelina Dun, FNP

## 2020-06-07 ENCOUNTER — Telehealth: Payer: Self-pay | Admitting: Family

## 2020-06-07 NOTE — Chronic Care Management (AMB) (Signed)
  Chronic Care Management   Note  06/07/2020 Name: Rita Wells MRN: [id number] DOB: 1933/08/07  Rita Wells is a 86 y.o. year old female who is a primary care patient of Sharion Balloon, FNP and is actively engaged with the care management team. I reached out to [employer] by phone today to assist with re-scheduling an initial visit with the RN Case Manager.  Follow up plan: Telephone appointment with care management team member scheduled for: 07/25/2020  [facility]  [address], [place] [address] Direct Dial: [email or web address] Website: [email or web address]

## 2020-07-02 ENCOUNTER — Ambulatory Visit: Payer: Medicare HMO | Admitting: Physician Assistant

## 2020-07-02 ENCOUNTER — Other Ambulatory Visit: Payer: Self-pay

## 2020-07-02 ENCOUNTER — Ambulatory Visit (INDEPENDENT_AMBULATORY_CARE_PROVIDER_SITE_OTHER): Payer: Medicare HMO | Admitting: Family

## 2020-07-02 ENCOUNTER — Encounter: Payer: Self-pay | Admitting: Family

## 2020-07-02 VITALS — BP 154/81 | HR 60 | Temp 97.7°F | Ht 63.0 in | Wt 157.4 lb

## 2020-07-02 DIAGNOSIS — F411 Generalized anxiety disorder: Secondary | ICD-10-CM | POA: Diagnosis not present

## 2020-07-02 DIAGNOSIS — E039 Hypothyroidism, unspecified: Secondary | ICD-10-CM

## 2020-07-02 DIAGNOSIS — F1320 Sedative, hypnotic or anxiolytic dependence, uncomplicated: Secondary | ICD-10-CM

## 2020-07-02 DIAGNOSIS — E7849 Other hyperlipidemia: Secondary | ICD-10-CM

## 2020-07-02 DIAGNOSIS — I10 Essential (primary) hypertension: Secondary | ICD-10-CM

## 2020-07-02 DIAGNOSIS — F419 Anxiety disorder, unspecified: Secondary | ICD-10-CM | POA: Diagnosis not present

## 2020-07-02 DIAGNOSIS — J449 Chronic obstructive pulmonary disease, unspecified: Secondary | ICD-10-CM

## 2020-07-02 DIAGNOSIS — Z79899 Other long term (current) drug therapy: Secondary | ICD-10-CM

## 2020-07-02 DIAGNOSIS — G4700 Insomnia, unspecified: Secondary | ICD-10-CM | POA: Diagnosis not present

## 2020-07-02 MED ORDER — ROSUVASTATIN CALCIUM 20 MG PO TABS: 20.0000 mg | ORAL_TABLET | Freq: Every day | ORAL | 3 refills | Status: DC

## 2020-07-02 MED ORDER — ALPRAZOLAM 0.25 MG PO TABS: 0.2500 mg | ORAL_TABLET | Freq: Every evening | ORAL | 2 refills | Status: DC | PRN

## 2020-07-02 MED ORDER — TRAZODONE HCL 50 MG PO TABS: 50.0000 mg | ORAL_TABLET | Freq: Every evening | ORAL | 3 refills | Status: DC | PRN

## 2020-07-02 MED ORDER — LEVOTHYROXINE SODIUM 75 MCG PO TABS: 75.0000 ug | ORAL_TABLET | Freq: Every day | ORAL | 3 refills | Status: DC

## 2020-07-02 MED ORDER — METOPROLOL SUCCINATE ER 100 MG PO TB24: ORAL_TABLET | ORAL | 0 refills | Status: DC

## 2020-07-02 NOTE — Patient Instructions (Signed)

## 2020-07-02 NOTE — Progress Notes (Signed)
Subjective:    Patient ID: Rita Wells, female    DOB: 1933/08/03, 86 y.o.   MRN: [id number]  Chief Complaint  Patient presents with  . Hypertension    Ronnald Ramp [patient 6 months   PT presents to the office today to establish care with me.  Hypertension This is a chronic problem. The current episode started more than 1 year ago. The problem has been waxing and waning since onset. The problem is uncontrolled. Associated symptoms include anxiety and malaise/fatigue. Pertinent negatives include no peripheral edema or shortness of breath. Risk factors for coronary artery disease include sedentary lifestyle and obesity. The current treatment provides moderate improvement. There is no history of CVA or heart failure. Identifiable causes of hypertension include a thyroid problem.  Thyroid Problem Presents for follow-up visit. Symptoms include anxiety, fatigue and hoarse voice. Patient reports no constipation or depressed mood. The symptoms have been stable. Her past medical history is significant for hyperlipidemia. There is no history of heart failure.  Hyperlipidemia This is a chronic problem. The current episode started more than 1 year ago. The problem is controlled. Recent lipid tests were reviewed and are normal. Exacerbating diseases include hypothyroidism. Pertinent negatives include no shortness of breath. Current antihyperlipidemic treatment includes statins. The current treatment provides moderate improvement of lipids. Risk factors for coronary artery disease include hypertension, a sedentary lifestyle and post-menopausal.  Insomnia Primary symptoms: difficulty falling asleep, frequent awakening, malaise/fatigue.  The current episode started more than one year. The onset quality is gradual. The problem occurs intermittently.  Anxiety Presents for follow-up visit. Symptoms include excessive worry, insomnia, irritability and nervous/anxious behavior. Patient reports no depressed mood or  shortness of breath. Symptoms occur most days. The severity of symptoms is mild.        Review of Systems  Constitutional: Positive for fatigue, irritability and malaise/fatigue.  HENT: Positive for hoarse voice.   Respiratory: Negative for shortness of breath.   Gastrointestinal: Negative for constipation.  Psychiatric/Behavioral: The patient is nervous/anxious and has insomnia.   All other systems reviewed and are negative.      Objective:   Physical Exam Vitals reviewed.  Constitutional:      General: She is not in acute distress.    Appearance: She is well-developed.  HENT:     Head: Normocephalic and atraumatic.     Comments: Hoarse voice    Right Ear: Tympanic membrane normal.     Left Ear: Tympanic membrane normal.  Eyes:     Pupils: Pupils are equal, round, and reactive to light.  Neck:     Thyroid: No thyromegaly.  Cardiovascular:     Rate and Rhythm: Normal rate and regular rhythm.     Heart sounds: Normal heart sounds. No murmur heard.   Pulmonary:     Effort: Pulmonary effort is normal. No respiratory distress.     Breath sounds: Rhonchi present. No wheezing.  Abdominal:     General: Bowel sounds are normal. There is no distension.     Palpations: Abdomen is soft.     Tenderness: There is no abdominal tenderness.  Musculoskeletal:        General: No tenderness. Normal range of motion.     Cervical back: Normal range of motion and neck supple.  Skin:    General: Skin is warm and dry.  Neurological:     Mental Status: She is alert and oriented to person, place, and time.     Cranial Nerves: No cranial nerve deficit.  Deep Tendon Reflexes: Reflexes are normal and symmetric.  Psychiatric:        Behavior: Behavior normal.        Thought Content: Thought content normal.        Judgment: Judgment normal.       BP (!) 154/81   Pulse 60   Temp 97.7 F (36.5 C) (Temporal)   Ht _0  (1.6 m)   Wt 157 lb 6.4 oz (71.4 kg)   BMI 27.88 kg/m        Assessment & Plan:  Rita Wells comes in today with chief complaint of Hypertension Ronnald Ramp [patient 6 months)   Diagnosis and orders addressed:  1. Anxiety -Long discussion about weaning xanax. We decreased to 30 tabs this month from 60. We have also started trazodone to take instead of xanax. Goal is to stop xanax.  - ALPRAZolam (XANAX) 0.25 MG tablet; Take 1 tablet (0.25 mg total) by mouth at bedtime as needed for anxiety.  Dispense: 30 tablet; Refill: 2 - CMP14+EGFR - CBC with Differential/Platelet  2. Essential hypertension - metoprolol succinate (TOPROL-XL) 100 MG 24 hr tablet; Take with or immediately following a meal.  Dispense: 90 tablet; Refill: 0 - CMP14+EGFR - CBC with Differential/Platelet  3. Hypothyroidism, unspecified type - levothyroxine (SYNTHROID) 75 MCG tablet; Take 1 tablet (75 mcg total) by mouth daily.  Dispense: 90 tablet; Refill: 3 - CMP14+EGFR - CBC with Differential/Platelet - TSH  4. Other hyperlipidemia - rosuvastatin (CRESTOR) 20 MG tablet; Take 1 tablet (20 mg total) by mouth daily.  Dispense: 90 tablet; Refill: 3 - CMP14+EGFR - CBC with Differential/Platelet  5. Generalized anxiety disorder - CMP14+EGFR - CBC with Differential/Platelet  6. Insomnia, unspecified type - CMP14+EGFR - CBC with Differential/Platelet - traZODone (DESYREL) 50 MG tablet; Take 1-2 tablets (50-100 mg total) by mouth at bedtime as needed for sleep.  Dispense: 60 tablet; Refill: 3 - ToxASSURE Select 13 (MW), Urine  7. Benzodiazepine dependence (HCC) - ALPRAZolam (XANAX) 0.25 MG tablet; Take 1 tablet (0.25 mg total) by mouth at bedtime as needed for anxiety.  Dispense: 30 tablet; Refill: 2 - CMP14+EGFR - CBC with Differential/Platelet - ToxASSURE Select 13 (MW), Urine  8. Controlled substance agreement signed - ALPRAZolam (XANAX) 0.25 MG tablet; Take 1 tablet (0.25 mg total) by mouth at bedtime as needed for anxiety.  Dispense: 30 tablet; Refill: 2 -  CMP14+EGFR - CBC with Differential/Platelet - ToxASSURE Select 13 (MW), Urine  9. Chronic obstructive pulmonary disease, unspecified COPD type ([place]) - CMP14+EGFR - CBC with Differential/Platelet   Labs pending Health Maintenance reviewed Diet and exercise encouraged  Follow up plan: 3 months    Evelina Dun, FNP

## 2020-07-03 LAB — CBC WITH DIFFERENTIAL/PLATELET
Basophils Absolute: 0 10*3/uL (ref 0.0–0.2)
Basos: 1 %
EOS (ABSOLUTE): 0.5 10*3/uL — ABNORMAL HIGH (ref 0.0–0.4)
Eos: 6 %
Hematocrit: 44 % (ref 34.0–46.6)
Hemoglobin: 14.7 g/dL (ref 11.1–15.9)
Immature Grans (Abs): 0 10*3/uL (ref 0.0–0.1)
Immature Granulocytes: 0 %
Lymphocytes Absolute: 1.9 10*3/uL (ref 0.7–3.1)
Lymphs: 22 %
MCH: 28.6 pg (ref 26.6–33.0)
MCHC: 33.4 g/dL (ref 31.5–35.7)
MCV: 86 fL (ref 79–97)
Monocytes Absolute: 0.7 10*3/uL (ref 0.1–0.9)
Monocytes: 9 %
Neutrophils Absolute: 5.4 10*3/uL (ref 1.4–7.0)
Neutrophils: 62 %
Platelets: 220 10*3/uL (ref 150–450)
RBC: 5.14 x10E6/uL (ref 3.77–5.28)
RDW: 14.7 % (ref 11.7–15.4)
WBC: 8.6 10*3/uL (ref 3.4–10.8)

## 2020-07-03 LAB — CMP14+EGFR
ALT: 12 IU/L (ref 0–32)
AST: 23 IU/L (ref 0–40)
Albumin/Globulin Ratio: 1.8 (ref 1.2–2.2)
Albumin: 4.2 g/dL (ref 3.6–4.6)
Alkaline Phosphatase: 102 IU/L (ref 48–121)
BUN/Creatinine Ratio: 13 (ref 12–28)
BUN: 20 mg/dL (ref 8–27)
Bilirubin Total: 0.6 mg/dL (ref 0.0–1.2)
CO2: 21 mmol/L (ref 20–29)
Calcium: 10.1 mg/dL (ref 8.7–10.3)
Chloride: 98 mmol/L (ref 96–106)
Creatinine, Ser: 1.49 mg/dL — ABNORMAL HIGH (ref 0.57–1.00)
GFR calc Af Amer: 37 mL/min/{1.73_m2} — ABNORMAL LOW (ref >59)
GFR calc non Af Amer: 32 mL/min/{1.73_m2} — ABNORMAL LOW (ref >59)
Globulin, Total: 2.4 g/dL (ref 1.5–4.5)
Glucose: 90 mg/dL (ref 65–99)
Potassium: 4.9 mmol/L (ref 3.5–5.2)
Sodium: 138 mmol/L (ref 134–144)
Total Protein: 6.6 g/dL (ref 6.0–8.5)

## 2020-07-03 LAB — TSH: TSH: 3.08 u[IU]/mL (ref 0.450–4.500)

## 2020-07-04 LAB — TOXASSURE SELECT 13 (MW), URINE

## 2020-07-25 ENCOUNTER — Telehealth: Payer: Medicare HMO | Admitting: *Deleted

## 2020-09-10 ENCOUNTER — Telehealth: Payer: Self-pay | Admitting: Family

## 2020-09-10 NOTE — Telephone Encounter (Signed)
It looks like patient is already on Xanax and to try to switch to something else, she can always try melatonin over-the-counter while she is waiting for try the trazodone for a little bit longer.,  Sometimes it takes a while to get into her system.  She will have to discuss anything else further with [facility] when she returns.

## 2020-09-10 NOTE — Telephone Encounter (Signed)
Please advise if something else can be sent in. Covering PCP- please advise

## 2020-09-24 DIAGNOSIS — K5900 Constipation, unspecified: Secondary | ICD-10-CM | POA: Diagnosis not present

## 2020-09-24 DIAGNOSIS — I491 Atrial premature depolarization: Secondary | ICD-10-CM | POA: Diagnosis not present

## 2020-09-24 DIAGNOSIS — R5381 Other malaise: Secondary | ICD-10-CM | POA: Diagnosis not present

## 2020-09-25 ENCOUNTER — Ambulatory Visit (INDEPENDENT_AMBULATORY_CARE_PROVIDER_SITE_OTHER): Payer: Medicare HMO | Admitting: Family Medicine

## 2020-09-25 ENCOUNTER — Ambulatory Visit: Payer: Medicare HMO | Admitting: Family Medicine

## 2020-09-25 ENCOUNTER — Other Ambulatory Visit: Payer: Self-pay

## 2020-09-25 ENCOUNTER — Encounter: Payer: Self-pay | Admitting: Family Medicine

## 2020-09-25 DIAGNOSIS — R399 Unspecified symptoms and signs involving the genitourinary system: Secondary | ICD-10-CM

## 2020-09-25 LAB — URINALYSIS, COMPLETE
Bilirubin, UA: NEGATIVE
Glucose, UA: NEGATIVE
Ketones, UA: NEGATIVE
Leukocytes,UA: NEGATIVE
Nitrite, UA: NEGATIVE
Protein,UA: NEGATIVE
Specific Gravity, UA: 1.02 (ref 1.005–1.030)
Urobilinogen, Ur: 0.2 mg/dL (ref 0.2–1.0)
pH, UA: 7 (ref 5.0–7.5)

## 2020-09-25 LAB — MICROSCOPIC EXAMINATION
Bacteria, UA: NONE SEEN
Epithelial Cells (non renal): NONE SEEN /hpf (ref 0–10)

## 2020-09-25 MED ORDER — CEPHALEXIN 500 MG PO CAPS: 500.0000 mg | ORAL_CAPSULE | Freq: Four times a day (QID) | ORAL | 0 refills | Status: AC

## 2020-09-25 NOTE — Progress Notes (Signed)
Virtual Visit via Telephone Note  I connected with Rita Wells on 09/25/20 at 1:30 PM by telephone and verified that I am speaking with the correct person using two identifiers. Rita Wells is currently located at home and nobody is currently with her during this visit. The provider, Loman Brooklyn, FNP is located in their office at time of visit.  I discussed the limitations, risks, security and privacy concerns of performing an evaluation and management service by telephone and the availability of in person appointments. I also discussed with the patient that there may be a patient responsible charge related to this service. The patient expressed understanding and agreed to proceed.  Subjective: PCP: Sharion Balloon, FNP  Chief Complaint  Patient presents with  . Urinary Tract Infection   Urinary Tract Infection: Patient complains of dysuria, hesitancy and suprapubic pressure. She has had symptoms for 2 weeks. Patient denies back pain and fever. Patient does have a history of recurrent UTI.  Patient does not have a history of pyelonephritis.    ROS: Per HPI  Current Outpatient Medications:  .  ALPRAZolam (XANAX) 0.25 MG tablet, Take 1 tablet (0.25 mg total) by mouth at bedtime as needed for anxiety., Disp: 30 tablet, Rfl: 2 .  cetirizine (ZYRTEC ALLERGY) 10 MG tablet, Take 1 tablet (10 mg total) by mouth daily., Disp: 90 tablet, Rfl: 3 .  fluticasone (FLONASE) 50 MCG/ACT nasal spray, Place 2 sprays into both nostrils daily., Disp: 48 g, Rfl: 3 .  levothyroxine (SYNTHROID) 75 MCG tablet, Take 1 tablet (75 mcg total) by mouth daily., Disp: 90 tablet, Rfl: 3 .  metoprolol succinate (TOPROL-XL) 100 MG 24 hr tablet, Take with or immediately following a meal., Disp: 90 tablet, Rfl: 0 .  rosuvastatin (CRESTOR) 20 MG tablet, Take 1 tablet (20 mg total) by mouth daily., Disp: 90 tablet, Rfl: 3 .  traZODone (DESYREL) 50 MG tablet, Take 1-2 tablets (50-100 mg total) by mouth at  bedtime as needed for sleep., Disp: 60 tablet, Rfl: 3  Allergies  Allergen Reactions  . Azor [Amlodipine-Olmesartan]    Past Medical History:  Diagnosis Date  . Hyperlipidemia   . Hypertension   . Toxic goiter     Observations/Objective: A&O  No respiratory distress or wheezing audible over the phone Mood, judgement, and thought processes all WNL  Assessment and Plan: 1. UTI symptoms - Education provided on UTIs. Encouraged adequate hydration.  - Urine Culture - Urinalysis, Complete - cephALEXin (KEFLEX) 500 MG capsule; Take 1 capsule (500 mg total) by mouth 4 (four) times daily for 7 days.  Dispense: 28 capsule; Refill: 0   Follow Up Instructions:  I discussed the assessment and treatment plan with the patient. The patient was provided an opportunity to ask questions and all were answered. The patient agreed with the plan and demonstrated an understanding of the instructions.   The patient was advised to call back or seek an in-person evaluation if the symptoms worsen or if the condition fails to improve as anticipated.  The above assessment and management plan was discussed with the patient. The patient verbalized understanding of and has agreed to the management plan. Patient is aware to call the clinic if symptoms persist or worsen. Patient is aware when to return to the clinic for a follow-up visit. Patient educated on when it is appropriate to go to the emergency department.   Time call ended: 1:38 PM  I provided 10 minutes of non-face-to-face time during this encounter.  Hendricks Limes, MSN, APRN, FNP-C [facility] 09/25/20

## 2020-09-27 LAB — URINE CULTURE

## 2020-10-03 ENCOUNTER — Other Ambulatory Visit: Payer: Self-pay | Admitting: Family

## 2020-10-03 ENCOUNTER — Ambulatory Visit (INDEPENDENT_AMBULATORY_CARE_PROVIDER_SITE_OTHER): Payer: Medicare HMO

## 2020-10-03 ENCOUNTER — Other Ambulatory Visit: Payer: Self-pay

## 2020-10-03 ENCOUNTER — Encounter: Payer: Self-pay | Admitting: Family

## 2020-10-03 ENCOUNTER — Ambulatory Visit (INDEPENDENT_AMBULATORY_CARE_PROVIDER_SITE_OTHER): Payer: Medicare HMO | Admitting: Family

## 2020-10-03 VITALS — BP 139/85 | HR 66 | Temp 97.7°F | Ht 63.0 in | Wt 153.0 lb

## 2020-10-03 DIAGNOSIS — M5134 Other intervertebral disc degeneration, thoracic region: Secondary | ICD-10-CM

## 2020-10-03 DIAGNOSIS — I10 Essential (primary) hypertension: Secondary | ICD-10-CM | POA: Diagnosis not present

## 2020-10-03 DIAGNOSIS — Z780 Asymptomatic menopausal state: Secondary | ICD-10-CM | POA: Diagnosis not present

## 2020-10-03 DIAGNOSIS — F411 Generalized anxiety disorder: Secondary | ICD-10-CM | POA: Diagnosis not present

## 2020-10-03 DIAGNOSIS — M5030 Other cervical disc degeneration, unspecified cervical region: Secondary | ICD-10-CM

## 2020-10-03 DIAGNOSIS — M85832 Other specified disorders of bone density and structure, left forearm: Secondary | ICD-10-CM | POA: Diagnosis not present

## 2020-10-03 DIAGNOSIS — E7849 Other hyperlipidemia: Secondary | ICD-10-CM

## 2020-10-03 DIAGNOSIS — G4700 Insomnia, unspecified: Secondary | ICD-10-CM

## 2020-10-03 DIAGNOSIS — E039 Hypothyroidism, unspecified: Secondary | ICD-10-CM | POA: Diagnosis not present

## 2020-10-03 DIAGNOSIS — M8580 Other specified disorders of bone density and structure, unspecified site: Secondary | ICD-10-CM

## 2020-10-03 DIAGNOSIS — J449 Chronic obstructive pulmonary disease, unspecified: Secondary | ICD-10-CM

## 2020-10-03 NOTE — Patient Instructions (Signed)
Health Maintenance After Age 84 After age 84, you are at a higher risk for certain long-term diseases and infections as well as injuries from falls. Falls are a major cause of broken bones and head injuries in people who are older than age 84. Getting regular preventive care can help to keep you healthy and well. Preventive care includes getting regular testing and making lifestyle changes as recommended by your health care provider. Talk with your health care provider about:  Which screenings and tests you should have. A screening is a test that checks for a disease when you have no symptoms.  A diet and exercise plan that is right for you. What should I know about screenings and tests to prevent falls? Screening and testing are the best ways to find a health problem early. Early diagnosis and treatment give you the best chance of managing medical conditions that are common after age 84. Certain conditions and lifestyle choices may make you more likely to have a fall. Your health care provider may recommend:  Regular vision checks. Poor vision and conditions such as cataracts can make you more likely to have a fall. If you wear glasses, make sure to get your prescription updated if your vision changes.  Medicine review. Work with your health care provider to regularly review all of the medicines you are taking, including over-the-counter medicines. Ask your health care provider about any side effects that may make you more likely to have a fall. Tell your health care provider if any medicines that you take make you feel dizzy or sleepy.  Osteoporosis screening. Osteoporosis is a condition that causes the bones to get weaker. This can make the bones weak and cause them to break more easily.  Blood pressure screening. Blood pressure changes and medicines to control blood pressure can make you feel dizzy.  Strength and balance checks. Your health care provider may recommend certain tests to check your  strength and balance while standing, walking, or changing positions.  Foot health exam. Foot pain and numbness, as well as not wearing proper footwear, can make you more likely to have a fall.  Depression screening. You may be more likely to have a fall if you have a fear of falling, feel emotionally low, or feel unable to do activities that you used to do.  Alcohol use screening. Using too much alcohol can affect your balance and may make you more likely to have a fall. What actions can I take to lower my risk of falls? General instructions  Talk with your health care provider about your risks for falling. Tell your health care provider if: ? You fall. Be sure to tell your health care provider about all falls, even ones that seem minor. ? You feel dizzy, sleepy, or off-balance.  Take over-the-counter and prescription medicines only as told by your health care provider. These include any supplements.  Eat a healthy diet and maintain a healthy weight. A healthy diet includes low-fat dairy products, low-fat (lean) meats, and fiber from whole grains, beans, and lots of fruits and vegetables. Home safety  Remove any tripping hazards, such as rugs, cords, and clutter.  Install safety equipment such as grab bars in bathrooms and safety rails on stairs.  Keep rooms and walkways well-lit. Activity   Follow a regular exercise program to stay fit. This will help you maintain your balance. Ask your health care provider what types of exercise are appropriate for you.  If you need a cane or   walker, use it as recommended by your health care provider.  Wear supportive shoes that have nonskid soles. Lifestyle  Do not drink alcohol if your health care provider tells you not to drink.  If you drink alcohol, limit how much you have: ? 0-1 drink a day for women. ? 0-2 drinks a day for men.  Be aware of how much alcohol is in your drink. In the [place] one drink equals one typical bottle of beer (12  oz), one-half glass of wine (5 oz), or one shot of hard liquor (1 oz).  Do not use any products that contain nicotine or tobacco, such as cigarettes and e-cigarettes. If you need help quitting, ask your health care provider. Summary  Having a healthy lifestyle and getting preventive care can help to protect your health and wellness after age 84.  Screening and testing are the best way to find a health problem early and help you avoid having a fall. Early diagnosis and treatment give you the best chance for managing medical conditions that are more common for people who are older than age 84.  Falls are a major cause of broken bones and head injuries in people who are older than age 84. Take precautions to prevent a fall at home.  Work with your health care provider to learn what changes you can make to improve your health and wellness and to prevent falls. This information is not intended to replace advice given to you by your health care provider. Make sure you discuss any questions you have with your health care provider. Document Revised: 04/06/2019 Document Reviewed: 10/27/2017 Elsevier Patient Education  2020 Elsevier Inc.  

## 2020-10-03 NOTE — Progress Notes (Signed)
Subjective:    Patient ID: Rita Wells, female    DOB: 06-05-1933, 86 y.o.   MRN: [id number]  Chief Complaint  Patient presents with  . Medical Management of Chronic Issues    trazodone caused hullicinations   . Hypertension  . Anxiety   Pt presents to the office today for chronic follow up.  Hypertension This is a chronic problem. The current episode started more than 1 year ago. The problem has been resolved since onset. Associated symptoms include anxiety. Pertinent negatives include no malaise/fatigue, peripheral edema or shortness of breath. Risk factors for coronary artery disease include dyslipidemia and sedentary lifestyle. The current treatment provides moderate improvement. There is no history of CVA or heart failure. Identifiable causes of hypertension include a thyroid problem.  Anxiety Presents for follow-up visit. Symptoms include depressed mood, insomnia, irritability, nervous/anxious behavior and restlessness. Patient reports no shortness of breath. Symptoms occur occasionally. The quality of sleep is good.    Thyroid Problem Presents for follow-up visit. Symptoms include anxiety, depressed mood and hoarse voice. Patient reports no fatigue. The symptoms have been stable. There is no history of heart failure.  Insomnia Primary symptoms: difficulty falling asleep, frequent awakening, no malaise/fatigue.  The current episode started more than one year. The onset quality is gradual. The problem occurs intermittently. The treatment provided moderate relief.   COPD Quit smoking in 80's. Hoarse voice, but breathing is stable.   Review of Systems  Constitutional: Positive for irritability. Negative for fatigue and malaise/fatigue.  HENT: Positive for hoarse voice.   Respiratory: Negative for shortness of breath.   Psychiatric/Behavioral: The patient is nervous/anxious and has insomnia.   All other systems reviewed and are negative.      Objective:   Physical  Exam Vitals reviewed.  Constitutional:      General: She is not in acute distress.    Appearance: She is well-developed.  HENT:     Head: Normocephalic and atraumatic.     Right Ear: Tympanic membrane normal.     Left Ear: Tympanic membrane normal.  Eyes:     Pupils: Pupils are equal, round, and reactive to light.  Neck:     Thyroid: No thyromegaly.  Cardiovascular:     Rate and Rhythm: Normal rate and regular rhythm.     Heart sounds: Normal heart sounds. No murmur heard.   Pulmonary:     Effort: Pulmonary effort is normal. No respiratory distress.     Breath sounds: Decreased breath sounds and rhonchi present. No wheezing.  Abdominal:     General: Bowel sounds are normal. There is no distension.     Palpations: Abdomen is soft.     Tenderness: There is no abdominal tenderness.  Musculoskeletal:        General: No tenderness. Normal range of motion.     Cervical back: Normal range of motion and neck supple.  Skin:    General: Skin is warm and dry.  Neurological:     Mental Status: She is alert and oriented to person, place, and time.     Cranial Nerves: No cranial nerve deficit.     Deep Tendon Reflexes: Reflexes are normal and symmetric.  Psychiatric:        Behavior: Behavior normal.        Thought Content: Thought content normal.        Judgment: Judgment normal.          BP 139/85   Pulse 66   Temp 97.7 F (  36.5 C) (Temporal)   Ht 5\' 3"  (1.6 m)   Wt 153 lb (69.4 kg)   BMI 27.10 kg/m   Assessment & Plan:  Rita Wells comes in today with chief complaint of Medical Management of Chronic Issues (trazodone caused hullicinations ), Hypertension, and Anxiety   Diagnosis and orders addressed:  1. Primary hypertension  2. Chronic obstructive pulmonary disease, unspecified COPD type ([place])  3. Hypothyroidism, unspecified type  4. DDD (degenerative disc disease), cervical  5. DDD (degenerative disc disease), thoracic  6. Generalized anxiety  disorder  7. Insomnia, unspecified type  8. Other hyperlipidemia  9. Post-menopausal - DG [facility] DEXA   Labs pending Health Maintenance reviewed Diet and exercise encouraged  Follow up plan: 6 months    Rita Dun, FNP

## 2020-10-17 ENCOUNTER — Other Ambulatory Visit: Payer: Self-pay

## 2020-10-17 ENCOUNTER — Ambulatory Visit (INDEPENDENT_AMBULATORY_CARE_PROVIDER_SITE_OTHER): Payer: Medicare HMO | Admitting: *Deleted

## 2020-10-17 DIAGNOSIS — Z23 Encounter for immunization: Secondary | ICD-10-CM

## 2020-10-28 DIAGNOSIS — R109 Unspecified abdominal pain: Secondary | ICD-10-CM | POA: Diagnosis not present

## 2020-10-28 DIAGNOSIS — R1031 Right lower quadrant pain: Secondary | ICD-10-CM | POA: Diagnosis not present

## 2020-10-31 ENCOUNTER — Encounter: Payer: Self-pay | Admitting: Family

## 2020-10-31 ENCOUNTER — Ambulatory Visit (INDEPENDENT_AMBULATORY_CARE_PROVIDER_SITE_OTHER): Payer: Medicare HMO

## 2020-10-31 ENCOUNTER — Other Ambulatory Visit: Payer: Self-pay

## 2020-10-31 ENCOUNTER — Ambulatory Visit (INDEPENDENT_AMBULATORY_CARE_PROVIDER_SITE_OTHER): Payer: Medicare HMO | Admitting: Family

## 2020-10-31 VITALS — BP 167/92 | HR 69 | Temp 97.2°F | Ht 63.0 in | Wt 148.4 lb

## 2020-10-31 DIAGNOSIS — R1084 Generalized abdominal pain: Secondary | ICD-10-CM | POA: Diagnosis not present

## 2020-10-31 DIAGNOSIS — K5900 Constipation, unspecified: Secondary | ICD-10-CM

## 2020-10-31 MED ORDER — LACTULOSE 20 GM/30ML PO SOLN: 30.0000 mL | Freq: Two times a day (BID) | ORAL | 2 refills | Status: DC

## 2020-10-31 NOTE — Patient Instructions (Signed)

## 2020-10-31 NOTE — Progress Notes (Signed)
   Subjective:    Patient ID: Rita Wells, female    DOB: 1933-05-25, 86 y.o.   MRN: [id number]  Chief Complaint  Patient presents with  . Constipation   PT presents to the office today with recurrent abdominal pain. She went to the Urgent Care and diagnosed with constipation. She was given Miralax and Colace. Pt states she has only had one BM since last Friday.  Constipation This is a chronic problem. The current episode started more than 1 year ago. The problem has been waxing and waning since onset. She has tried stool softeners and laxatives for the symptoms. The treatment provided no relief.      Review of Systems  Gastrointestinal: Positive for constipation.  All other systems reviewed and are negative.      Objective:   Physical Exam Vitals reviewed.  Constitutional:      General: She is not in acute distress.    Appearance: She is well-developed.  HENT:     Head: Normocephalic and atraumatic.  Eyes:     Pupils: Pupils are equal, round, and reactive to light.  Neck:     Thyroid: No thyromegaly.  Cardiovascular:     Rate and Rhythm: Normal rate and regular rhythm.     Heart sounds: Normal heart sounds. No murmur heard.   Pulmonary:     Effort: Pulmonary effort is normal. No respiratory distress.     Breath sounds: Normal breath sounds. No wheezing.  Abdominal:     General: Bowel sounds are normal. There is distension.     Palpations: Abdomen is soft.     Tenderness: There is abdominal tenderness (mild).  Musculoskeletal:        General: No tenderness. Normal range of motion.     Cervical back: Normal range of motion and neck supple.  Skin:    General: Skin is warm and dry.  Neurological:     Mental Status: She is alert and oriented to person, place, and time.     Cranial Nerves: No cranial nerve deficit.     Deep Tendon Reflexes: Reflexes are normal and symmetric.  Psychiatric:        Behavior: Behavior normal.        Thought Content: Thought content  normal.        Judgment: Judgment normal.     KUB- Large stool burden, Preliminary reading by Evelina Dun, FNP [facility]   BP (!) 167/92   Pulse 69   Temp (!) 97.2 F (36.2 C) (Temporal)   Ht _0  (1.6 m)   Wt 148 lb 6.4 oz (67.3 kg)   SpO2 97%   BMI 26.29 kg/m      Assessment & Plan:  Rita Wells comes in today with chief complaint of Constipation   Diagnosis and orders addressed:  1. Constipation, unspecified constipation type - DG Abd 1 View - CBC with Differential/Platelet - BMP8+EGFR - Lactulose 20 GM/30ML SOLN; Take 30 mLs (20 g total) by mouth in the morning and at bedtime.  Dispense: 450 mL; Refill: 2  2. Generalized abdominal pain - CBC with Differential/Platelet - BMP8+EGFR - Lactulose 20 GM/30ML SOLN; Take 30 mLs (20 g total) by mouth in the morning and at bedtime.  Dispense: 450 mL; Refill: 2   Start Lactulose BID Want her to do an enema and Mag citrate If blockage present, will call and send to ED Stay hydrated  Evelina Dun, FNP

## 2020-11-01 LAB — BMP8+EGFR
BUN/Creatinine Ratio: 19 (ref 12–28)
BUN: 30 mg/dL — ABNORMAL HIGH (ref 8–27)
CO2: 25 mmol/L (ref 20–29)
Calcium: 10 mg/dL (ref 8.7–10.3)
Chloride: 91 mmol/L — ABNORMAL LOW (ref 96–106)
Creatinine, Ser: 1.54 mg/dL — ABNORMAL HIGH (ref 0.57–1.00)
GFR calc Af Amer: 35 mL/min/{1.73_m2} — ABNORMAL LOW (ref >59)
GFR calc non Af Amer: 31 mL/min/{1.73_m2} — ABNORMAL LOW (ref >59)
Glucose: 92 mg/dL (ref 65–99)
Potassium: 4.5 mmol/L (ref 3.5–5.2)
Sodium: 131 mmol/L — ABNORMAL LOW (ref 134–144)

## 2020-11-01 LAB — CBC WITH DIFFERENTIAL/PLATELET
Basophils Absolute: 0 10*3/uL (ref 0.0–0.2)
Basos: 0 %
EOS (ABSOLUTE): 0.2 10*3/uL (ref 0.0–0.4)
Eos: 2 %
Hematocrit: 44.9 % (ref 34.0–46.6)
Hemoglobin: 14.9 g/dL (ref 11.1–15.9)
Immature Grans (Abs): 0 10*3/uL (ref 0.0–0.1)
Immature Granulocytes: 0 %
Lymphocytes Absolute: 1.2 10*3/uL (ref 0.7–3.1)
Lymphs: 16 %
MCH: 28.9 pg (ref 26.6–33.0)
MCHC: 33.2 g/dL (ref 31.5–35.7)
MCV: 87 fL (ref 79–97)
Monocytes Absolute: 0.7 10*3/uL (ref 0.1–0.9)
Monocytes: 9 %
Neutrophils Absolute: 5.5 10*3/uL (ref 1.4–7.0)
Neutrophils: 73 %
Platelets: 245 10*3/uL (ref 150–450)
RBC: 5.15 x10E6/uL (ref 3.77–5.28)
RDW: 14.3 % (ref 11.7–15.4)
WBC: 7.7 10*3/uL (ref 3.4–10.8)

## 2020-11-05 ENCOUNTER — Telehealth: Payer: Self-pay

## 2020-11-05 NOTE — Telephone Encounter (Signed)
Patient aware of results.

## 2020-12-04 ENCOUNTER — Other Ambulatory Visit: Payer: Self-pay | Admitting: Family

## 2020-12-04 DIAGNOSIS — I10 Essential (primary) hypertension: Secondary | ICD-10-CM

## 2020-12-25 ENCOUNTER — Ambulatory Visit: Payer: Medicare HMO | Admitting: Family Medicine

## 2021-04-03 ENCOUNTER — Ambulatory Visit: Payer: Medicare HMO | Admitting: Family

## 2021-04-03 ENCOUNTER — Ambulatory Visit (INDEPENDENT_AMBULATORY_CARE_PROVIDER_SITE_OTHER): Payer: Medicare HMO

## 2021-04-03 DIAGNOSIS — Z0000 Encounter for general adult medical examination without abnormal findings: Secondary | ICD-10-CM | POA: Diagnosis not present

## 2021-04-03 NOTE — Progress Notes (Signed)
Subjective:   Rita Wells is a 87 y.o. female who presents for Medicare Annual (Subsequent) preventive examination.  Virtual Visit via Telephone Note  I connected with  Rita Wells on 04/03/21 at  2:00 PM EDT by telephone and verified that I am speaking with the correct person using two identifiers.  Location: Patient: Home Provider: [facility] Persons participating in the virtual visit: patient/Nurse Health Advisor   I discussed the limitations, risks, security and privacy concerns of performing an evaluation and management service by telephone and the availability of in person appointments. The patient expressed understanding and agreed to proceed.  Interactive audio and video telecommunications were attempted between this nurse and patient, however failed, due to patient having technical difficulties OR patient did not have access to video capability.  We continued and completed visit with audio only.  Some vital signs may be absent or patient reported.   Benedetta Sundstrom E Smith [place]enza, LPN  Review of Systems     Cardiac Risk Factors include: advanced age (>9men, >43 women);dyslipidemia;hypertension;sedentary lifestyle     Objective:    There were no vitals filed for this visit. There is no height or weight on file to calculate BMI.  Advanced Directives 04/03/2021 04/18/2018 04/15/2017 04/01/2017  Does Patient Have a Medical Advance Directive? Yes Yes No No  Type of Paramedic of [place];Living will Living will - -  Does patient want to make changes to medical advance directive? - No - Patient declined - -  Copy of [place] in Chart? No - copy requested - - -  Would patient like information on creating a medical advance directive? - - - No - Patient declined    Current Medications (verified) Outpatient Encounter Medications as of 04/03/2021  Medication Sig  . Docusate Sodium (DSS) 100 MG CAPS Take by mouth.  . levothyroxine (SYNTHROID) 75 MCG  tablet Take 1 tablet (75 mcg total) by mouth daily.  . metoprolol succinate (TOPROL-XL) 100 MG 24 hr tablet TAKE 1 TABLET ONCE A DAY WITH A MEAL  . rosuvastatin (CRESTOR) 20 MG tablet Take 1 tablet (20 mg total) by mouth daily.  . cetirizine (ZYRTEC ALLERGY) 10 MG tablet Take 1 tablet (10 mg total) by mouth daily. (Patient not taking: Reported on 04/03/2021)  . fluticasone (FLONASE) 50 MCG/ACT nasal spray Place 2 sprays into both nostrils daily. (Patient not taking: Reported on 04/03/2021)  . GAVILAX 17 GM/SCOOP powder SMARTSIG:1 Capful(s) By Mouth Daily (Patient not taking: Reported on 04/03/2021)  . Lactulose 20 GM/30ML SOLN Take 30 mLs (20 g total) by mouth in the morning and at bedtime. (Patient not taking: Reported on 04/03/2021)  . traZODone (DESYREL) 50 MG tablet Take 1-2 tablets (50-100 mg total) by mouth at bedtime as needed for sleep. (Patient not taking: Reported on 04/03/2021)   No facility-administered encounter medications on file as of 04/03/2021.    Allergies (verified) Azor [amlodipine-olmesartan]   History: Past Medical History:  Diagnosis Date  . Hyperlipidemia   . Hypertension   . Toxic goiter    Past Surgical History:  Procedure Laterality Date  . CERVICAL FUSION    . TUBAL LIGATION     Family History  Problem Relation Age of Onset  . Heart disease Mother        MI  . Heart attack Mother   . CVA Father   . Stroke Father   . Cancer Sister        OVARIAN  . Heart disease Brother  MI  . Heart attack Brother   . Cancer Brother   . Heart disease Brother        MI  . Cancer Sister   . Heart disease Sister    Social History   Socioeconomic History  . Marital status: Widowed    Spouse name: Not on file  . Number of children: 7  . Years of education: Not on file  . Highest education level: 10th grade  Occupational History  . 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 Occupation: Tobacco Farming  Tobacco Use  . Smoking status: Former Smoker     Quit date: 12/28/1978    Years since quitting: 42.2  . Smokeless tobacco: Never Used  Vaping Use  . Vaping Use: Never used  Substance and Sexual Activity  . Alcohol use: No  . Drug use: No  . Sexual activity: Not on file  Other Topics Concern  . Not on file  Social History Narrative   Lives alone, but grandson stays a lot, and her brother lives next door. 04/03/21   Social Determinants of Health   Financial Resource Strain: Not on file  Food Insecurity: Not on file  Transportation Needs: Not on file  Physical Activity: Not on file  Stress: Not on file  Social Connections: Not on file    Tobacco Counseling Counseling given: Not Answered   Clinical Intake:  Pre-visit preparation completed: Yes  Pain : No/denies pain     BMI - recorded: 26.29 Nutritional Status: BMI 25 -29 Overweight Nutritional Risks: None  How often do you need to have someone help you when you read instructions, pamphlets, or other written materials from your doctor or pharmacy?: 1 - Never  Diabetic? No  Interpreter Needed?: No  Information entered by :: Veniamin Kincaid, LPN   Activities of Daily Living In your present state of health, do you have any difficulty performing the following activities: 04/03/2021  Hearing? N  Vision? N  Difficulty concentrating or making decisions? N  Walking or climbing stairs? N  Dressing or bathing? N  Doing errands, shopping? N  Preparing Food and eating ? N  Using the Toilet? N  In the past six months, have you accidently leaked urine? Y  Comment wears pads, especially at night  Do you have problems with loss of bowel control? N  Managing your Medications? N  Managing your Finances? N  Housekeeping or managing your Housekeeping? N  Some recent data might be hidden    Patient Care Team: Sharion Balloon, FNP as PCP - General (Family Medicine) Marybelle Killings, MD as Consulting Physician (Orthopedic Surgery) Jacelyn Pi, MD as Consulting Physician  (Endocrinology) Ilean China, RN as Registered Nurse  Indicate any recent [place] you may have received from other than [facility] providers in the past year (date may be approximate).     Assessment:   This is a routine wellness examination for Rita Wells.  Hearing/Vision screen  Hearing Screening   125Hz  250Hz  500Hz  1000Hz  2000Hz  3000Hz  4000Hz  6000Hz  8000Hz   Right ear:           Left ear:           Comments: Has some ringing in her ears; no complaints of hearing loss  Vision Screening Comments: Wears glasses only when driving - Annual visit with Dr Marin Comment.  Dietary issues and exercise activities discussed: Current Exercise Habits: The patient does not participate in regular exercise at present, Exercise limited by:  None identified  Goals    . Chronic Disease Management Needs     CARE PLAN ENTRY (see longtitudinal plan of care for additional care plan information)  Current Barriers:  . Chronic Disease Management support, education, and care coordination needs related to HTN, hypothyroidism, HLD, GAD  Clinical Goals: . Over the next 10 days, patient will be contacted by a Care Guide to reschedule their Initial CCM Visit . Over the next 30 days, patient will have an Initial CCM Visit with a member of the embedded CCM team to discuss self-management of their chronic medical conditions  Interventions: . Chart reviewed in preparation for initial visit telephone call . Collaboration with other care team members as needed . Unsuccessful outreach to patient  . A HIPPA compliant phone message was left for the patient providing contact information and requesting a return call.  . Request sent to care guides to reach out and reschedule patient's initial visit  Patient Self Care Activities: . Undetermined   Initial goal documentation     . Exercise 150 min/wk Moderate Activity    . Have 3 meals a day      Depression Screen PHQ 2/9 Scores 04/03/2021 10/31/2020 10/03/2020 07/02/2020  01/03/2020 07/03/2019 10/11/2018  PHQ - 2 Score 0 0 0 0 0 0 0    Fall Risk Fall Risk  04/03/2021 10/31/2020 10/03/2020 07/02/2020 01/03/2020  Falls in the past year? 0 0 0 0 0  Number falls in past yr: 0 - - - -  Injury with Fall? 0 - - - -  Risk for fall due to : No Fall Risks - - - -  Follow up Falls prevention discussed - - - -    FALL RISK PREVENTION PERTAINING TO THE HOME:  Any stairs in or around the home? Yes  If so, are there any without handrails? No  Home free of loose throw rugs in walkways, pet beds, electrical cords, etc? Yes  Adequate lighting in your home to reduce risk of falls? Yes   ASSISTIVE DEVICES UTILIZED TO PREVENT FALLS:  Life alert? No  Use of a cane, walker or w/c? No  Grab bars in the bathroom? No  Shower chair or bench in shower? No  Elevated toilet seat or a handicapped toilet? No   TIMED UP AND GO:  Was the test performed? No . Telephonic visit  Cognitive Function: MMSE - Mini Mental State Exam 04/18/2018  Orientation to time 5  Orientation to Place 5  Registration 3  Attention/ Calculation 5  Recall 3  Language- name 2 objects 2  Language- repeat 1  Language- follow 3 step command 3  Language- read & follow direction 1  Write a sentence 1  Copy design 1  Total score 30        Immunizations Immunization History  Administered Date(s) Administered  . Fluad Quad(high Dose 65+) 11/06/2019, 10/17/2020  . Influenza Whole 09/27/2012  . Influenza, High Dose Seasonal PF 10/27/2017, 10/05/2018  . Influenza,inj,Quad PF,6+ Mos 09/29/2013, 10/03/2014, 10/02/2015, 11/25/2016  . Pneumococcal Conjugate-13 10/17/2020  . Pneumococcal Polysaccharide-23 10/23/1996  . Td 08/01/2004    TDAP status: Due, Education has been provided regarding the importance of this vaccine. Advised may receive this vaccine at local pharmacy or Health Dept. Aware to provide a copy of the vaccination record if obtained from local pharmacy or Health Dept. Verbalized acceptance and  understanding.  Flu Vaccine status: Up to date  Pneumococcal vaccine status: Up to date  Covid-19 vaccine status: Declined,  Education has been provided regarding the importance of this vaccine but patient still declined. Advised may receive this vaccine at local pharmacy or Health Dept.or vaccine clinic. Aware to provide a copy of the vaccination record if obtained from local pharmacy or Health Dept. Verbalized acceptance and understanding.  Qualifies for Shingles Vaccine? Yes   Zostavax completed No   Shingrix Completed?: No.    Education has been provided regarding the importance of this vaccine. Patient has been advised to call insurance company to determine out of pocket expense if they have not yet received this vaccine. Advised may also receive vaccine at local pharmacy or Health Dept. Verbalized acceptance and understanding.  Screening Tests Health Maintenance  Topic Date Due  . TETANUS/TDAP  08/01/2014  . COVID-19 Vaccine (1) 04/19/2021 (Originally 09/12/1938)  . INFLUENZA VACCINE  07/28/2021  . PNA vac Low Risk Adult (2 of 2 - PPSV23) 10/17/2021  . DEXA SCAN  10/03/2022  . HPV VACCINES  Aged Out    Health Maintenance  Health Maintenance Due  Topic Date Due  . TETANUS/TDAP  08/01/2014    Colorectal cancer screening: No longer required.   Mammogram status: No longer required due to age.  Bone Density status: Completed 10/03/2020. Results reflect: Bone density results: OSTEOPENIA. Repeat every 2 years.  Lung Cancer Screening: (Low Dose CT Chest recommended if Age 65-80 years, 30 pack-year currently smoking OR have quit w/in 15years.) does not qualify.   Additional Screening:  Hepatitis C Screening: does not qualify  Vision Screening: Recommended annual ophthalmology exams for early detection of glaucoma and other disorders of the eye. Is the patient up to date with their annual eye exam?  Yes  Who is the provider or what is the name of the office in which the patient  attends annual eye exams? Dr Marin Comment If pt is not established with a provider, would they like to be referred to a provider to establish care? No .   Dental Screening: Recommended annual dental exams for proper oral hygiene  Community Resource Referral / Chronic Care Management: CRR required this visit?  No   CCM required this visit?  No      Plan:     I have personally reviewed and noted the following in the patient's chart:   . Medical and social history . Use of alcohol, tobacco or illicit drugs  . Current medications and supplements . Functional ability and status . Nutritional status . Physical activity . Advanced directives . List of other physicians . Hospitalizations, surgeries, and ER visits in previous 12 months . Vitals . Screenings to include cognitive, depression, and falls . Referrals and appointments  In addition, I have reviewed and discussed with patient certain preventive protocols, quality metrics, and best practice recommendations. A written personalized care plan for preventive services as well as general preventive health recommendations were provided to patient.     Sandrea Hammond, LPN   08/05/2799   Nurse Notes: None

## 2021-04-03 NOTE — Patient Instructions (Signed)
Rita Wells , Thank you for taking time to come for your Medicare Wellness Visit. I appreciate your ongoing commitment to your health goals. Please review the following plan we discussed and let me know if I can assist you in the future.   Screening recommendations/referrals: Colonoscopy: Done 12/30/2010 - No longer required due to age 87: No longer required due to age Bone Density: Done 10/03/20 - Repeat every 2 years Recommended yearly ophthalmology/optometry visit for glaucoma screening and checkup Recommended yearly dental visit for hygiene and checkup  Vaccinations: Influenza vaccine: Done 10/17/2020 - Repeat every year Pneumococcal vaccine: Done 10/23/1996 & 10/17/2020 Tdap vaccine: Done 08/01/2004 - Due for repeat (every 10 years) Shingles vaccine: Shingrix discussed. Please contact your pharmacy for coverage information.    Covid-19:Declined  Advanced directives: Please bring a copy of your health care power of attorney and living will to the office to be added to your chart at your convenience.  Conditions/risks identified: Continue fall prevention, try to walk 30 minutes each day.  Next appointment: Follow up in one year for your annual wellness visit    Preventive Care 65 Years and Older, Female Preventive care refers to lifestyle choices and visits with your health care provider that can promote health and wellness. What does preventive care include?  A yearly physical exam. This is also called an annual well check.  Dental exams once or twice a year.  Routine eye exams. Ask your health care provider how often you should have your eyes checked.  Personal lifestyle choices, including:  Daily care of your teeth and gums.  Regular physical activity.  Eating a healthy diet.  Avoiding tobacco and drug use.  Limiting alcohol use.  Practicing safe sex.  Taking low-dose aspirin every day.  Taking vitamin and mineral supplements as recommended by your health  care provider. What happens during an annual well check? The services and screenings done by your health care provider during your annual well check will depend on your age, overall health, lifestyle risk factors, and family history of disease. Counseling  Your health care provider may ask you questions about your:  Alcohol use.  Tobacco use.  Drug use.  Emotional well-being.  Home and relationship well-being.  Sexual activity.  Eating habits.  History of falls.  Memory and ability to understand (cognition).  Work and work Statistician.  Reproductive health. Screening  You may have the following tests or measurements:  Height, weight, and BMI.  Blood pressure.  Lipid and cholesterol levels. These may be checked every 5 years, or more frequently if you are over 38 years old.  Skin check.  Lung cancer screening. You may have this screening every year starting at age 71 if you have a 30-pack-year history of smoking and currently smoke or have quit within the past 15 years.  Fecal occult blood test (FOBT) of the stool. You may have this test every year starting at age 33.  Flexible sigmoidoscopy or colonoscopy. You may have a sigmoidoscopy every 5 years or a colonoscopy every 10 years starting at age 76.  Hepatitis C blood test.  Hepatitis B blood test.  Sexually transmitted disease (STD) testing.  Diabetes screening. This is done by checking your blood sugar (glucose) after you have not eaten for a while (fasting). You may have this done every 1-3 years.  Bone density scan. This is done to screen for osteoporosis. You may have this done starting at age 64.  Mammogram. This may be done every 1-2 years.  Talk to your health care provider about how often you should have regular mammograms. Talk with your health care provider about your test results, treatment options, and if necessary, the need for more tests. Vaccines  Your health care provider may recommend certain  vaccines, such as:  Influenza vaccine. This is recommended every year.  Tetanus, diphtheria, and acellular pertussis (Tdap, Td) vaccine. You may need a Td booster every 10 years.  Zoster vaccine. You may need this after age 23.  Pneumococcal 13-valent conjugate (PCV13) vaccine. One dose is recommended after age 22.  Pneumococcal polysaccharide (PPSV23) vaccine. One dose is recommended after age 70. Talk to your health care provider about which screenings and vaccines you need and how often you need them. This information is not intended to replace advice given to you by your health care provider. Make sure you discuss any questions you have with your health care provider. Document Released: 01/10/2016 Document Revised: 09/02/2016 Document Reviewed: 10/15/2015 Elsevier Interactive Patient Education  2017 [place] Prevention in the Home Falls can cause injuries. They can happen to people of all ages. There are many things you can do to make your home safe and to help prevent falls. What can I do on the outside of my home?  Regularly fix the edges of walkways and driveways and fix any cracks.  Remove anything that might make you trip as you walk through a door, such as a raised step or threshold.  Trim any bushes or trees on the path to your home.  Use bright outdoor lighting.  Clear any walking paths of anything that might make someone trip, such as rocks or tools.  Regularly check to see if handrails are loose or broken. Make sure that both sides of any steps have handrails.  Any raised decks and porches should have guardrails on the edges.  Have any leaves, snow, or ice cleared regularly.  Use sand or salt on walking paths during winter.  Clean up any spills in your garage right away. This includes oil or grease spills. What can I do in the bathroom?  Use night lights.  Install grab bars by the toilet and in the tub and shower. Do not use towel bars as grab  bars.  Use non-skid mats or decals in the tub or shower.  If you need to sit down in the shower, use a plastic, non-slip stool.  Keep the floor dry. Clean up any water that spills on the floor as soon as it happens.  Remove soap buildup in the tub or shower regularly.  Attach bath mats securely with double-sided non-slip rug tape.  Do not have throw rugs and other things on the floor that can make you trip. What can I do in the bedroom?  Use night lights.  Make sure that you have a light by your bed that is easy to reach.  Do not use any sheets or blankets that are too big for your bed. They should not hang down onto the floor.  Have a firm chair that has side arms. You can use this for support while you get dressed.  Do not have throw rugs and other things on the floor that can make you trip. What can I do in the kitchen?  Clean up any spills right away.  Avoid walking on wet floors.  Keep items that you use a lot in easy-to-reach places.  If you need to reach something above you, use a strong step stool  that has a grab bar.  Keep electrical cords out of the way.  Do not use floor polish or wax that makes floors slippery. If you must use wax, use non-skid floor wax.  Do not have throw rugs and other things on the floor that can make you trip. What can I do with my stairs?  Do not leave any items on the stairs.  Make sure that there are handrails on both sides of the stairs and use them. Fix handrails that are broken or loose. Make sure that handrails are as long as the stairways.  Check any carpeting to make sure that it is firmly attached to the stairs. Fix any carpet that is loose or worn.  Avoid having throw rugs at the top or bottom of the stairs. If you do have throw rugs, attach them to the floor with carpet tape.  Make sure that you have a light switch at the top of the stairs and the bottom of the stairs. If you do not have them, ask someone to add them for  you. What else can I do to help prevent falls?  Wear shoes that:  Do not have high heels.  Have rubber bottoms.  Are comfortable and fit you well.  Are closed at the toe. Do not wear sandals.  If you use a stepladder:  Make sure that it is fully opened. Do not climb a closed stepladder.  Make sure that both sides of the stepladder are locked into place.  Ask someone to hold it for you, if possible.  Clearly mark and make sure that you can see:  Any grab bars or handrails.  First and last steps.  Where the edge of each step is.  Use tools that help you move around (mobility aids) if they are needed. These include:  Canes.  Walkers.  Scooters.  Crutches.  Turn on the lights when you go into a dark area. Replace any light bulbs as soon as they burn out.  Set up your furniture so you have a clear path. Avoid moving your furniture around.  If any of your floors are uneven, fix them.  If there are any pets around you, be aware of where they are.  Review your medicines with your doctor. Some medicines can make you feel dizzy. This can increase your chance of falling. Ask your doctor what other things that you can do to help prevent falls. This information is not intended to replace advice given to you by your health care provider. Make sure you discuss any questions you have with your health care provider. Document Released: 10/10/2009 Document Revised: 05/21/2016 Document Reviewed: 01/18/2015 Elsevier Interactive Patient Education  2017 [employer].

## 2021-04-22 ENCOUNTER — Encounter: Payer: Self-pay | Admitting: Family

## 2021-04-22 ENCOUNTER — Ambulatory Visit (INDEPENDENT_AMBULATORY_CARE_PROVIDER_SITE_OTHER): Payer: Medicare HMO | Admitting: Family

## 2021-04-22 ENCOUNTER — Other Ambulatory Visit: Payer: Self-pay

## 2021-04-22 VITALS — BP 161/99 | HR 61 | Temp 97.5°F | Ht 63.0 in | Wt 143.6 lb

## 2021-04-22 DIAGNOSIS — E7849 Other hyperlipidemia: Secondary | ICD-10-CM

## 2021-04-22 DIAGNOSIS — F411 Generalized anxiety disorder: Secondary | ICD-10-CM

## 2021-04-22 DIAGNOSIS — M5134 Other intervertebral disc degeneration, thoracic region: Secondary | ICD-10-CM | POA: Diagnosis not present

## 2021-04-22 DIAGNOSIS — E039 Hypothyroidism, unspecified: Secondary | ICD-10-CM

## 2021-04-22 DIAGNOSIS — M5432 Sciatica, left side: Secondary | ICD-10-CM | POA: Diagnosis not present

## 2021-04-22 DIAGNOSIS — J449 Chronic obstructive pulmonary disease, unspecified: Secondary | ICD-10-CM | POA: Diagnosis not present

## 2021-04-22 DIAGNOSIS — M5030 Other cervical disc degeneration, unspecified cervical region: Secondary | ICD-10-CM

## 2021-04-22 DIAGNOSIS — I10 Essential (primary) hypertension: Secondary | ICD-10-CM

## 2021-04-22 MED ORDER — CETIRIZINE HCL 10 MG PO TABS: 10.0000 mg | ORAL_TABLET | Freq: Every day | ORAL | 3 refills | Status: DC

## 2021-04-22 MED ORDER — METOPROLOL SUCCINATE ER 100 MG PO TB24
ORAL_TABLET | ORAL | 1 refills | Status: DC
Start: 2021-04-22 — End: 2021-12-08

## 2021-04-22 MED ORDER — LEVOTHYROXINE SODIUM 75 MCG PO TABS: 75.0000 ug | ORAL_TABLET | Freq: Every day | ORAL | 3 refills | Status: DC

## 2021-04-22 NOTE — Progress Notes (Signed)
Subjective:    Patient ID: Rita Wells, female    DOB: 04-21-1933, 87 y.o.   MRN: [id number]  Chief Complaint  Patient presents with  . Hypertension    6 month follow up    Pt presents to the office today for chronic follow up.  Hypertension This is a chronic problem. The current episode started more than 1 year ago. The problem has been waxing and waning since onset. The problem is uncontrolled. Associated symptoms include anxiety and malaise/fatigue. Pertinent negatives include no peripheral edema or shortness of breath. Risk factors for coronary artery disease include dyslipidemia and sedentary lifestyle. The current treatment provides moderate improvement. Identifiable causes of hypertension include a thyroid problem.  Thyroid Problem Presents for follow-up visit. Symptoms include anxiety, constipation, depressed mood, fatigue and hoarse voice. Patient reports no dry skin. The symptoms have been stable. Her past medical history is significant for hyperlipidemia.  Constipation This is a chronic problem. The current episode started more than 1 year ago. The problem has been resolved since onset. Her stool frequency is 1 time per day. She has tried laxatives for the symptoms. The treatment provided moderate relief.  Anxiety Presents for follow-up visit. Symptoms include depressed mood, insomnia, irritability and nervous/anxious behavior. Patient reports no shortness of breath. Symptoms occur occasionally. The severity of symptoms is moderate.    Hyperlipidemia This is a chronic problem. The current episode started more than 1 year ago. The problem is controlled. Exacerbating diseases include obesity. Pertinent negatives include no shortness of breath. The current treatment provides moderate improvement of lipids. Risk factors for coronary artery disease include dyslipidemia, diabetes mellitus, hypertension and a sedentary lifestyle.  Insomnia Primary symptoms: difficulty falling  asleep, malaise/fatigue.  The current episode started more than one year. The onset quality is gradual. The problem occurs intermittently.  Arthritis Presents for follow-up visit. She complains of pain and stiffness. The symptoms have been stable. Affected locations include the left knee, right knee, left MCP, right MCP and neck (back). Her pain is at a severity of 0/10. Associated symptoms include fatigue.   COPD States her breathing is stable, but slightly worse with the pollen. Not using any inhalers.    Review of Systems  Constitutional: Positive for fatigue, irritability and malaise/fatigue.  HENT: Positive for hoarse voice.   Respiratory: Negative for shortness of breath.   Gastrointestinal: Positive for constipation.  Musculoskeletal: Positive for arthritis and stiffness.  Psychiatric/Behavioral: The patient is nervous/anxious and has insomnia.   All other systems reviewed and are negative.      Objective:   Physical Exam Vitals reviewed.  Constitutional:      General: She is not in acute distress.    Appearance: She is well-developed.  HENT:     Head: Normocephalic and atraumatic.     Comments: Hoarse voice     Right Ear: Tympanic membrane normal.     Left Ear: Tympanic membrane normal.  Eyes:     Pupils: Pupils are equal, round, and reactive to light.  Neck:     Thyroid: No thyromegaly.  Cardiovascular:     Rate and Rhythm: Normal rate and regular rhythm.     Heart sounds: Normal heart sounds. No murmur heard.   Pulmonary:     Effort: Pulmonary effort is normal. No respiratory distress.     Breath sounds: Normal breath sounds. No wheezing.  Abdominal:     General: Bowel sounds are normal. There is no distension.     Palpations: Abdomen is  soft.     Tenderness: There is no abdominal tenderness.  Musculoskeletal:        General: No tenderness. Normal range of motion.     Cervical back: Normal range of motion and neck supple.  Skin:    General: Skin is warm  and dry.  Neurological:     Mental Status: She is alert and oriented to person, place, and time.     Cranial Nerves: No cranial nerve deficit.     Deep Tendon Reflexes: Reflexes are normal and symmetric.  Psychiatric:        Behavior: Behavior normal.        Thought Content: Thought content normal.        Judgment: Judgment normal.       BP (!) 179/87   Pulse 61   Temp (!) 97.5 F (36.4 C) (Temporal)   Ht _0  (1.6 m)   Wt 143 lb 9.6 oz (65.1 kg)   BMI 25.44 kg/m      Assessment & Plan:  Rita Wells comes in today with chief complaint of Hypertension (6 month follow up)   Diagnosis and orders addressed:  1. Essential hypertension PT states she was "rushing this morning " and hard to breath with her mask Will Continue to monitor Will adjust medication on next if still elevated, last visit stable - metoprolol succinate (TOPROL-XL) 100 MG 24 hr tablet; TAKE 1 TABLET ONCE A DAY WITH A MEAL  Dispense: 90 tablet; Refill: 1 - CMP14+EGFR - CBC with Differential/Platelet  2. Hypothyroidism, unspecified type - levothyroxine (SYNTHROID) 75 MCG tablet; Take 1 tablet (75 mcg total) by mouth daily.  Dispense: 90 tablet; Refill: 3 - CMP14+EGFR - CBC with Differential/Platelet - TSH  3. Primary hypertension - CMP14+EGFR - CBC with Differential/Platelet  4. Chronic obstructive pulmonary disease, unspecified COPD type ([place]) - CMP14+EGFR - CBC with Differential/Platelet  5. Sciatica of left side - CMP14+EGFR - CBC with Differential/Platelet  6. DDD (degenerative disc disease), cervical - CMP14+EGFR - CBC with Differential/Platelet  7. DDD (degenerative disc disease), thoraci - CMP14+EGFR - CBC with Differential/Platelet  8. Generalized anxiety disorder - CMP14+EGFR - CBC with Differential/Platelet  9. Other hyperlipidemia  - CMP14+EGFR - CBC with Differential/Platelet   Labs pending Health Maintenance reviewed Diet and exercise encouraged  Follow up  plan: 4 months    Evelina Dun, FNP

## 2021-04-22 NOTE — Patient Instructions (Signed)

## 2021-04-23 LAB — CMP14+EGFR
ALT: 13 IU/L (ref 0–32)
AST: 16 IU/L (ref 0–40)
Albumin/Globulin Ratio: 1.7 (ref 1.2–2.2)
Albumin: 4 g/dL (ref 3.6–4.6)
Alkaline Phosphatase: 94 IU/L (ref 44–121)
BUN/Creatinine Ratio: 12 (ref 12–28)
BUN: 21 mg/dL (ref 8–27)
Bilirubin Total: 0.6 mg/dL (ref 0.0–1.2)
CO2: 24 mmol/L (ref 20–29)
Calcium: 9.7 mg/dL (ref 8.7–10.3)
Chloride: 99 mmol/L (ref 96–106)
Creatinine, Ser: 1.69 mg/dL — ABNORMAL HIGH (ref 0.57–1.00)
Globulin, Total: 2.4 g/dL (ref 1.5–4.5)
Glucose: 94 mg/dL (ref 65–99)
Potassium: 3.9 mmol/L (ref 3.5–5.2)
Sodium: 138 mmol/L (ref 134–144)
Total Protein: 6.4 g/dL (ref 6.0–8.5)
eGFR: 29 mL/min/{1.73_m2} — ABNORMAL LOW (ref >59)

## 2021-04-23 LAB — CBC WITH DIFFERENTIAL/PLATELET
Basophils Absolute: 0 10*3/uL (ref 0.0–0.2)
Basos: 1 %
EOS (ABSOLUTE): 0.4 10*3/uL (ref 0.0–0.4)
Eos: 5 %
Hematocrit: 42.9 % (ref 34.0–46.6)
Hemoglobin: 14.6 g/dL (ref 11.1–15.9)
Immature Grans (Abs): 0 10*3/uL (ref 0.0–0.1)
Immature Granulocytes: 0 %
Lymphocytes Absolute: 1.6 10*3/uL (ref 0.7–3.1)
Lymphs: 18 %
MCH: 28.9 pg (ref 26.6–33.0)
MCHC: 34 g/dL (ref 31.5–35.7)
MCV: 85 fL (ref 79–97)
Monocytes Absolute: 0.7 10*3/uL (ref 0.1–0.9)
Monocytes: 8 %
Neutrophils Absolute: 6 10*3/uL (ref 1.4–7.0)
Neutrophils: 68 %
Platelets: 210 10*3/uL (ref 150–450)
RBC: 5.05 x10E6/uL (ref 3.77–5.28)
RDW: 14.1 % (ref 11.7–15.4)
WBC: 8.7 10*3/uL (ref 3.4–10.8)

## 2021-04-23 LAB — TSH: TSH: 2.87 u[IU]/mL (ref 0.450–4.500)

## 2021-04-25 ENCOUNTER — Telehealth: Payer: Self-pay

## 2021-04-25 NOTE — Telephone Encounter (Signed)
Lmtcb.

## 2021-06-17 ENCOUNTER — Encounter: Payer: Self-pay | Admitting: *Deleted

## 2021-07-12 ENCOUNTER — Other Ambulatory Visit: Payer: Self-pay | Admitting: Family

## 2021-07-12 DIAGNOSIS — E7849 Other hyperlipidemia: Secondary | ICD-10-CM

## 2021-10-11 ENCOUNTER — Other Ambulatory Visit: Payer: Self-pay | Admitting: Family

## 2021-10-11 DIAGNOSIS — E7849 Other hyperlipidemia: Secondary | ICD-10-CM

## 2021-10-20 ENCOUNTER — Ambulatory Visit (INDEPENDENT_AMBULATORY_CARE_PROVIDER_SITE_OTHER): Payer: Medicare HMO | Admitting: Family

## 2021-10-20 ENCOUNTER — Other Ambulatory Visit: Payer: Self-pay

## 2021-10-20 ENCOUNTER — Encounter: Payer: Self-pay | Admitting: Family

## 2021-10-20 VITALS — BP 168/77 | HR 57 | Temp 96.2°F | Ht 63.0 in | Wt 135.6 lb

## 2021-10-20 DIAGNOSIS — F411 Generalized anxiety disorder: Secondary | ICD-10-CM | POA: Diagnosis not present

## 2021-10-20 DIAGNOSIS — E7849 Other hyperlipidemia: Secondary | ICD-10-CM

## 2021-10-20 DIAGNOSIS — I10 Essential (primary) hypertension: Secondary | ICD-10-CM

## 2021-10-20 DIAGNOSIS — E039 Hypothyroidism, unspecified: Secondary | ICD-10-CM | POA: Diagnosis not present

## 2021-10-20 DIAGNOSIS — J449 Chronic obstructive pulmonary disease, unspecified: Secondary | ICD-10-CM

## 2021-10-20 DIAGNOSIS — Z23 Encounter for immunization: Secondary | ICD-10-CM

## 2021-10-20 MED ORDER — ROSUVASTATIN CALCIUM 20 MG PO TABS: 20.0000 mg | ORAL_TABLET | Freq: Every day | ORAL | 3 refills | Status: DC

## 2021-10-20 NOTE — Addendum Note (Signed)
Addended by: Brynda Peon F on: 10/20/2021 12:09 PM   Modules accepted: Orders

## 2021-10-20 NOTE — Patient Instructions (Signed)
Health Maintenance After Age 85 After age 85, you are at a higher risk for certain long-term diseases and infections as well as injuries from falls. Falls are a major cause of broken bones and head injuries in people who are older than age 85. Getting regular preventive care can help to keep you healthy and well. Preventive care includes getting regular testing and making lifestyle changes as recommended by your health care provider. Talk with your health care provider about: Which screenings and tests you should have. A screening is a test that checks for a disease when you have no symptoms. A diet and exercise plan that is right for you. What should I know about screenings and tests to prevent falls? Screening and testing are the best ways to find a health problem early. Early diagnosis and treatment give you the best chance of managing medical conditions that are common after age 85. Certain conditions and lifestyle choices may make you more likely to have a fall. Your health care provider may recommend: Regular vision checks. Poor vision and conditions such as cataracts can make you more likely to have a fall. If you wear glasses, make sure to get your prescription updated if your vision changes. Medicine review. Work with your health care provider to regularly review all of the medicines you are taking, including over-the-counter medicines. Ask your health care provider about any side effects that may make you more likely to have a fall. Tell your health care provider if any medicines that you take make you feel dizzy or sleepy. Osteoporosis screening. Osteoporosis is a condition that causes the bones to get weaker. This can make the bones weak and cause them to break more easily. Blood pressure screening. Blood pressure changes and medicines to control blood pressure can make you feel dizzy. Strength and balance checks. Your health care provider may recommend certain tests to check your strength and  balance while standing, walking, or changing positions. Foot health exam. Foot pain and numbness, as well as not wearing proper footwear, can make you more likely to have a fall. Depression screening. You may be more likely to have a fall if you have a fear of falling, feel emotionally low, or feel unable to do activities that you used to do. Alcohol use screening. Using too much alcohol can affect your balance and may make you more likely to have a fall. What actions can I take to lower my risk of falls? General instructions Talk with your health care provider about your risks for falling. Tell your health care provider if: You fall. Be sure to tell your health care provider about all falls, even ones that seem minor. You feel dizzy, sleepy, or off-balance. Take over-the-counter and prescription medicines only as told by your health care provider. These include any supplements. Eat a healthy diet and maintain a healthy weight. A healthy diet includes low-fat dairy products, low-fat (lean) meats, and fiber from whole grains, beans, and lots of fruits and vegetables. Home safety Remove any tripping hazards, such as rugs, cords, and clutter. Install safety equipment such as grab bars in bathrooms and safety rails on stairs. Keep rooms and walkways well-lit. Activity  Follow a regular exercise program to stay fit. This will help you maintain your balance. Ask your health care provider what types of exercise are appropriate for you. If you need a cane or walker, use it as recommended by your health care provider. Wear supportive shoes that have nonskid soles. Lifestyle Do not   drink alcohol if your health care provider tells you not to drink. If you drink alcohol, limit how much you have: 0-1 drink a day for women. 0-2 drinks a day for men. Be aware of how much alcohol is in your drink. In the [place] one drink equals one typical bottle of beer (12 oz), one-half glass of wine (5 oz), or one shot of  hard liquor (1 oz). Do not use any products that contain nicotine or tobacco, such as cigarettes and e-cigarettes. If you need help quitting, ask your health care provider. Summary Having a healthy lifestyle and getting preventive care can help to protect your health and wellness after age 85. Screening and testing are the best way to find a health problem early and help you avoid having a fall. Early diagnosis and treatment give you the best chance for managing medical conditions that are more common for people who are older than age 85. Falls are a major cause of broken bones and head injuries in people who are older than age 85. Take precautions to prevent a fall at home. Work with your health care provider to learn what changes you can make to improve your health and wellness and to prevent falls. This information is not intended to replace advice given to you by your health care provider. Make sure you discuss any questions you have with your health care provider. Document Revised: 02/21/2021 Document Reviewed: 11/29/2020 Elsevier Patient Education  2022 [employer] Inc[employer]  

## 2021-10-20 NOTE — Progress Notes (Signed)
Subjective:    Patient ID: Rita Wells, female    DOB: [birth year], 87 y.o.   MRN: [id number]  Chief Complaint  Patient presents with   Medical Management of Chronic Issues   Pt presents to the office today for chronic follow up. She has COPD, but states this is stable. Quit smoking approx 2012.  Hypertension This is a chronic problem. The current episode started more than 1 year ago. The problem has been waxing and waning since onset. The problem is uncontrolled. Pertinent negatives include no malaise/fatigue, peripheral edema or shortness of breath. Risk factors for coronary artery disease include dyslipidemia. Past treatments include beta blockers. The current treatment provides moderate improvement. Identifiable causes of hypertension include a thyroid problem.  Thyroid Problem Presents for follow-up visit. Symptoms include constipation and hoarse voice. Patient reports no diarrhea. The symptoms have been stable. Her past medical history is significant for hyperlipidemia.  Hyperlipidemia This is a chronic problem. The current episode started more than 1 year ago. The problem is uncontrolled. Pertinent negatives include no shortness of breath. Current antihyperlipidemic treatment includes statins. The current treatment provides moderate improvement of lipids. Risk factors for coronary artery disease include dyslipidemia, hypertension, a sedentary lifestyle and post-menopausal.     Review of Systems  Constitutional:  Negative for malaise/fatigue.  HENT:  Positive for hoarse voice.   Respiratory:  Negative for shortness of breath.   Gastrointestinal:  Positive for constipation. Negative for diarrhea.  All other systems reviewed and are negative.     Objective:   Physical Exam Vitals reviewed.  Constitutional:      General: She is not in acute distress.    Appearance: She is well-developed.  HENT:     Head: Normocephalic and atraumatic.     Right Ear: Tympanic membrane normal.      Left Ear: Tympanic membrane normal.  Eyes:     Pupils: Pupils are equal, round, and reactive to light.  Neck:     Thyroid: No thyromegaly.  Cardiovascular:     Rate and Rhythm: Normal rate and regular rhythm.     Heart sounds: Normal heart sounds. No murmur heard. Pulmonary:     Effort: Pulmonary effort is normal. No respiratory distress.     Breath sounds: Normal breath sounds. No wheezing.  Abdominal:     General: Bowel sounds are normal. There is no distension.     Palpations: Abdomen is soft.     Tenderness: There is no abdominal tenderness.  Musculoskeletal:        General: No tenderness. Normal range of motion.     Cervical back: Normal range of motion and neck supple.  Skin:    General: Skin is warm and dry.  Neurological:     Mental Status: She is alert and oriented to person, place, and time.     Cranial Nerves: No cranial nerve deficit.     Deep Tendon Reflexes: Reflexes are normal and symmetric.  Psychiatric:        Behavior: Behavior normal.        Thought Content: Thought content normal.        Judgment: Judgment normal.         BP (!) 168/77   Pulse (!) 57   Temp (!) 96.2 F (35.7 C) (Temporal)   Ht 5' 3"  (1.6 m)   Wt 135 lb 9.6 oz (61.5 kg)   BMI 24.02 kg/m   Assessment & Plan:  Bemnet Trovato Lemberger comes in today with chief  complaint of Medical Management of Chronic Issues   Diagnosis and orders addressed:  1. Other hyperlipidemia - rosuvastatin (CRESTOR) 20 MG tablet; Take 1 tablet (20 mg total) by mouth daily.  Dispense: 90 tablet; Refill: 3 - CMP14+EGFR  2. Need for immunization against influenza - Flu Vaccine QUAD High Dose(Fluad) - CMP14+EGFR  3. Primary hypertension - CMP14+EGFR  4. Chronic obstructive pulmonary disease, unspecified COPD type ([place]) - CMP14+EGFR  5. Hypothyroidism, unspecified type - CMP14+EGFR - TSH  6. Generalized anxiety disorder - CMP14+EGFR   Labs pending Health Maintenance reviewed Diet and exercise  encouraged  Follow up plan: 6 months    Evelina Dun, FNP

## 2021-10-21 LAB — CMP14+EGFR
ALT: 13 IU/L (ref 0–32)
AST: 23 IU/L (ref 0–40)
Albumin/Globulin Ratio: 1.7 (ref 1.2–2.2)
Albumin: 4 g/dL (ref 3.6–4.6)
Alkaline Phosphatase: 124 IU/L — ABNORMAL HIGH (ref 44–121)
BUN/Creatinine Ratio: 9 — ABNORMAL LOW (ref 12–28)
BUN: 17 mg/dL (ref 8–27)
Bilirubin Total: 0.6 mg/dL (ref 0.0–1.2)
CO2: 21 mmol/L (ref 20–29)
Calcium: 9.9 mg/dL (ref 8.7–10.3)
Chloride: 100 mmol/L (ref 96–106)
Creatinine, Ser: 1.94 mg/dL — ABNORMAL HIGH (ref 0.57–1.00)
Globulin, Total: 2.4 g/dL (ref 1.5–4.5)
Glucose: 88 mg/dL (ref 70–99)
Potassium: 5.1 mmol/L (ref 3.5–5.2)
Sodium: 139 mmol/L (ref 134–144)
Total Protein: 6.4 g/dL (ref 6.0–8.5)
eGFR: 25 mL/min/{1.73_m2} — ABNORMAL LOW (ref >59)

## 2021-10-21 LAB — TSH: TSH: 2.55 u[IU]/mL (ref 0.450–4.500)

## 2021-10-23 ENCOUNTER — Ambulatory Visit: Payer: Medicare HMO | Admitting: Family

## 2021-12-08 ENCOUNTER — Other Ambulatory Visit: Payer: Self-pay | Admitting: Family

## 2021-12-08 DIAGNOSIS — I10 Essential (primary) hypertension: Secondary | ICD-10-CM

## 2022-04-06 ENCOUNTER — Ambulatory Visit (INDEPENDENT_AMBULATORY_CARE_PROVIDER_SITE_OTHER): Payer: Medicare HMO

## 2022-04-06 VITALS — Ht 63.0 in | Wt 135.0 lb

## 2022-04-06 DIAGNOSIS — Z0000 Encounter for general adult medical examination without abnormal findings: Secondary | ICD-10-CM | POA: Diagnosis not present

## 2022-04-06 NOTE — Patient Instructions (Signed)
Rita Wells , ?Thank you for taking time to come for your Medicare Wellness Visit. I appreciate your ongoing commitment to your health goals. Please review the following plan we discussed and let me know if I can assist you in the future.  ? ?Screening recommendations/referrals: ?Colonoscopy: No longer required. ?Mammogram: No longer required. ?Bone Density: Done 10/03/2020 Repeat every 2 years ? ?Recommended yearly ophthalmology/optometry visit for glaucoma screening and checkup ?Recommended yearly dental visit for hygiene and checkup ? ?Vaccinations: ?Influenza vaccine: Done 10/20/2021. Repeat annually ? ?Pneumococcal vaccine: Done 10/17/2020, 10/20/2021, 10/23/1996 ?Tdap vaccine: Done 08/01/2004 Repeat in 10 years ? ?Shingles vaccine: Discussed.   ?Covid-19:Declined. ? ?Advanced directives: Advance directive discussed with you today. Even though you declined this today, please call our office should you change your mind, and we can give you the proper paperwork for you to fill out. ? ? ?Conditions/risks identified: KEEP UP THE GOOD WORK!! ? ?Next appointment: Follow up in one year for your annual wellness visit 2024. ? ? ?Preventive Care 39 Years and Older, Female ?Preventive care refers to lifestyle choices and visits with your health care provider that can promote health and wellness. ?What does preventive care include? ?A yearly physical exam. This is also called an annual well check. ?Dental exams once or twice a year. ?Routine eye exams. Ask your health care provider how often you should have your eyes checked. ?Personal lifestyle choices, including: ?Daily care of your teeth and gums. ?Regular physical activity. ?Eating a healthy diet. ?Avoiding tobacco and drug use. ?Limiting alcohol use. ?Practicing safe sex. ?Taking low-dose aspirin every day. ?Taking vitamin and mineral supplements as recommended by your health care provider. ?What happens during an annual well check? ?The services and screenings done by  your health care provider during your annual well check will depend on your age, overall health, lifestyle risk factors, and family history of disease. ?Counseling  ?Your health care provider may ask you questions about your: ?Alcohol use. ?Tobacco use. ?Drug use. ?Emotional well-being. ?Home and relationship well-being. ?Sexual activity. ?Eating habits. ?History of falls. ?Memory and ability to understand (cognition). ?Work and work Statistician. ?Reproductive health. ?Screening  ?You may have the following tests or measurements: ?Height, weight, and BMI. ?Blood pressure. ?Lipid and cholesterol levels. These may be checked every 5 years, or more frequently if you are over 21 years old. ?Skin check. ?Lung cancer screening. You may have this screening every year starting at age 57 if you have a 30-pack-year history of smoking and currently smoke or have quit within the past 15 years. ?Fecal occult blood test (FOBT) of the stool. You may have this test every year starting at age 2. ?Flexible sigmoidoscopy or colonoscopy. You may have a sigmoidoscopy every 5 years or a colonoscopy every 10 years starting at age 101. ?Hepatitis C blood test. ?Hepatitis B blood test. ?Sexually transmitted disease (STD) testing. ?Diabetes screening. This is done by checking your blood sugar (glucose) after you have not eaten for a while (fasting). You may have this done every 1-3 years. ?Bone density scan. This is done to screen for osteoporosis. You may have this done starting at age 44. ?Mammogram. This may be done every 1-2 years. Talk to your health care provider about how often you should have regular mammograms. ?Talk with your health care provider about your test results, treatment options, and if necessary, the need for more tests. ?Vaccines  ?Your health care provider may recommend certain vaccines, such as: ?Influenza vaccine. This is recommended every year. ?  Tetanus, diphtheria, and acellular pertussis (Tdap, Td) vaccine. You  may need a Td booster every 10 years. ?Zoster vaccine. You may need this after age 35. ?Pneumococcal 13-valent conjugate (PCV13) vaccine. One dose is recommended after age 27. ?Pneumococcal polysaccharide (PPSV23) vaccine. One dose is recommended after age 23. ?Talk to your health care provider about which screenings and vaccines you need and how often you need them. ?This information is not intended to replace advice given to you by your health care provider. Make sure you discuss any questions you have with your health care provider. ?Document Released: 01/10/2016 Document Revised: 09/02/2016 Document Reviewed: 10/15/2015 ?Elsevier Interactive Patient Education ? 2017 [employer]. ? ?Fall Prevention in the Home ?Falls can cause injuries. They can happen to people of all ages. There are many things you can do to make your home safe and to help prevent falls. ?What can I do on the outside of my home? ?Regularly fix the edges of walkways and driveways and fix any cracks. ?Remove anything that might make you trip as you walk through a door, such as a raised step or threshold. ?Trim any bushes or trees on the path to your home. ?Use bright outdoor lighting. ?Clear any walking paths of anything that might make someone trip, such as rocks or tools. ?Regularly check to see if handrails are loose or broken. Make sure that both sides of any steps have handrails. ?Any raised decks and porches should have guardrails on the edges. ?Have any leaves, snow, or ice cleared regularly. ?Use sand or salt on walking paths during winter. ?Clean up any spills in your garage right away. This includes oil or grease spills. ?What can I do in the bathroom? ?Use night lights. ?Install grab bars by the toilet and in the tub and shower. Do not use towel bars as grab bars. ?Use non-skid mats or decals in the tub or shower. ?If you need to sit down in the shower, use a plastic, non-slip stool. ?Keep the floor dry. Clean up any water that spills  on the floor as soon as it happens. ?Remove soap buildup in the tub or shower regularly. ?Attach bath mats securely with double-sided non-slip rug tape. ?Do not have throw rugs and other things on the floor that can make you trip. ?What can I do in the bedroom? ?Use night lights. ?Make sure that you have a light by your bed that is easy to reach. ?Do not use any sheets or blankets that are too big for your bed. They should not hang down onto the floor. ?Have a firm chair that has side arms. You can use this for support while you get dressed. ?Do not have throw rugs and other things on the floor that can make you trip. ?What can I do in the kitchen? ?Clean up any spills right away. ?Avoid walking on wet floors. ?Keep items that you use a lot in easy-to-reach places. ?If you need to reach something above you, use a strong step stool that has a grab bar. ?Keep electrical cords out of the way. ?Do not use floor polish or wax that makes floors slippery. If you must use wax, use non-skid floor wax. ?Do not have throw rugs and other things on the floor that can make you trip. ?What can I do with my stairs? ?Do not leave any items on the stairs. ?Make sure that there are handrails on both sides of the stairs and use them. Fix handrails that are broken or loose.  Make sure that handrails are as long as the stairways. ?Check any carpeting to make sure that it is firmly attached to the stairs. Fix any carpet that is loose or worn. ?Avoid having throw rugs at the top or bottom of the stairs. If you do have throw rugs, attach them to the floor with carpet tape. ?Make sure that you have a light switch at the top of the stairs and the bottom of the stairs. If you do not have them, ask someone to add them for you. ?What else can I do to help prevent falls? ?Wear shoes that: ?Do not have high heels. ?Have rubber bottoms. ?Are comfortable and fit you well. ?Are closed at the toe. Do not wear sandals. ?If you use a stepladder: ?Make  sure that it is fully opened. Do not climb a closed stepladder. ?Make sure that both sides of the stepladder are locked into place. ?Ask someone to hold it for you, if possible. ?Clearly mark and make sur

## 2022-04-06 NOTE — Progress Notes (Signed)
? ?Subjective:  ? Rita Wells is a 88 y.o. female who presents for Medicare Annual (Subsequent) preventive examination. ?Virtual Visit via Telephone Note ? ?I connected with  Rita Wells on 04/06/22 at  1:45 PM EDT by telephone and verified that I am speaking with the correct person using two identifiers. ? ?Location: ?Patient: HOME ?Provider: [facility] ?Persons participating in the virtual visit: patient/Nurse Health Advisor ?  ?I discussed the limitations, risks, security and privacy concerns of performing an evaluation and management service by telephone and the availability of in person appointments. The patient expressed understanding and agreed to proceed. ? ?Interactive audio and video telecommunications were attempted between this nurse and patient, however failed, due to patient having technical difficulties OR patient did not have access to video capability.  We continued and completed visit with audio only. ? ?Some vital signs may be absent or patient reported.  ? ?Chriss Driver, LPN ? ?Review of Systems    ? ?Cardiac Risk Factors include: advanced age (>39mn, >>63women);hypertension;dyslipidemia;sedentary lifestyle ? ?   ?Objective:  ?  ?Today's Vitals  ? 04/06/22 1349  ?Weight: 135 lb (61.2 kg)  ?Height: '5\' 3"'$  (1.6 m)  ? ?Body mass index is 23.91 kg/m?. ? ? ?  04/06/2022  ?  1:59 PM 04/03/2021  ?  2:05 PM 04/18/2018  ?  2:58 PM 04/15/2017  ? 12:05 PM 04/01/2017  ? 10:39 AM  ?Advanced Directives  ?Does Patient Have a Medical Advance Directive? No Yes Yes No No  ?Type of [place] will Living will    ?Does patient want to make changes to medical advance directive?   No - Patient declined    ?Copy of [place] Chart?  No - copy requested     ?Would patient like information on creating a medical advance directive? No - Patient declined    No - Patient declined  ? ? ?Current Medications (verified) ?Outpatient Encounter Medications as of  04/06/2022  ?Medication Sig  ? cetirizine (ZYRTEC ALLERGY) 10 MG tablet Take 1 tablet (10 mg total) by mouth daily.  ? levothyroxine (SYNTHROID) 75 MCG tablet Take 1 tablet (75 mcg total) by mouth daily.  ? metoprolol succinate (TOPROL-XL) 100 MG 24 hr tablet TAKE ONE TABLET ONCE DAILY WITH A MEAL  ? rosuvastatin (CRESTOR) 20 MG tablet Take 1 tablet (20 mg total) by mouth daily.  ? ?No facility-administered encounter medications on file as of 04/06/2022.  ? ? ?Allergies (verified) ?Azor [amlodipine-olmesartan]  ? ?History: ?Past Medical History:  ?Diagnosis Date  ? Hyperlipidemia   ? Hypertension   ? Toxic goiter   ? ?Past Surgical History:  ?Procedure Laterality Date  ? CERVICAL FUSION    ? TUBAL LIGATION    ? ?Family History  ?Problem Relation Age of Onset  ? Heart disease Mother   ?     MI  ? Heart attack Mother   ? CVA Father   ? Stroke Father   ? Cancer Sister   ?     OVARIAN  ? Heart disease Brother   ?     MI  ? Heart attack Brother   ? Cancer Brother   ? Heart disease Brother   ?     MI  ? Cancer Sister   ? Heart disease Sister   ? ?Social History  ? ?Socioeconomic History  ? Marital status: Widowed  ?  Spouse name: Not on file  ?  Number of children: 7  ? Years of education: Not on file  ? Highest education level: 10th grade  ?Occupational History  ? Occupation: Set designer  ?  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 Occupation: Tobacco Farming  ?Tobacco Use  ? Smoking status: Former  ?  Types: Cigarettes  ?  Quit date: 12/28/1978  ?  Years since quitting: 43.3  ? Smokeless tobacco: Never  ?Vaping Use  ? Vaping Use: Never used  ?Substance and Sexual Activity  ? Alcohol use: No  ? Drug use: No  ? Sexual activity: Not on file  ?Other Topics Concern  ? Not on file  ?Social History Narrative  ? Lives alone, but grandson stays a lot, and her brother lives next door. 04/03/21  ? Son lives next door, dx with ALS.   ? ?Social Determinants of Health  ? ?Financial Resource Strain: Low Risk   ? Difficulty of Paying Living Expenses:  Not hard at all  ?Food Insecurity: No Food Insecurity  ? Worried About Charity fundraiser in the Last Year: Never true  ? Ran Out of Food in the Last Year: Never true  ?Transportation Needs: No Transportation Needs  ? Lack of Transportation (Medical): No  ? Lack of Transportation (Non-Medical): No  ?Physical Activity: Insufficiently Active  ? Days of Exercise per Week: 3 days  ? Minutes of Exercise per Session: 30 min  ?Stress: No Stress Concern Present  ? Feeling of Stress : Not at all  ?Social Connections: Moderately Integrated  ? Frequency of Communication with Friends and Family: More than three times a week  ? Frequency of Social Gatherings with Friends and Family: More than three times a week  ? Attends Religious Services: More than 4 times per year  ? Active Member of Clubs or Organizations: Yes  ? Attends Archivist Meetings: More than 4 times per year  ? Marital Status: Widowed  ? ? ?Tobacco Counseling ?Counseling given: Not Answered ? ? ?Clinical Intake: ? ?Pre-visit preparation completed: Yes ? ?Pain : No/denies pain ? ?  ? ?BMI - recorded: 23.91 ?Nutritional Status: BMI of 19-24  Normal ?Nutritional Risks: None ?Diabetes: No ? ?How often do you need to have someone help you when you read instructions, pamphlets, or other written materials from your doctor or pharmacy?: 1 - Never ? ?Diabetic?no ? ?Interpreter Needed?: No ? ?Information entered by :: mj Abreanna Drawdy, lpn ? ? ?Activities of Daily Living ? ?  04/06/2022  ?  2:00 PM  ?In your present state of health, do you have any difficulty performing the following activities:  ?Hearing? 0  ?Vision? 0  ?Difficulty concentrating or making decisions? 0  ?Walking or climbing stairs? 0  ?Dressing or bathing? 0  ?Doing errands, shopping? 0  ?Preparing Food and eating ? N  ?Using the Toilet? N  ?In the past six months, have you accidently leaked urine? Y  ?Comment Wears pads.  ?Do you have problems with loss of bowel control? N  ?Managing your Medications? N   ?Managing your Finances? N  ?Housekeeping or managing your Housekeeping? N  ? ? ?Patient Care Team: ?Sharion Balloon, FNP as PCP - General (Family Medicine) ?Marybelle Killings, MD as Consulting Physician (Orthopedic Surgery) ?Jacelyn Pi, MD as Consulting Physician (Endocrinology) ? ?Indicate any recent Medical Services you may have received from other than [facility] providers in the past year (date may be approximate). ? ?   ?Assessment:  ? This is a routine wellness examination  for Rita Wells. ? ?Hearing/Vision screen ?Hearing Screening - Comments:: No hearing issues.  ?Vision Screening - Comments:: Glasses. [employer] [place] 2022. ? ?Dietary issues and exercise activities discussed: ?Current Exercise Habits: Home exercise routine, Type of exercise: walking, Time (Minutes): 30, Frequency (Times/Week): 3, Weekly Exercise (Minutes/Week): 90, Intensity: Mild, Exercise limited by: cardiac condition(s);orthopedic condition(s) ? ? Goals Addressed   ? ?  ?  ?  ?  ? This Visit's Progress  ?  Exercise 150 min/wk Moderate Activity   On track  ?  Have 3 meals a day   On track  ? ?  ? ?Depression Screen ? ?  04/06/2022  ?  1:55 PM 10/20/2021  ?  9:56 AM 04/22/2021  ?  9:17 AM 04/03/2021  ?  2:05 PM 10/31/2020  ? 12:10 PM 10/03/2020  ? 10:37 AM 07/02/2020  ? 10:29 AM  ?PHQ 2/9 Scores  ?PHQ - 2 Score 0 0 0 0 0 0 0  ?  ?Fall Risk ? ?  04/06/2022  ?  1:59 PM 10/20/2021  ?  9:56 AM 04/22/2021  ?  9:17 AM 04/03/2021  ?  2:01 PM 10/31/2020  ? 12:10 PM  ?Fall Risk   ?Falls in the past year? 0 0 0 0 0  ?Number falls in past yr: 0  0 0   ?Injury with Fall? 0  0 0   ?Risk for fall due to : Impaired balance/gait   No Fall Risks   ?Follow up Falls prevention discussed   Falls prevention discussed   ? ? ?FALL RISK PREVENTION PERTAINING TO THE HOME: ? ?Any stairs in or around the home? Yes  ?If so, are there any without handrails? No  ?Home free of loose throw rugs in walkways, pet beds, electrical cords, etc? Yes  ?Adequate lighting in your home to reduce risk  of falls? Yes  ? ?ASSISTIVE DEVICES UTILIZED TO PREVENT FALLS: ? ?Life alert? No  ?Use of a cane, walker or w/c? No  ?Grab bars in the bathroom? No  ?Shower chair or bench in shower? Yes  ?Elevated toi

## 2022-04-20 ENCOUNTER — Ambulatory Visit: Payer: Medicare HMO | Admitting: Family

## 2022-04-22 ENCOUNTER — Encounter: Payer: Self-pay | Admitting: Family

## 2022-05-11 ENCOUNTER — Ambulatory Visit: Payer: Medicare HMO | Admitting: Family

## 2022-05-28 ENCOUNTER — Encounter: Payer: Self-pay | Admitting: Family

## 2022-05-28 ENCOUNTER — Ambulatory Visit (INDEPENDENT_AMBULATORY_CARE_PROVIDER_SITE_OTHER): Payer: Medicare HMO | Admitting: Family

## 2022-05-28 VITALS — BP 153/91 | HR 59 | Temp 97.8°F | Ht 62.0 in | Wt 137.0 lb

## 2022-05-28 DIAGNOSIS — Z0001 Encounter for general adult medical examination with abnormal findings: Secondary | ICD-10-CM | POA: Diagnosis not present

## 2022-05-28 DIAGNOSIS — J449 Chronic obstructive pulmonary disease, unspecified: Secondary | ICD-10-CM

## 2022-05-28 DIAGNOSIS — E039 Hypothyroidism, unspecified: Secondary | ICD-10-CM | POA: Diagnosis not present

## 2022-05-28 DIAGNOSIS — I10 Essential (primary) hypertension: Secondary | ICD-10-CM | POA: Diagnosis not present

## 2022-05-28 DIAGNOSIS — G4700 Insomnia, unspecified: Secondary | ICD-10-CM

## 2022-05-28 DIAGNOSIS — E7849 Other hyperlipidemia: Secondary | ICD-10-CM

## 2022-05-28 DIAGNOSIS — F411 Generalized anxiety disorder: Secondary | ICD-10-CM | POA: Diagnosis not present

## 2022-05-28 DIAGNOSIS — Z0000 Encounter for general adult medical examination without abnormal findings: Secondary | ICD-10-CM

## 2022-05-28 MED ORDER — LISINOPRIL 5 MG PO TABS: 5.0000 mg | ORAL_TABLET | Freq: Every day | ORAL | 3 refills | Status: DC

## 2022-05-28 NOTE — Patient Instructions (Signed)
Hypertension, Adult High blood pressure (hypertension) is when the force of blood pumping through the arteries is too strong. The arteries are the blood vessels that carry blood from the heart throughout the body. Hypertension forces the heart to work harder to pump blood and may cause arteries to become narrow or stiff. Untreated or uncontrolled hypertension can lead to a heart attack, heart failure, a stroke, kidney disease, and other problems. A blood pressure reading consists of a higher number over a lower number. Ideally, your blood pressure should be below 120/80. The first ("top") number is called the systolic pressure. It is a measure of the pressure in your arteries as your heart beats. The second ("bottom") number is called the diastolic pressure. It is a measure of the pressure in your arteries as the heart relaxes. What are the causes? The exact cause of this condition is not known. There are some conditions that result in high blood pressure. What increases the risk? Certain factors may make you more likely to develop high blood pressure. Some of these risk factors are under your control, including: Smoking. Not getting enough exercise or physical activity. Being overweight. Having too much fat, sugar, calories, or salt (sodium) in your diet. Drinking too much alcohol. Other risk factors include: Having a personal history of heart disease, diabetes, high cholesterol, or kidney disease. Stress. Having a family history of high blood pressure and high cholesterol. Having obstructive sleep apnea. Age. The risk increases with age. What are the signs or symptoms? High blood pressure may not cause symptoms. Very high blood pressure (hypertensive crisis) may cause: Headache. Fast or irregular heartbeats (palpitations). Shortness of breath. Nosebleed. Nausea and vomiting. Vision changes. Severe chest pain, dizziness, and seizures. How is this diagnosed? This condition is diagnosed by  measuring your blood pressure while you are seated, with your arm resting on a flat surface, your legs uncrossed, and your feet flat on the floor. The cuff of the blood pressure monitor will be placed directly against the skin of your upper arm at the level of your heart. Blood pressure should be measured at least twice using the same arm. Certain conditions can cause a difference in blood pressure between your right and left arms. If you have a high blood pressure reading during one visit or you have normal blood pressure with other risk factors, you may be asked to: Return on a different day to have your blood pressure checked again. Monitor your blood pressure at home for 1 week or longer. If you are diagnosed with hypertension, you may have other blood or imaging tests to help your health care provider understand your overall risk for other conditions. How is this treated? This condition is treated by making healthy lifestyle changes, such as eating healthy foods, exercising more, and reducing your alcohol intake. You may be referred for counseling on a healthy diet and physical activity. Your health care provider may prescribe medicine if lifestyle changes are not enough to get your blood pressure under control and if: Your systolic blood pressure is above 130. Your diastolic blood pressure is above 80. Your personal target blood pressure may vary depending on your medical conditions, your age, and other factors. Follow these instructions at home: Eating and drinking  Eat a diet that is high in fiber and potassium, and low in sodium, added sugar, and fat. An example of this eating plan is called the DASH diet. DASH stands for Dietary Approaches to Stop Hypertension. To eat this way: Eat   plenty of fresh fruits and vegetables. Try to fill one half of your plate at each meal with fruits and vegetables. Eat whole grains, such as whole-wheat pasta, brown rice, or whole-grain bread. Fill about one  fourth of your plate with whole grains. Eat or drink low-fat dairy products, such as skim milk or low-fat yogurt. Avoid fatty cuts of meat, processed or cured meats, and poultry with skin. Fill about one fourth of your plate with lean proteins, such as fish, chicken without skin, beans, eggs, or tofu. Avoid pre-made and processed foods. These tend to be higher in sodium, added sugar, and fat. Reduce your daily sodium intake. Many people with hypertension should eat less than 1,500 mg of sodium a day. Do not drink alcohol if: Your health care provider tells you not to drink. You are pregnant, may be pregnant, or are planning to become pregnant. If you drink alcohol: Limit how much you have to: 0-1 drink a day for women. 0-2 drinks a day for men. Know how much alcohol is in your drink. In the [place] one drink equals one 12 oz bottle of beer (355 mL), one 5 oz glass of wine (148 mL), or one 1 oz glass of hard liquor (44 mL). Lifestyle  Work with your health care provider to maintain a healthy body weight or to lose weight. Ask what an ideal weight is for you. Get at least 30 minutes of exercise that causes your heart to beat faster (aerobic exercise) most days of the week. Activities may include walking, swimming, or biking. Include exercise to strengthen your muscles (resistance exercise), such as Pilates or lifting weights, as part of your weekly exercise routine. Try to do these types of exercises for 30 minutes at least 3 days a week. Do not use any products that contain nicotine or tobacco. These products include cigarettes, chewing tobacco, and vaping devices, such as e-cigarettes. If you need help quitting, ask your health care provider. Monitor your blood pressure at home as told by your health care provider. Keep all follow-up visits. This is important. Medicines Take over-the-counter and prescription medicines only as told by your health care provider. Follow directions carefully. Blood  pressure medicines must be taken as prescribed. Do not skip doses of blood pressure medicine. Doing this puts you at risk for problems and can make the medicine less effective. Ask your health care provider about side effects or reactions to medicines that you should watch for. Contact a health care provider if you: Think you are having a reaction to a medicine you are taking. Have headaches that keep coming back (recurring). Feel dizzy. Have swelling in your ankles. Have trouble with your vision. Get help right away if you: Develop a severe headache or confusion. Have unusual weakness or numbness. Feel faint. Have severe pain in your chest or abdomen. Vomit repeatedly. Have trouble breathing. These symptoms may be an emergency. Get help right away. Call 911. Do not wait to see if the symptoms will go away. Do not drive yourself to the hospital. Summary Hypertension is when the force of blood pumping through your arteries is too strong. If this condition is not controlled, it may put you at risk for serious complications. Your personal target blood pressure may vary depending on your medical conditions, your age, and other factors. For most people, a normal blood pressure is less than 120/80. Hypertension is treated with lifestyle changes, medicines, or a combination of both. Lifestyle changes include losing weight, eating a healthy,   low-sodium diet, exercising more, and limiting alcohol. This information is not intended to replace advice given to you by your health care provider. Make sure you discuss any questions you have with your health care provider. Document Revised: 10/21/2021 Document Reviewed: 10/21/2021 Elsevier Patient Education  2023 [employer]  

## 2022-05-28 NOTE — Progress Notes (Signed)
Subjective:    Patient ID: Rita Wells, female    DOB: 1933/02/28, 88 y.o.   MRN: [id number]  Chief Complaint  Patient presents with   Follow-up    6 month   Pt presents to the office today for CPE and chronic follow up. She has COPD, but states this is stable. Quit smoking approx 2012.  Hypertension This is a chronic problem. The current episode started more than 1 year ago. The problem has been waxing and waning since onset. The problem is uncontrolled. Associated symptoms include anxiety and malaise/fatigue. Pertinent negatives include no peripheral edema or shortness of breath. Risk factors for coronary artery disease include dyslipidemia, diabetes mellitus and sedentary lifestyle. The current treatment provides moderate improvement. Identifiable causes of hypertension include a thyroid problem.  Thyroid Problem Presents for follow-up visit. Symptoms include anxiety, constipation, fatigue and hoarse voice. Patient reports no cold intolerance or depressed mood. The symptoms have been stable. Her past medical history is significant for hyperlipidemia.  Insomnia Primary symptoms: difficulty falling asleep, frequent awakening, malaise/fatigue.   The current episode started more than one year. The onset quality is gradual. The problem occurs intermittently.  Hyperlipidemia This is a chronic problem. The current episode started more than 1 year ago. The problem is uncontrolled. Pertinent negatives include no shortness of breath. Current antihyperlipidemic treatment includes diet change and statins. The current treatment provides moderate improvement of lipids. Risk factors for coronary artery disease include dyslipidemia and hypertension.  Anxiety Presents for follow-up visit. Symptoms include excessive worry, insomnia, irritability and nervous/anxious behavior. Patient reports no depressed mood or shortness of breath. Symptoms occur rarely. The severity of symptoms is moderate.        Review of Systems  Constitutional:  Positive for fatigue, irritability and malaise/fatigue.  HENT:  Positive for hoarse voice.   Respiratory:  Negative for shortness of breath.   Gastrointestinal:  Positive for constipation.  Endocrine: Negative for cold intolerance.  Psychiatric/Behavioral:  The patient is nervous/anxious and has insomnia.   All other systems reviewed and are negative.  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 04/03/21   Son lives next door, dx with ALS.    Social Determinants of Health   Financial Resource Strain: Low Risk    Difficulty of Paying Living Expenses: Not hard at all  Food Insecurity: No Food Insecurity   Worried About Charity fundraiser in the Last Year: Never true   [place] in the Last Year: Never true  Transportation Needs: No Transportation Needs   Lack of Transportation (Medical): No   Lack of Transportation (Non-Medical): No  Physical  Activity: Insufficiently Active   Days of Exercise per Week: 3 days   Minutes of Exercise per Session: 30 min  Stress: No Stress Concern Present   Feeling of Stress : Not at all  Social Connections: Moderately Integrated   Frequency of Communication with Friends and Family: More than three times a week   Frequency of Social Gatherings with Friends and Family: More than three times a week   Attends Religious Services: More than 4 times per year   Active Member of [employer] or Organizations: Yes   Attends Archivist Meetings: More than 4 times per year   Marital Status: Widowed       Objective:   Physical Exam Vitals reviewed.  Constitutional:      General: She is not in acute distress.    Appearance: She is well-developed.  HENT:     Head: Normocephalic and atraumatic.     Comments: Hoarse voice    Right Ear: Tympanic membrane normal.     Left Ear: Tympanic membrane normal.  Eyes:     Pupils: Pupils are equal, round, and reactive to light.  Neck:     Thyroid: No thyromegaly.  Cardiovascular:     Rate and Rhythm: Normal rate and regular rhythm.     Heart sounds: Murmur heard.  Pulmonary:     Effort: Pulmonary effort is normal. No respiratory distress.     Breath sounds: Normal breath sounds. No wheezing.  Abdominal:     General: Bowel sounds are normal. There is no distension.     Palpations: Abdomen is soft.     Tenderness: There is no abdominal tenderness.  Musculoskeletal:        General: No tenderness. Normal range of motion.     Cervical back: Normal range of motion and neck supple.  Skin:    General: Skin is warm and dry.  Neurological:     Mental Status: She is alert and oriented to person, place, and time.     Cranial Nerves: No cranial nerve deficit.     Deep Tendon Reflexes: Reflexes are normal and symmetric.  Psychiatric:        Behavior: Behavior normal.        Thought Content: Thought content normal.        Judgment: Judgment normal.      BP  (!) 161/93   Pulse (!) 59   Temp 97.8 F (36.6 C)   Ht 5' 2"  (1.575 m)   Wt 137 lb (62.1 kg)   SpO2 98%   BMI 25.06 kg/m      Assessment & Plan:  Fallen Crisostomo Magos comes in today with chief complaint of Follow-up (6 month)   Diagnosis and orders addressed:  1. Primary hypertension Will add Lisinopril 5 mg today Continue metoprolol  -Dash diet information given -Exercise encouraged - Stress Management  -Continue current meds -RTO in 2 weeks  - CMP14+EGFR - CBC with Differential/Platelet -  lisinopril (ZESTRIL) 5 MG tablet; Take 1 tablet (5 mg total) by mouth daily.  Dispense: 90 tablet; Refill: 3  2. Chronic obstructive pulmonary disease, unspecified COPD type ([place]) - CMP14+EGFR - CBC with Differential/Platelet  3. Hypothyroidism, unspecified type - CMP14+EGFR - CBC with Differential/Platelet - TSH  4. Insomnia, unspecified type - CMP14+EGFR - CBC with Differential/Platelet  5. Other hyperlipidemia - CMP14+EGFR - CBC with Differential/Platelet - Lipid panel  6. Generalized anxiety disorder  - CMP14+EGFR - CBC with Differential/Platelet  7. Annual physical exam - CMP14+EGFR - CBC with Differential/Platelet - Lipid panel - TSH   Labs pending Health Maintenance reviewed- Refuses all vaccines Diet and exercise encouraged  Follow up plan: 2 weeks to recheck HTN  Evelina Dun, FNP

## 2022-05-29 ENCOUNTER — Other Ambulatory Visit: Payer: Self-pay | Admitting: Family

## 2022-05-29 ENCOUNTER — Other Ambulatory Visit: Payer: Self-pay | Admitting: *Deleted

## 2022-05-29 DIAGNOSIS — N289 Disorder of kidney and ureter, unspecified: Secondary | ICD-10-CM

## 2022-05-29 LAB — CBC WITH DIFFERENTIAL/PLATELET
Basophils Absolute: 0 10*3/uL (ref 0.0–0.2)
Basos: 1 %
EOS (ABSOLUTE): 0.5 10*3/uL — ABNORMAL HIGH (ref 0.0–0.4)
Eos: 6 %
Hematocrit: 40.7 % (ref 34.0–46.6)
Hemoglobin: 13.2 g/dL (ref 11.1–15.9)
Immature Grans (Abs): 0 10*3/uL (ref 0.0–0.1)
Immature Granulocytes: 0 %
Lymphocytes Absolute: 1.6 10*3/uL (ref 0.7–3.1)
Lymphs: 21 %
MCH: 27.2 pg (ref 26.6–33.0)
MCHC: 32.4 g/dL (ref 31.5–35.7)
MCV: 84 fL (ref 79–97)
Monocytes Absolute: 0.6 10*3/uL (ref 0.1–0.9)
Monocytes: 8 %
Neutrophils Absolute: 4.9 10*3/uL (ref 1.4–7.0)
Neutrophils: 64 %
Platelets: 211 10*3/uL (ref 150–450)
RBC: 4.86 x10E6/uL (ref 3.77–5.28)
RDW: 13.7 % (ref 11.7–15.4)
WBC: 7.7 10*3/uL (ref 3.4–10.8)

## 2022-05-29 LAB — CMP14+EGFR
ALT: 16 IU/L (ref 0–32)
AST: 24 IU/L (ref 0–40)
Albumin/Globulin Ratio: 1.9 (ref 1.2–2.2)
Albumin: 4.2 g/dL (ref 3.6–4.6)
Alkaline Phosphatase: 87 IU/L (ref 44–121)
BUN/Creatinine Ratio: 11 — ABNORMAL LOW (ref 12–28)
BUN: 21 mg/dL (ref 8–27)
Bilirubin Total: 0.6 mg/dL (ref 0.0–1.2)
CO2: 25 mmol/L (ref 20–29)
Calcium: 9.8 mg/dL (ref 8.7–10.3)
Chloride: 97 mmol/L (ref 96–106)
Creatinine, Ser: 1.93 mg/dL — ABNORMAL HIGH (ref 0.57–1.00)
Globulin, Total: 2.2 g/dL (ref 1.5–4.5)
Glucose: 91 mg/dL (ref 70–99)
Potassium: 4.1 mmol/L (ref 3.5–5.2)
Sodium: 135 mmol/L (ref 134–144)
Total Protein: 6.4 g/dL (ref 6.0–8.5)
eGFR: 25 mL/min/{1.73_m2} — ABNORMAL LOW (ref >59)

## 2022-05-29 LAB — LIPID PANEL
Chol/HDL Ratio: 2.7 ratio (ref 0.0–4.4)
Cholesterol, Total: 164 mg/dL (ref 100–199)
HDL: 61 mg/dL (ref >39)
LDL Chol Calc (NIH): 77 mg/dL (ref 0–99)
Triglycerides: 154 mg/dL — ABNORMAL HIGH (ref 0–149)
VLDL Cholesterol Cal: 26 mg/dL (ref 5–40)

## 2022-05-29 LAB — TSH: TSH: 5.53 u[IU]/mL — ABNORMAL HIGH (ref 0.450–4.500)

## 2022-05-29 MED ORDER — LEVOTHYROXINE SODIUM 88 MCG PO TABS: 88.0000 ug | ORAL_TABLET | Freq: Every day | ORAL | 11 refills | Status: DC

## 2022-06-08 ENCOUNTER — Other Ambulatory Visit: Payer: Self-pay | Admitting: Family

## 2022-06-08 DIAGNOSIS — I10 Essential (primary) hypertension: Secondary | ICD-10-CM

## 2022-06-23 ENCOUNTER — Encounter: Payer: Self-pay | Admitting: Family

## 2022-06-23 ENCOUNTER — Ambulatory Visit (INDEPENDENT_AMBULATORY_CARE_PROVIDER_SITE_OTHER): Payer: Medicare HMO | Admitting: Family

## 2022-06-23 VITALS — BP 142/89 | HR 64 | Temp 98.2°F | Ht 62.0 in | Wt 136.2 lb

## 2022-06-23 DIAGNOSIS — N184 Chronic kidney disease, stage 4 (severe): Secondary | ICD-10-CM

## 2022-06-23 DIAGNOSIS — I10 Essential (primary) hypertension: Secondary | ICD-10-CM

## 2022-06-23 DIAGNOSIS — E039 Hypothyroidism, unspecified: Secondary | ICD-10-CM | POA: Diagnosis not present

## 2022-06-23 LAB — BMP8+EGFR
BUN/Creatinine Ratio: 14 (ref 12–28)
BUN: 26 mg/dL (ref 8–27)
CO2: 24 mmol/L (ref 20–29)
Calcium: 9.9 mg/dL (ref 8.7–10.3)
Chloride: 98 mmol/L (ref 96–106)
Creatinine, Ser: 1.86 mg/dL — ABNORMAL HIGH (ref 0.57–1.00)
Glucose: 92 mg/dL (ref 70–99)
Potassium: 4 mmol/L (ref 3.5–5.2)
Sodium: 136 mmol/L (ref 134–144)
eGFR: 26 mL/min/{1.73_m2} — ABNORMAL LOW (ref >59)

## 2022-06-23 NOTE — Progress Notes (Signed)
Subjective:    Patient ID: Rita Wells, female    DOB: April 24, 1933, 88 y.o.   MRN: [id number]  Chief Complaint  Patient presents with   Medical Management of Chronic Issues    Followup    PT presents to the office today to recheck HTN. She was seen on 05/28/22 and we started her on lisinopril 5 mg.   She also had elevated creatinine that needs to be rechecked. She has CKD. She avoids NSAIDS.   Her TSH was also abnormal.  Hypertension This is a chronic problem. The current episode started more than 1 year ago. The problem has been waxing and waning since onset. The problem is uncontrolled. Pertinent negatives include no malaise/fatigue, peripheral edema or shortness of breath. Risk factors for coronary artery disease include dyslipidemia, sedentary lifestyle and smoking/tobacco exposure. The current treatment provides moderate improvement. There is no history of CVA or heart failure. Identifiable causes of hypertension include a thyroid problem.  Thyroid Problem Presents for follow-up visit. Symptoms include hoarse voice. Patient reports no constipation, fatigue or tremors. The symptoms have been stable. There is no history of heart failure.      Review of Systems  Constitutional:  Negative for fatigue and malaise/fatigue.  HENT:  Positive for hoarse voice.   Respiratory:  Negative for shortness of breath.   Gastrointestinal:  Negative for constipation.  Neurological:  Negative for tremors.  All other systems reviewed and are negative.      Objective:   Physical Exam Vitals reviewed.  Constitutional:      General: She is not in acute distress.    Appearance: She is well-developed.  HENT:     Head: Normocephalic and atraumatic.  Eyes:     Pupils: Pupils are equal, round, and reactive to light.  Neck:     Thyroid: No thyromegaly.  Cardiovascular:     Rate and Rhythm: Normal rate and regular rhythm.     Heart sounds: Normal heart sounds. No murmur heard. Pulmonary:      Effort: Pulmonary effort is normal. No respiratory distress.     Breath sounds: Normal breath sounds. No wheezing.  Abdominal:     General: Bowel sounds are normal. There is no distension.     Palpations: Abdomen is soft.     Tenderness: There is no abdominal tenderness.  Musculoskeletal:        General: No tenderness. Normal range of motion.     Cervical back: Normal range of motion and neck supple.  Skin:    General: Skin is warm and dry.  Neurological:     Mental Status: She is alert and oriented to person, place, and time.     Cranial Nerves: No cranial nerve deficit.     Deep Tendon Reflexes: Reflexes are normal and symmetric.  Psychiatric:        Behavior: Behavior normal.        Thought Content: Thought content normal.        Judgment: Judgment normal.     BP (!) 152/85   Pulse 64   Temp 98.2 F (36.8 C)   Ht 5\' 2"  (1.575 m)   Wt 136 lb 3.2 oz (61.8 kg)   SpO2 97%   BMI 24.91 kg/m      Assessment & Plan:  Rita Wells comes in today with chief complaint of Medical Management of Chronic Issues (Followup )   Diagnosis and orders addressed:  1. Primary hypertension - BMP8+EGFR  2. Stage 4 chronic  kidney disease (HCC) - BMP8+EGFR  3. Hypothyroidism, unspecified type - BMP8+EGFR   Labs pending Health Maintenance reviewed Diet and exercise encouraged  Follow up plan: 2 months to follow up on thyroid    Jannifer Rodney, FNP

## 2022-06-25 ENCOUNTER — Telehealth: Payer: Self-pay | Admitting: Family

## 2022-06-25 NOTE — Telephone Encounter (Signed)
Spoke with patient's daughter regarding lab results. 

## 2022-07-07 ENCOUNTER — Other Ambulatory Visit: Payer: Self-pay | Admitting: Family

## 2022-07-07 DIAGNOSIS — E039 Hypothyroidism, unspecified: Secondary | ICD-10-CM

## 2022-08-24 ENCOUNTER — Ambulatory Visit (INDEPENDENT_AMBULATORY_CARE_PROVIDER_SITE_OTHER): Payer: Medicare HMO | Admitting: Family

## 2022-08-24 ENCOUNTER — Encounter: Payer: Self-pay | Admitting: Family

## 2022-08-24 VITALS — BP 155/88 | HR 69 | Temp 97.5°F | Ht 62.0 in | Wt 137.0 lb

## 2022-08-24 DIAGNOSIS — G4700 Insomnia, unspecified: Secondary | ICD-10-CM | POA: Diagnosis not present

## 2022-08-24 DIAGNOSIS — E7849 Other hyperlipidemia: Secondary | ICD-10-CM | POA: Diagnosis not present

## 2022-08-24 DIAGNOSIS — F411 Generalized anxiety disorder: Secondary | ICD-10-CM

## 2022-08-24 DIAGNOSIS — E039 Hypothyroidism, unspecified: Secondary | ICD-10-CM

## 2022-08-24 DIAGNOSIS — I10 Essential (primary) hypertension: Secondary | ICD-10-CM

## 2022-08-24 DIAGNOSIS — J449 Chronic obstructive pulmonary disease, unspecified: Secondary | ICD-10-CM | POA: Diagnosis not present

## 2022-08-24 DIAGNOSIS — M5134 Other intervertebral disc degeneration, thoracic region: Secondary | ICD-10-CM

## 2022-08-24 NOTE — Progress Notes (Signed)
Subjective:    Patient ID: Rita Wells, female    DOB: [birth year], 88 y.o.   MRN: [id number]  Chief Complaint  Patient presents with   Hypertension   Pt presents to the office today for chronic follow up. She has COPD, but states this is stable. Quit smoking approx 2012.  Hypertension This is a chronic problem. The current episode started more than 1 year ago. The problem has been waxing and waning since onset. The problem is uncontrolled. Associated symptoms include anxiety. Pertinent negatives include no malaise/fatigue, peripheral edema or shortness of breath. Risk factors for coronary artery disease include dyslipidemia and sedentary lifestyle. The current treatment provides moderate improvement. Identifiable causes of hypertension include a thyroid problem.  Thyroid Problem Presents for follow-up visit. Symptoms include anxiety and hoarse voice. Patient reports no constipation, depressed mood, diaphoresis or fatigue. The symptoms have been stable. Her past medical history is significant for hyperlipidemia.  Back Pain This is a chronic problem. The current episode started more than 1 year ago. The problem occurs intermittently. The problem has been waxing and waning since onset. The pain is present in the thoracic spine. The pain is at a severity of 1/10. The pain is mild. She has tried ice and home exercises for the symptoms. The treatment provided mild relief.  Insomnia Primary symptoms: difficulty falling asleep, frequent awakening, no malaise/fatigue.   The current episode started more than one year. The onset quality is gradual. The problem occurs intermittently. Past treatments include meditation. The treatment provided moderate relief.  Hyperlipidemia This is a chronic problem. The current episode started more than 1 year ago. The problem is controlled. Recent lipid tests were reviewed and are normal. Pertinent negatives include no shortness of breath. Current antihyperlipidemic  treatment includes statins. The current treatment provides moderate improvement of lipids. Risk factors for coronary artery disease include dyslipidemia, hypertension, a sedentary lifestyle and post-menopausal.  Anxiety Presents for follow-up visit. Symptoms include insomnia and nervous/anxious behavior. Patient reports no depressed mood, excessive worry, irritability or shortness of breath. Symptoms occur occasionally. The severity of symptoms is mild.        Review of Systems  Constitutional:  Negative for diaphoresis, fatigue, irritability and malaise/fatigue.  HENT:  Positive for hoarse voice.   Respiratory:  Negative for shortness of breath.   Gastrointestinal:  Negative for constipation.  Musculoskeletal:  Positive for back pain.  Psychiatric/Behavioral:  The patient is nervous/anxious and has insomnia.   All other systems reviewed and are negative.      Objective:   Physical Exam Vitals reviewed.  Constitutional:      General: She is not in acute distress.    Appearance: She is well-developed.  HENT:     Head: Normocephalic and atraumatic.     Right Ear: Tympanic membrane normal.     Left Ear: Tympanic membrane normal.  Eyes:     Pupils: Pupils are equal, round, and reactive to light.  Neck:     Thyroid: No thyromegaly.  Cardiovascular:     Rate and Rhythm: Normal rate and regular rhythm.     Heart sounds: Normal heart sounds. No murmur heard. Pulmonary:     Effort: Pulmonary effort is normal. No respiratory distress.     Breath sounds: Normal breath sounds. No wheezing.  Abdominal:     General: Bowel sounds are normal. There is no distension.     Palpations: Abdomen is soft.     Tenderness: There is no abdominal tenderness.  Musculoskeletal:  General: No tenderness. Normal range of motion.     Cervical back: Normal range of motion and neck supple.  Skin:    General: Skin is warm and dry.  Neurological:     Mental Status: She is alert and oriented to  person, place, and time.     Cranial Nerves: No cranial nerve deficit.     Deep Tendon Reflexes: Reflexes are normal and symmetric.  Psychiatric:        Behavior: Behavior normal.        Thought Content: Thought content normal.        Judgment: Judgment normal.       BP (!) 170/87   Pulse 69   Temp (!) 97.5 F (36.4 C) (Temporal)   Ht _0  (1.575 m)   Wt 137 lb (62.1 kg)   SpO2 98%   BMI 25.06 kg/m      Assessment & Plan:  Rita Wells comes in today with chief complaint of Hypertension   Diagnosis and orders addressed:  1. Primary hypertension - CMP14+EGFR  2. Chronic obstructive pulmonary disease, unspecified COPD type ([place]) - CMP14+EGFR  3. Hypothyroidism, unspecified type - CMP14+EGFR - TSH  4. DDD (degenerative disc disease), thoracic - CMP14+EGFR  5. Other hyperlipidemia - CMP14+EGFR  6. Generalized anxiety disorder - CMP14+EGFR  7. Insomnia, unspecified type - CMP14+EGFR   Labs pending Health Maintenance reviewed Diet and exercise encouraged  Follow up plan: 3 months    Evelina Dun, FNP

## 2022-08-24 NOTE — Patient Instructions (Signed)
Hypertension, Adult High blood pressure (hypertension) is when the force of blood pumping through the arteries is too strong. The arteries are the blood vessels that carry blood from the heart throughout the body. Hypertension forces the heart to work harder to pump blood and may cause arteries to become narrow or stiff. Untreated or uncontrolled hypertension can lead to a heart attack, heart failure, a stroke, kidney disease, and other problems. A blood pressure reading consists of a higher number over a lower number. Ideally, your blood pressure should be below 120/80. The first ("top") number is called the systolic pressure. It is a measure of the pressure in your arteries as your heart beats. The second ("bottom") number is called the diastolic pressure. It is a measure of the pressure in your arteries as the heart relaxes. What are the causes? The exact cause of this condition is not known. There are some conditions that result in high blood pressure. What increases the risk? Certain factors may make you more likely to develop high blood pressure. Some of these risk factors are under your control, including: Smoking. Not getting enough exercise or physical activity. Being overweight. Having too much fat, sugar, calories, or salt (sodium) in your diet. Drinking too much alcohol. Other risk factors include: Having a personal history of heart disease, diabetes, high cholesterol, or kidney disease. Stress. Having a family history of high blood pressure and high cholesterol. Having obstructive sleep apnea. Age. The risk increases with age. What are the signs or symptoms? High blood pressure may not cause symptoms. Very high blood pressure (hypertensive crisis) may cause: Headache. Fast or irregular heartbeats (palpitations). Shortness of breath. Nosebleed. Nausea and vomiting. Vision changes. Severe chest pain, dizziness, and seizures. How is this diagnosed? This condition is diagnosed by  measuring your blood pressure while you are seated, with your arm resting on a flat surface, your legs uncrossed, and your feet flat on the floor. The cuff of the blood pressure monitor will be placed directly against the skin of your upper arm at the level of your heart. Blood pressure should be measured at least twice using the same arm. Certain conditions can cause a difference in blood pressure between your right and left arms. If you have a high blood pressure reading during one visit or you have normal blood pressure with other risk factors, you may be asked to: Return on a different day to have your blood pressure checked again. Monitor your blood pressure at home for 1 week or longer. If you are diagnosed with hypertension, you may have other blood or imaging tests to help your health care provider understand your overall risk for other conditions. How is this treated? This condition is treated by making healthy lifestyle changes, such as eating healthy foods, exercising more, and reducing your alcohol intake. You may be referred for counseling on a healthy diet and physical activity. Your health care provider may prescribe medicine if lifestyle changes are not enough to get your blood pressure under control and if: Your systolic blood pressure is above 130. Your diastolic blood pressure is above 80. Your personal target blood pressure may vary depending on your medical conditions, your age, and other factors. Follow these instructions at home: Eating and drinking  Eat a diet that is high in fiber and potassium, and low in sodium, added sugar, and fat. An example of this eating plan is called the DASH diet. DASH stands for Dietary Approaches to Stop Hypertension. To eat this way: Eat   plenty of fresh fruits and vegetables. Try to fill one half of your plate at each meal with fruits and vegetables. Eat whole grains, such as whole-wheat pasta, brown rice, or whole-grain bread. Fill about one  fourth of your plate with whole grains. Eat or drink low-fat dairy products, such as skim milk or low-fat yogurt. Avoid fatty cuts of meat, processed or cured meats, and poultry with skin. Fill about one fourth of your plate with lean proteins, such as fish, chicken without skin, beans, eggs, or tofu. Avoid pre-made and processed foods. These tend to be higher in sodium, added sugar, and fat. Reduce your daily sodium intake. Many people with hypertension should eat less than 1,500 mg of sodium a day. Do not drink alcohol if: Your health care provider tells you not to drink. You are pregnant, may be pregnant, or are planning to become pregnant. If you drink alcohol: Limit how much you have to: 0-1 drink a day for women. 0-2 drinks a day for men. Know how much alcohol is in your drink. In the [place] one drink equals one 12 oz bottle of beer (355 mL), one 5 oz glass of wine (148 mL), or one 1 oz glass of hard liquor (44 mL). Lifestyle  Work with your health care provider to maintain a healthy body weight or to lose weight. Ask what an ideal weight is for you. Get at least 30 minutes of exercise that causes your heart to beat faster (aerobic exercise) most days of the week. Activities may include walking, swimming, or biking. Include exercise to strengthen your muscles (resistance exercise), such as Pilates or lifting weights, as part of your weekly exercise routine. Try to do these types of exercises for 30 minutes at least 3 days a week. Do not use any products that contain nicotine or tobacco. These products include cigarettes, chewing tobacco, and vaping devices, such as e-cigarettes. If you need help quitting, ask your health care provider. Monitor your blood pressure at home as told by your health care provider. Keep all follow-up visits. This is important. Medicines Take over-the-counter and prescription medicines only as told by your health care provider. Follow directions carefully. Blood  pressure medicines must be taken as prescribed. Do not skip doses of blood pressure medicine. Doing this puts you at risk for problems and can make the medicine less effective. Ask your health care provider about side effects or reactions to medicines that you should watch for. Contact a health care provider if you: Think you are having a reaction to a medicine you are taking. Have headaches that keep coming back (recurring). Feel dizzy. Have swelling in your ankles. Have trouble with your vision. Get help right away if you: Develop a severe headache or confusion. Have unusual weakness or numbness. Feel faint. Have severe pain in your chest or abdomen. Vomit repeatedly. Have trouble breathing. These symptoms may be an emergency. Get help right away. Call 911. Do not wait to see if the symptoms will go away. Do not drive yourself to the hospital. Summary Hypertension is when the force of blood pumping through your arteries is too strong. If this condition is not controlled, it may put you at risk for serious complications. Your personal target blood pressure may vary depending on your medical conditions, your age, and other factors. For most people, a normal blood pressure is less than 120/80. Hypertension is treated with lifestyle changes, medicines, or a combination of both. Lifestyle changes include losing weight, eating a healthy,   low-sodium diet, exercising more, and limiting alcohol. This information is not intended to replace advice given to you by your health care provider. Make sure you discuss any questions you have with your health care provider. Document Revised: 10/21/2021 Document Reviewed: 10/21/2021 Elsevier Patient Education  2023 [employer]  

## 2022-08-25 LAB — CMP14+EGFR
ALT: 18 IU/L (ref 0–32)
AST: 19 IU/L (ref 0–40)
Albumin/Globulin Ratio: 1.7 (ref 1.2–2.2)
Albumin: 4 g/dL (ref 3.7–4.7)
Alkaline Phosphatase: 107 IU/L (ref 44–121)
BUN/Creatinine Ratio: 13 (ref 12–28)
BUN: 25 mg/dL (ref 8–27)
Bilirubin Total: 0.5 mg/dL (ref 0.0–1.2)
CO2: 23 mmol/L (ref 20–29)
Calcium: 9.7 mg/dL (ref 8.7–10.3)
Chloride: 100 mmol/L (ref 96–106)
Creatinine, Ser: 1.99 mg/dL — ABNORMAL HIGH (ref 0.57–1.00)
Globulin, Total: 2.4 g/dL (ref 1.5–4.5)
Glucose: 93 mg/dL (ref 70–99)
Potassium: 4.2 mmol/L (ref 3.5–5.2)
Sodium: 138 mmol/L (ref 134–144)
Total Protein: 6.4 g/dL (ref 6.0–8.5)
eGFR: 24 mL/min/{1.73_m2} — ABNORMAL LOW (ref >59)

## 2022-08-25 LAB — TSH: TSH: 1.6 u[IU]/mL (ref 0.450–4.500)

## 2022-09-08 ENCOUNTER — Other Ambulatory Visit: Payer: Self-pay | Admitting: Family

## 2022-09-08 DIAGNOSIS — I10 Essential (primary) hypertension: Secondary | ICD-10-CM

## 2022-11-24 ENCOUNTER — Encounter: Payer: Self-pay | Admitting: Family

## 2022-11-24 ENCOUNTER — Ambulatory Visit (INDEPENDENT_AMBULATORY_CARE_PROVIDER_SITE_OTHER): Payer: Medicare HMO | Admitting: Family

## 2022-11-24 VITALS — BP 188/90 | HR 67 | Temp 97.0°F | Ht 62.0 in | Wt 135.6 lb

## 2022-11-24 DIAGNOSIS — Z23 Encounter for immunization: Secondary | ICD-10-CM | POA: Diagnosis not present

## 2022-11-24 DIAGNOSIS — J449 Chronic obstructive pulmonary disease, unspecified: Secondary | ICD-10-CM

## 2022-11-24 DIAGNOSIS — E039 Hypothyroidism, unspecified: Secondary | ICD-10-CM | POA: Diagnosis not present

## 2022-11-24 DIAGNOSIS — E7849 Other hyperlipidemia: Secondary | ICD-10-CM | POA: Diagnosis not present

## 2022-11-24 DIAGNOSIS — F411 Generalized anxiety disorder: Secondary | ICD-10-CM

## 2022-11-24 DIAGNOSIS — G4700 Insomnia, unspecified: Secondary | ICD-10-CM

## 2022-11-24 DIAGNOSIS — I10 Essential (primary) hypertension: Secondary | ICD-10-CM | POA: Diagnosis not present

## 2022-11-24 MED ORDER — LISINOPRIL 10 MG PO TABS: 10.0000 mg | ORAL_TABLET | Freq: Every day | ORAL | 3 refills | Status: DC

## 2022-11-24 NOTE — Patient Instructions (Signed)
Hypertension, Adult High blood pressure (hypertension) is when the force of blood pumping through the arteries is too strong. The arteries are the blood vessels that carry blood from the heart throughout the body. Hypertension forces the heart to work harder to pump blood and may cause arteries to become narrow or stiff. Untreated or uncontrolled hypertension can lead to a heart attack, heart failure, a stroke, kidney disease, and other problems. A blood pressure reading consists of a higher number over a lower number. Ideally, your blood pressure should be below 120/80. The first ("top") number is called the systolic pressure. It is a measure of the pressure in your arteries as your heart beats. The second ("bottom") number is called the diastolic pressure. It is a measure of the pressure in your arteries as the heart relaxes. What are the causes? The exact cause of this condition is not known. There are some conditions that result in high blood pressure. What increases the risk? Certain factors may make you more likely to develop high blood pressure. Some of these risk factors are under your control, including: Smoking. Not getting enough exercise or physical activity. Being overweight. Having too much fat, sugar, calories, or salt (sodium) in your diet. Drinking too much alcohol. Other risk factors include: Having a personal history of heart disease, diabetes, high cholesterol, or kidney disease. Stress. Having a family history of high blood pressure and high cholesterol. Having obstructive sleep apnea. Age. The risk increases with age. What are the signs or symptoms? High blood pressure may not cause symptoms. Very high blood pressure (hypertensive crisis) may cause: Headache. Fast or irregular heartbeats (palpitations). Shortness of breath. Nosebleed. Nausea and vomiting. Vision changes. Severe chest pain, dizziness, and seizures. How is this diagnosed? This condition is diagnosed by  measuring your blood pressure while you are seated, with your arm resting on a flat surface, your legs uncrossed, and your feet flat on the floor. The cuff of the blood pressure monitor will be placed directly against the skin of your upper arm at the level of your heart. Blood pressure should be measured at least twice using the same arm. Certain conditions can cause a difference in blood pressure between your right and left arms. If you have a high blood pressure reading during one visit or you have normal blood pressure with other risk factors, you may be asked to: Return on a different day to have your blood pressure checked again. Monitor your blood pressure at home for 1 week or longer. If you are diagnosed with hypertension, you may have other blood or imaging tests to help your health care provider understand your overall risk for other conditions. How is this treated? This condition is treated by making healthy lifestyle changes, such as eating healthy foods, exercising more, and reducing your alcohol intake. You may be referred for counseling on a healthy diet and physical activity. Your health care provider may prescribe medicine if lifestyle changes are not enough to get your blood pressure under control and if: Your systolic blood pressure is above 130. Your diastolic blood pressure is above 80. Your personal target blood pressure may vary depending on your medical conditions, your age, and other factors. Follow these instructions at home: Eating and drinking  Eat a diet that is high in fiber and potassium, and low in sodium, added sugar, and fat. An example of this eating plan is called the DASH diet. DASH stands for Dietary Approaches to Stop Hypertension. To eat this way: Eat   plenty of fresh fruits and vegetables. Try to fill one half of your plate at each meal with fruits and vegetables. Eat whole grains, such as whole-wheat pasta, brown rice, or whole-grain bread. Fill about one  fourth of your plate with whole grains. Eat or drink low-fat dairy products, such as skim milk or low-fat yogurt. Avoid fatty cuts of meat, processed or cured meats, and poultry with skin. Fill about one fourth of your plate with lean proteins, such as fish, chicken without skin, beans, eggs, or tofu. Avoid pre-made and processed foods. These tend to be higher in sodium, added sugar, and fat. Reduce your daily sodium intake. Many people with hypertension should eat less than 1,500 mg of sodium a day. Do not drink alcohol if: Your health care provider tells you not to drink. You are pregnant, may be pregnant, or are planning to become pregnant. If you drink alcohol: Limit how much you have to: 0-1 drink a day for women. 0-2 drinks a day for men. Know how much alcohol is in your drink. In the [place] one drink equals one 12 oz bottle of beer (355 mL), one 5 oz glass of wine (148 mL), or one 1 oz glass of hard liquor (44 mL). Lifestyle  Work with your health care provider to maintain a healthy body weight or to lose weight. Ask what an ideal weight is for you. Get at least 30 minutes of exercise that causes your heart to beat faster (aerobic exercise) most days of the week. Activities may include walking, swimming, or biking. Include exercise to strengthen your muscles (resistance exercise), such as Pilates or lifting weights, as part of your weekly exercise routine. Try to do these types of exercises for 30 minutes at least 3 days a week. Do not use any products that contain nicotine or tobacco. These products include cigarettes, chewing tobacco, and vaping devices, such as e-cigarettes. If you need help quitting, ask your health care provider. Monitor your blood pressure at home as told by your health care provider. Keep all follow-up visits. This is important. Medicines Take over-the-counter and prescription medicines only as told by your health care provider. Follow directions carefully. Blood  pressure medicines must be taken as prescribed. Do not skip doses of blood pressure medicine. Doing this puts you at risk for problems and can make the medicine less effective. Ask your health care provider about side effects or reactions to medicines that you should watch for. Contact a health care provider if you: Think you are having a reaction to a medicine you are taking. Have headaches that keep coming back (recurring). Feel dizzy. Have swelling in your ankles. Have trouble with your vision. Get help right away if you: Develop a severe headache or confusion. Have unusual weakness or numbness. Feel faint. Have severe pain in your chest or abdomen. Vomit repeatedly. Have trouble breathing. These symptoms may be an emergency. Get help right away. Call 911. Do not wait to see if the symptoms will go away. Do not drive yourself to the hospital. Summary Hypertension is when the force of blood pumping through your arteries is too strong. If this condition is not controlled, it may put you at risk for serious complications. Your personal target blood pressure may vary depending on your medical conditions, your age, and other factors. For most people, a normal blood pressure is less than 120/80. Hypertension is treated with lifestyle changes, medicines, or a combination of both. Lifestyle changes include losing weight, eating a healthy,   low-sodium diet, exercising more, and limiting alcohol. This information is not intended to replace advice given to you by your health care provider. Make sure you discuss any questions you have with your health care provider. Document Revised: 10/21/2021 Document Reviewed: 10/21/2021 Elsevier Patient Education  2023 [employer]  

## 2022-11-24 NOTE — Progress Notes (Signed)
Subjective:    Patient ID: Rita Wells, female    DOB: 01/03/1933, 88 y.o.   MRN: [id number]  Chief Complaint  Patient presents with   Medical Management of Chronic Issues   Pt presents to the office today for chronic follow up. She has COPD, but states this is stable. Quit smoking approx 2012.   Hypertension This is a chronic problem. The current episode started more than 1 year ago. The problem has been waxing and waning since onset. The problem is uncontrolled. Associated symptoms include anxiety and malaise/fatigue. Pertinent negatives include no peripheral edema or shortness of breath. Risk factors for coronary artery disease include dyslipidemia and sedentary lifestyle. Past treatments include beta blockers and ACE inhibitors. The current treatment provides no improvement. Identifiable causes of hypertension include a thyroid problem.  Thyroid Problem Presents for follow-up visit. Symptoms include anxiety, fatigue and hoarse voice. Patient reports no depressed mood. The symptoms have been stable. Her past medical history is significant for hyperlipidemia.  Hyperlipidemia This is a chronic problem. The current episode started more than 1 year ago. The problem is controlled. Recent lipid tests were reviewed and are normal. Pertinent negatives include no shortness of breath. Current antihyperlipidemic treatment includes statins. The current treatment provides moderate improvement of lipids. Risk factors for coronary artery disease include dyslipidemia, hypertension, a sedentary lifestyle and post-menopausal.  Insomnia Primary symptoms: difficulty falling asleep, frequent awakening, malaise/fatigue.   The current episode started more than one year. The onset quality is gradual. The problem occurs intermittently. Past treatments include medication. The treatment provided moderate relief.  Anxiety Presents for follow-up visit. Symptoms include excessive worry, insomnia and nervous/anxious  behavior. Patient reports no depressed mood, irritability or shortness of breath. Symptoms occur occasionally. The severity of symptoms is moderate.        Review of Systems  Constitutional:  Positive for fatigue and malaise/fatigue. Negative for irritability.  HENT:  Positive for hoarse voice.   Respiratory:  Negative for shortness of breath.   Psychiatric/Behavioral:  The patient is nervous/anxious and has insomnia.   All other systems reviewed and are negative.  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 04/03/21   Son lives next door, dx with ALS.    Social Determinants of Health   Financial Resource Strain: Low Risk  (04/06/2022)   Overall Financial Resource Strain (CARDIA)    Difficulty of Paying Living Expenses: Not hard at all  Food Insecurity: No Food Insecurity (04/06/2022)   Hunger Vital Sign    Worried About Running Out of Food in the Last Year: Never true    Ran Out of Food in  the Last Year: Never true  Transportation Needs: No Transportation Needs (04/06/2022)   PRAPARE - Hydrologist (Medical): No    Lack of Transportation (Non-Medical): No  Physical Activity: Insufficiently Active (04/06/2022)   Exercise Vital Sign    Days of Exercise per Week: 3 days    Minutes of Exercise per Session: 30 min  Stress: No Stress Concern Present (04/06/2022)   [place]    Feeling of Stress : Not at all  Social Connections: Moderately Integrated (04/06/2022)   Social Connection and Isolation Panel [NHANES]    Frequency of Communication with Friends and Family: More than three times a week    Frequency of Social Gatherings with Friends and Family: More than three times a week    Attends Religious Services: More than 4 times per year    Active Member of [employer] or Organizations: Yes    Attends Archivist Meetings: More than 4 times per year    Marital Status: Widowed       Objective:   Physical Exam Vitals reviewed.  Constitutional:      General: She is not in acute distress.    Appearance: She is well-developed.  HENT:     Head: Normocephalic and atraumatic.     Right Ear: Tympanic membrane normal.     Left Ear: Tympanic membrane normal.  Eyes:     Pupils: Pupils are equal, round, and reactive to light.  Neck:     Thyroid: No thyromegaly.  Cardiovascular:     Rate and Rhythm: Normal rate and regular rhythm.     Heart sounds: Normal heart sounds. No murmur heard. Pulmonary:     Effort: Pulmonary effort is normal. No respiratory distress.     Breath sounds: Normal breath sounds. No wheezing.  Abdominal:     General: Bowel sounds are normal. There is no distension.     Palpations: Abdomen is soft.     Tenderness: There is no abdominal tenderness.  Musculoskeletal:        General: No tenderness. Normal range of motion.     Cervical back: Normal range of motion  and neck supple.  Skin:    General: Skin is warm and dry.  Neurological:     Mental Status: She is alert and oriented to person, place, and time.     Cranial Nerves: No cranial nerve deficit.     Deep Tendon Reflexes: Reflexes are normal and symmetric.  Psychiatric:        Behavior: Behavior normal.        Thought Content: Thought content normal.        Judgment: Judgment normal.          BP (!) 188/90   Pulse 67   Temp (!) 97 F (36.1 C) (Temporal)   Ht _0  (1.575 m)   Wt 135 lb 9.6 oz (61.5 kg)  SpO2 98%   BMI 24.80 kg/m   Assessment & Plan:   Sophiea Ueda Newsham comes in today with chief complaint of Medical Management of Chronic Issues   Diagnosis and orders addressed:  1. Need for immunization against influenza - Flu Vaccine QUAD High Dose(Fluad) - CMP14+EGFR - CBC with Differential/Platelet  2. Insomnia, unspecified type - CMP14+EGFR - CBC with Differential/Platelet  3. Chronic obstructive pulmonary disease, unspecified COPD type ([place]) - CMP14+EGFR - CBC with Differential/Platelet  4. Generalized anxiety disorder - CMP14+EGFR - CBC with Differential/Platelet  5. Other hyperlipidemia - CMP14+EGFR - CBC with Differential/Platelet  6. Primary hypertension Start lisinopril 10 mg  -Dash diet information given -Exercise encouraged - Stress Management  -Continue current meds -RTO in 2 weeks  - lisinopril (ZESTRIL) 10 MG tablet; Take 1 tablet (10 mg total) by mouth daily.  Dispense: 90 tablet; Refill: 3 - CMP14+EGFR - CBC with Differential/Platelet  7. Hypothyroidism, unspecified type - CMP14+EGFR - CBC with Differential/Platelet - TSH   Labs pending Health Maintenance reviewed Diet and exercise encouraged  Follow up plan: 2 weeks to recheck HTN   Evelina Dun, FNP

## 2022-11-25 LAB — CBC WITH DIFFERENTIAL/PLATELET
Basophils Absolute: 0 10*3/uL (ref 0.0–0.2)
Basos: 1 %
EOS (ABSOLUTE): 0.5 10*3/uL — ABNORMAL HIGH (ref 0.0–0.4)
Eos: 6 %
Hematocrit: 45.8 % (ref 34.0–46.6)
Hemoglobin: 14.8 g/dL (ref 11.1–15.9)
Immature Grans (Abs): 0 10*3/uL (ref 0.0–0.1)
Immature Granulocytes: 1 %
Lymphocytes Absolute: 1.5 10*3/uL (ref 0.7–3.1)
Lymphs: 17 %
MCH: 27.2 pg (ref 26.6–33.0)
MCHC: 32.3 g/dL (ref 31.5–35.7)
MCV: 84 fL (ref 79–97)
Monocytes Absolute: 0.6 10*3/uL (ref 0.1–0.9)
Monocytes: 7 %
Neutrophils Absolute: 6 10*3/uL (ref 1.4–7.0)
Neutrophils: 68 %
Platelets: 244 10*3/uL (ref 150–450)
RBC: 5.45 x10E6/uL — ABNORMAL HIGH (ref 3.77–5.28)
RDW: 15 % (ref 11.7–15.4)
WBC: 8.7 10*3/uL (ref 3.4–10.8)

## 2022-11-25 LAB — CMP14+EGFR
ALT: 14 IU/L (ref 0–32)
AST: 22 IU/L (ref 0–40)
Albumin/Globulin Ratio: 1.7 (ref 1.2–2.2)
Albumin: 4.3 g/dL (ref 3.7–4.7)
Alkaline Phosphatase: 108 IU/L (ref 44–121)
BUN/Creatinine Ratio: 14 (ref 12–28)
BUN: 29 mg/dL — ABNORMAL HIGH (ref 8–27)
Bilirubin Total: 0.5 mg/dL (ref 0.0–1.2)
CO2: 22 mmol/L (ref 20–29)
Calcium: 9.8 mg/dL (ref 8.7–10.3)
Chloride: 100 mmol/L (ref 96–106)
Creatinine, Ser: 2.07 mg/dL — ABNORMAL HIGH (ref 0.57–1.00)
Globulin, Total: 2.6 g/dL (ref 1.5–4.5)
Glucose: 88 mg/dL (ref 70–99)
Potassium: 3.9 mmol/L (ref 3.5–5.2)
Sodium: 137 mmol/L (ref 134–144)
Total Protein: 6.9 g/dL (ref 6.0–8.5)
eGFR: 23 mL/min/{1.73_m2} — ABNORMAL LOW (ref >59)

## 2022-11-25 LAB — TSH: TSH: 2.03 u[IU]/mL (ref 0.450–4.500)

## 2022-11-26 ENCOUNTER — Other Ambulatory Visit: Payer: Self-pay | Admitting: Family

## 2022-11-26 DIAGNOSIS — N184 Chronic kidney disease, stage 4 (severe): Secondary | ICD-10-CM

## 2022-12-07 ENCOUNTER — Other Ambulatory Visit: Payer: Self-pay | Admitting: Family

## 2022-12-07 DIAGNOSIS — I10 Essential (primary) hypertension: Secondary | ICD-10-CM

## 2022-12-15 ENCOUNTER — Ambulatory Visit: Payer: Medicare HMO | Admitting: Family

## 2023-01-06 ENCOUNTER — Other Ambulatory Visit: Payer: Self-pay | Admitting: Family

## 2023-01-06 DIAGNOSIS — E7849 Other hyperlipidemia: Secondary | ICD-10-CM

## 2023-01-12 ENCOUNTER — Ambulatory Visit: Payer: Medicare HMO | Admitting: Family

## 2023-02-04 ENCOUNTER — Ambulatory Visit: Payer: Medicare HMO | Admitting: Family

## 2023-03-02 ENCOUNTER — Other Ambulatory Visit: Payer: Self-pay | Admitting: Family

## 2023-03-02 DIAGNOSIS — I10 Essential (primary) hypertension: Secondary | ICD-10-CM

## 2023-03-08 ENCOUNTER — Encounter: Payer: Self-pay | Admitting: Family

## 2023-03-08 ENCOUNTER — Ambulatory Visit (INDEPENDENT_AMBULATORY_CARE_PROVIDER_SITE_OTHER): Payer: Medicare HMO | Admitting: Family

## 2023-03-08 VITALS — BP 157/95 | HR 74 | Temp 97.8°F | Ht 62.0 in | Wt 135.6 lb

## 2023-03-08 DIAGNOSIS — G4700 Insomnia, unspecified: Secondary | ICD-10-CM

## 2023-03-08 DIAGNOSIS — F411 Generalized anxiety disorder: Secondary | ICD-10-CM | POA: Diagnosis not present

## 2023-03-08 DIAGNOSIS — E7849 Other hyperlipidemia: Secondary | ICD-10-CM | POA: Diagnosis not present

## 2023-03-08 DIAGNOSIS — E039 Hypothyroidism, unspecified: Secondary | ICD-10-CM

## 2023-03-08 DIAGNOSIS — Z23 Encounter for immunization: Secondary | ICD-10-CM

## 2023-03-08 DIAGNOSIS — I10 Essential (primary) hypertension: Secondary | ICD-10-CM

## 2023-03-08 DIAGNOSIS — J309 Allergic rhinitis, unspecified: Secondary | ICD-10-CM | POA: Diagnosis not present

## 2023-03-08 DIAGNOSIS — J449 Chronic obstructive pulmonary disease, unspecified: Secondary | ICD-10-CM

## 2023-03-08 MED ORDER — ROSUVASTATIN CALCIUM 20 MG PO TABS: 20.0000 mg | ORAL_TABLET | Freq: Every day | ORAL | 0 refills | Status: DC

## 2023-03-08 MED ORDER — METOPROLOL SUCCINATE ER 100 MG PO TB24: ORAL_TABLET | ORAL | 0 refills | Status: DC

## 2023-03-08 MED ORDER — LISINOPRIL 20 MG PO TABS: 20.0000 mg | ORAL_TABLET | Freq: Every day | ORAL | 3 refills | Status: DC

## 2023-03-08 NOTE — Patient Instructions (Addendum)
Hypertension, Adult High blood pressure (hypertension) is when the force of blood pumping through the arteries is too strong. The arteries are the blood vessels that carry blood from the heart throughout the body. Hypertension forces the heart to work harder to pump blood and may cause arteries to become narrow or stiff. Untreated or uncontrolled hypertension can lead to a heart attack, heart failure, a stroke, kidney disease, and other problems. A blood pressure reading consists of a higher number over a lower number. Ideally, your blood pressure should be below 120/80. The first ("top") number is called the systolic pressure. It is a measure of the pressure in your arteries as your heart beats. The second ("bottom") number is called the diastolic pressure. It is a measure of the pressure in your arteries as the heart relaxes. What are the causes? The exact cause of this condition is not known. There are some conditions that result in high blood pressure. What increases the risk? Certain factors may make you more likely to develop high blood pressure. Some of these risk factors are under your control, including: Smoking. Not getting enough exercise or physical activity. Being overweight. Having too much fat, sugar, calories, or salt (sodium) in your diet. Drinking too much alcohol. Other risk factors include: Having a personal history of heart disease, diabetes, high cholesterol, or kidney disease. Stress. Having a family history of high blood pressure and high cholesterol. Having obstructive sleep apnea. Age. The risk increases with age. What are the signs or symptoms? High blood pressure may not cause symptoms. Very high blood pressure (hypertensive crisis) may cause: Headache. Fast or irregular heartbeats (palpitations). Shortness of breath. Nosebleed. Nausea and vomiting. Vision changes. Severe chest pain, dizziness, and seizures. How is this diagnosed? This condition is diagnosed by  measuring your blood pressure while you are seated, with your arm resting on a flat surface, your legs uncrossed, and your feet flat on the floor. The cuff of the blood pressure monitor will be placed directly against the skin of your upper arm at the level of your heart. Blood pressure should be measured at least twice using the same arm. Certain conditions can cause a difference in blood pressure between your right and left arms. If you have a high blood pressure reading during one visit or you have normal blood pressure with other risk factors, you may be asked to: Return on a different day to have your blood pressure checked again. Monitor your blood pressure at home for 1 week or longer. If you are diagnosed with hypertension, you may have other blood or imaging tests to help your health care provider understand your overall risk for other conditions. How is this treated? This condition is treated by making healthy lifestyle changes, such as eating healthy foods, exercising more, and reducing your alcohol intake. You may be referred for counseling on a healthy diet and physical activity. Your health care provider may prescribe medicine if lifestyle changes are not enough to get your blood pressure under control and if: Your systolic blood pressure is above 130. Your diastolic blood pressure is above 80. Your personal target blood pressure may vary depending on your medical conditions, your age, and other factors. Follow these instructions at home: Eating and drinking  Eat a diet that is high in fiber and potassium, and low in sodium, added sugar, and fat. An example of this eating plan is called the DASH diet. DASH stands for Dietary Approaches to Stop Hypertension. To eat this way: Eat   plenty of fresh fruits and vegetables. Try to fill one half of your plate at each meal with fruits and vegetables. Eat whole grains, such as whole-wheat pasta, brown rice, or whole-grain bread. Fill about one  fourth of your plate with whole grains. Eat or drink low-fat dairy products, such as skim milk or low-fat yogurt. Avoid fatty cuts of meat, processed or cured meats, and poultry with skin. Fill about one fourth of your plate with lean proteins, such as fish, chicken without skin, beans, eggs, or tofu. Avoid pre-made and processed foods. These tend to be higher in sodium, added sugar, and fat. Reduce your daily sodium intake. Many people with hypertension should eat less than 1,500 mg of sodium a day. Do not drink alcohol if: Your health care provider tells you not to drink. You are pregnant, may be pregnant, or are planning to become pregnant. If you drink alcohol: Limit how much you have to: 0-1 drink a day for women. 0-2 drinks a day for men. Know how much alcohol is in your drink. In the [place] one drink equals one 12 oz bottle of beer (355 mL), one 5 oz glass of wine (148 mL), or one 1 oz glass of hard liquor (44 mL). Lifestyle  Work with your health care provider to maintain a healthy body weight or to lose weight. Ask what an ideal weight is for you. Get at least 30 minutes of exercise that causes your heart to beat faster (aerobic exercise) most days of the week. Activities may include walking, swimming, or biking. Include exercise to strengthen your muscles (resistance exercise), such as Pilates or lifting weights, as part of your weekly exercise routine. Try to do these types of exercises for 30 minutes at least 3 days a week. Do not use any products that contain nicotine or tobacco. These products include cigarettes, chewing tobacco, and vaping devices, such as e-cigarettes. If you need help quitting, ask your health care provider. Monitor your blood pressure at home as told by your health care provider. Keep all follow-up visits. This is important. Medicines Take over-the-counter and prescription medicines only as told by your health care provider. Follow directions carefully. Blood  pressure medicines must be taken as prescribed. Do not skip doses of blood pressure medicine. Doing this puts you at risk for problems and can make the medicine less effective. Ask your health care provider about side effects or reactions to medicines that you should watch for. Contact a health care provider if you: Think you are having a reaction to a medicine you are taking. Have headaches that keep coming back (recurring). Feel dizzy. Have swelling in your ankles. Have trouble with your vision. Get help right away if you: Develop a severe headache or confusion. Have unusual weakness or numbness. Feel faint. Have severe pain in your chest or abdomen. Vomit repeatedly. Have trouble breathing. These symptoms may be an emergency. Get help right away. Call 911. Do not wait to see if the symptoms will go away. Do not drive yourself to the hospital. Summary Hypertension is when the force of blood pumping through your arteries is too strong. If this condition is not controlled, it may put you at risk for serious complications. Your personal target blood pressure may vary depending on your medical conditions, your age, and other factors. For most people, a normal blood pressure is less than 120/80. Hypertension is treated with lifestyle changes, medicines, or a combination of both. Lifestyle changes include losing weight, eating a healthy,   low-sodium diet, exercising more, and limiting alcohol. This information is not intended to replace advice given to you by your health care provider. Make sure you discuss any questions you have with your health care provider. Document Revised: 10/21/2021 Document Reviewed: 10/21/2021 Elsevier Patient Education  2023 [employer]  

## 2023-03-08 NOTE — Addendum Note (Signed)
Addended by: Ladean Raya on: 03/08/2023 02:38 PM   Modules accepted: Orders

## 2023-03-08 NOTE — Progress Notes (Addendum)
Subjective:    Patient ID: Rita Wells, female    DOB: 1933/02/22, 89 y.o.   MRN: [id number]  Chief Complaint  Patient presents with   Medical Management of Chronic Issues   Pt presents to the office today for chronic follow up. She has COPD, but states this is stable. Quit smoking approx 2012.    Hypertension This is a chronic problem. The current episode started more than 1 year ago. The problem has been waxing and waning since onset. The problem is uncontrolled. Associated symptoms include anxiety and malaise/fatigue. Pertinent negatives include no peripheral edema or shortness of breath. Risk factors for coronary artery disease include dyslipidemia and sedentary lifestyle. The current treatment provides mild improvement. Identifiable causes of hypertension include a thyroid problem.  Thyroid Problem Presents for follow-up visit. Symptoms include fatigue and hoarse voice. Patient reports no anxiety, depressed mood, dry skin or leg swelling. The symptoms have been stable. Her past medical history is significant for hyperlipidemia.  Insomnia Primary symptoms: difficulty falling asleep, frequent awakening, malaise/fatigue.   The current episode started more than one year. The onset quality is gradual. The problem occurs intermittently.  Hyperlipidemia This is a chronic problem. The current episode started more than 1 year ago. The problem is controlled. Recent lipid tests were reviewed and are normal. Pertinent negatives include no shortness of breath. Current antihyperlipidemic treatment includes statins. The current treatment provides moderate improvement of lipids. Risk factors for coronary artery disease include dyslipidemia, hypertension, a sedentary lifestyle and post-menopausal.  Anxiety Presents for follow-up visit. Symptoms include insomnia and restlessness. Patient reports no depressed mood, nervous/anxious behavior or shortness of breath. Symptoms occur rarely. The severity of  symptoms is mild.        Review of Systems  Constitutional:  Positive for fatigue and malaise/fatigue.  HENT:  Positive for hoarse voice.   Respiratory:  Negative for shortness of breath.   Psychiatric/Behavioral:  The patient has insomnia. The patient is not nervous/anxious.   All other systems reviewed and are negative.      Objective:   Physical Exam Vitals reviewed.  Constitutional:      General: She is not in acute distress.    Appearance: She is well-developed.  HENT:     Head: Normocephalic and atraumatic.     Right Ear: Tympanic membrane normal.     Left Ear: Tympanic membrane normal.  Eyes:     Pupils: Pupils are equal, round, and reactive to light.  Neck:     Thyroid: No thyromegaly.  Cardiovascular:     Rate and Rhythm: Normal rate and regular rhythm.     Heart sounds: Normal heart sounds. No murmur heard. Pulmonary:     Effort: Pulmonary effort is normal. No respiratory distress.     Breath sounds: Normal breath sounds. No wheezing.  Abdominal:     General: Bowel sounds are normal. There is no distension.     Palpations: Abdomen is soft.     Tenderness: There is no abdominal tenderness.  Musculoskeletal:        General: No tenderness. Normal range of motion.     Cervical back: Normal range of motion and neck supple.     Right lower leg: Edema (3+) present.     Left lower leg: Edema (3+) present.  Skin:    General: Skin is warm and dry.  Neurological:     Mental Status: She is alert and oriented to person, place, and time.     Cranial Nerves:  No cranial nerve deficit.     Deep Tendon Reflexes: Reflexes are normal and symmetric.  Psychiatric:        Behavior: Behavior normal.        Thought Content: Thought content normal.        Judgment: Judgment normal.       BP (!) 166/86   Pulse 74   Temp 97.8 F (36.6 C) (Temporal)   Ht '5\' 2"'$  (1.575 m)   Wt 135 lb 9.6 oz (61.5 kg)   SpO2 97%   BMI 24.80 kg/m      Assessment & Plan:  Blonnie Constantinides  [place] comes in today with chief complaint of Medical Management of Chronic Issues   Diagnosis and orders addressed:  1. Primary hypertension - CMP14+EGFR  2. Essential hypertension Will increase Lisinopril to 20 mg from 10 mg - metoprolol succinate (TOPROL-XL) 100 MG 24 hr tablet; Take with or immediately following a meal.  Dispense: 90 tablet; Refill: 0 - lisinopril (ZESTRIL) 20 MG tablet; Take 1 tablet (20 mg total) by mouth daily.  Dispense: 90 tablet; Refill: 3 - CMP14+EGFR  3. Other hyperlipidemia - rosuvastatin (CRESTOR) 20 MG tablet; Take 1 tablet (20 mg total) by mouth daily.  Dispense: 90 tablet; Refill: 0 - CMP14+EGFR  4. Generalized anxiety disorder  - CMP14+EGFR  5. Insomnia, unspecified type - CMP14+EGFR  6. Hypothyroidism, unspecified type - CMP14+EGFR  7. Allergic rhinitis, unspecified seasonality, unspecified trigger - CMP14+EGFR  8. Chronic obstructive pulmonary disease, unspecified COPD type ([place]) - CMP14+EGFR   Labs pending Health Maintenance reviewed Diet and exercise encouraged  Follow up plan: 2 weeks for HTN   Evelina Dun, FNP

## 2023-03-22 ENCOUNTER — Ambulatory Visit (INDEPENDENT_AMBULATORY_CARE_PROVIDER_SITE_OTHER): Payer: Medicare HMO | Admitting: Family

## 2023-03-22 ENCOUNTER — Encounter: Payer: Self-pay | Admitting: Family

## 2023-03-22 VITALS — BP 149/89 | HR 60 | Temp 97.5°F | Ht 62.0 in | Wt 134.4 lb

## 2023-03-22 DIAGNOSIS — I10 Essential (primary) hypertension: Secondary | ICD-10-CM | POA: Diagnosis not present

## 2023-03-22 DIAGNOSIS — R7989 Other specified abnormal findings of blood chemistry: Secondary | ICD-10-CM

## 2023-03-22 DIAGNOSIS — J449 Chronic obstructive pulmonary disease, unspecified: Secondary | ICD-10-CM | POA: Diagnosis not present

## 2023-03-22 NOTE — Patient Instructions (Signed)
Hypertension, Adult High blood pressure (hypertension) is when the force of blood pumping through the arteries is too strong. The arteries are the blood vessels that carry blood from the heart throughout the body. Hypertension forces the heart to work harder to pump blood and may cause arteries to become narrow or stiff. Untreated or uncontrolled hypertension can lead to a heart attack, heart failure, a stroke, kidney disease, and other problems. A blood pressure reading consists of a higher number over a lower number. Ideally, your blood pressure should be below 120/80. The first ("top") number is called the systolic pressure. It is a measure of the pressure in your arteries as your heart beats. The second ("bottom") number is called the diastolic pressure. It is a measure of the pressure in your arteries as the heart relaxes. What are the causes? The exact cause of this condition is not known. There are some conditions that result in high blood pressure. What increases the risk? Certain factors may make you more likely to develop high blood pressure. Some of these risk factors are under your control, including: Smoking. Not getting enough exercise or physical activity. Being overweight. Having too much fat, sugar, calories, or salt (sodium) in your diet. Drinking too much alcohol. Other risk factors include: Having a personal history of heart disease, diabetes, high cholesterol, or kidney disease. Stress. Having a family history of high blood pressure and high cholesterol. Having obstructive sleep apnea. Age. The risk increases with age. What are the signs or symptoms? High blood pressure may not cause symptoms. Very high blood pressure (hypertensive crisis) may cause: Headache. Fast or irregular heartbeats (palpitations). Shortness of breath. Nosebleed. Nausea and vomiting. Vision changes. Severe chest pain, dizziness, and seizures. How is this diagnosed? This condition is diagnosed by  measuring your blood pressure while you are seated, with your arm resting on a flat surface, your legs uncrossed, and your feet flat on the floor. The cuff of the blood pressure monitor will be placed directly against the skin of your upper arm at the level of your heart. Blood pressure should be measured at least twice using the same arm. Certain conditions can cause a difference in blood pressure between your right and left arms. If you have a high blood pressure reading during one visit or you have normal blood pressure with other risk factors, you may be asked to: Return on a different day to have your blood pressure checked again. Monitor your blood pressure at home for 1 week or longer. If you are diagnosed with hypertension, you may have other blood or imaging tests to help your health care provider understand your overall risk for other conditions. How is this treated? This condition is treated by making healthy lifestyle changes, such as eating healthy foods, exercising more, and reducing your alcohol intake. You may be referred for counseling on a healthy diet and physical activity. Your health care provider may prescribe medicine if lifestyle changes are not enough to get your blood pressure under control and if: Your systolic blood pressure is above 130. Your diastolic blood pressure is above 80. Your personal target blood pressure may vary depending on your medical conditions, your age, and other factors. Follow these instructions at home: Eating and drinking  Eat a diet that is high in fiber and potassium, and low in sodium, added sugar, and fat. An example of this eating plan is called the DASH diet. DASH stands for Dietary Approaches to Stop Hypertension. To eat this way: Eat   plenty of fresh fruits and vegetables. Try to fill one half of your plate at each meal with fruits and vegetables. Eat whole grains, such as whole-wheat pasta, brown rice, or whole-grain bread. Fill about one  fourth of your plate with whole grains. Eat or drink low-fat dairy products, such as skim milk or low-fat yogurt. Avoid fatty cuts of meat, processed or cured meats, and poultry with skin. Fill about one fourth of your plate with lean proteins, such as fish, chicken without skin, beans, eggs, or tofu. Avoid pre-made and processed foods. These tend to be higher in sodium, added sugar, and fat. Reduce your daily sodium intake. Many people with hypertension should eat less than 1,500 mg of sodium a day. Do not drink alcohol if: Your health care provider tells you not to drink. You are pregnant, may be pregnant, or are planning to become pregnant. If you drink alcohol: Limit how much you have to: 0-1 drink a day for women. 0-2 drinks a day for men. Know how much alcohol is in your drink. In the [place] one drink equals one 12 oz bottle of beer (355 mL), one 5 oz glass of wine (148 mL), or one 1 oz glass of hard liquor (44 mL). Lifestyle  Work with your health care provider to maintain a healthy body weight or to lose weight. Ask what an ideal weight is for you. Get at least 30 minutes of exercise that causes your heart to beat faster (aerobic exercise) most days of the week. Activities may include walking, swimming, or biking. Include exercise to strengthen your muscles (resistance exercise), such as Pilates or lifting weights, as part of your weekly exercise routine. Try to do these types of exercises for 30 minutes at least 3 days a week. Do not use any products that contain nicotine or tobacco. These products include cigarettes, chewing tobacco, and vaping devices, such as e-cigarettes. If you need help quitting, ask your health care provider. Monitor your blood pressure at home as told by your health care provider. Keep all follow-up visits. This is important. Medicines Take over-the-counter and prescription medicines only as told by your health care provider. Follow directions carefully. Blood  pressure medicines must be taken as prescribed. Do not skip doses of blood pressure medicine. Doing this puts you at risk for problems and can make the medicine less effective. Ask your health care provider about side effects or reactions to medicines that you should watch for. Contact a health care provider if you: Think you are having a reaction to a medicine you are taking. Have headaches that keep coming back (recurring). Feel dizzy. Have swelling in your ankles. Have trouble with your vision. Get help right away if you: Develop a severe headache or confusion. Have unusual weakness or numbness. Feel faint. Have severe pain in your chest or abdomen. Vomit repeatedly. Have trouble breathing. These symptoms may be an emergency. Get help right away. Call 911. Do not wait to see if the symptoms will go away. Do not drive yourself to the hospital. Summary Hypertension is when the force of blood pumping through your arteries is too strong. If this condition is not controlled, it may put you at risk for serious complications. Your personal target blood pressure may vary depending on your medical conditions, your age, and other factors. For most people, a normal blood pressure is less than 120/80. Hypertension is treated with lifestyle changes, medicines, or a combination of both. Lifestyle changes include losing weight, eating a healthy,   low-sodium diet, exercising more, and limiting alcohol. This information is not intended to replace advice given to you by your health care provider. Make sure you discuss any questions you have with your health care provider. Document Revised: 10/21/2021 Document Reviewed: 10/21/2021 Elsevier Patient Education  2023 [employer]  

## 2023-03-22 NOTE — Progress Notes (Signed)
   Subjective:    Patient ID: ANFAL FIORITO, female    DOB: November 28, 1933, 89 y.o.   MRN: [id number]  Chief Complaint  Patient presents with   Hypertension   PT presents to the office today to recheck HTN. We increased her lisinopril to 20 mg from 10 mg. Her BP is not at goal today.   Her Creatinine was also elevated and needs to be rechecked today.   She has COPD and states this is stable. Quit smoking approx 2012.   She has chronic hoarse voice.  Hypertension This is a chronic problem. The current episode started more than 1 year ago. The problem is unchanged. The problem is controlled. Associated symptoms include malaise/fatigue. Pertinent negatives include no peripheral edema or shortness of breath. Risk factors for coronary artery disease include dyslipidemia, obesity and sedentary lifestyle.      Review of Systems  Constitutional:  Positive for malaise/fatigue.  Respiratory:  Negative for shortness of breath.   All other systems reviewed and are negative.      Objective:   Physical Exam Vitals reviewed.  Constitutional:      General: She is not in acute distress.    Appearance: She is well-developed.  HENT:     Head: Normocephalic and atraumatic.  Eyes:     Pupils: Pupils are equal, round, and reactive to light.  Neck:     Thyroid: No thyromegaly.  Cardiovascular:     Rate and Rhythm: Normal rate and regular rhythm.     Heart sounds: Normal heart sounds. No murmur heard. Pulmonary:     Effort: Pulmonary effort is normal. No respiratory distress.     Breath sounds: Rhonchi present. No wheezing.  Abdominal:     General: Bowel sounds are normal. There is no distension.     Palpations: Abdomen is soft.     Tenderness: There is no abdominal tenderness.  Musculoskeletal:        General: No tenderness. Normal range of motion.     Cervical back: Normal range of motion and neck supple.  Skin:    General: Skin is warm and dry.  Neurological:     Mental Status: She is  alert and oriented to person, place, and time.     Cranial Nerves: No cranial nerve deficit.     Deep Tendon Reflexes: Reflexes are normal and symmetric.  Psychiatric:        Behavior: Behavior normal.        Thought Content: Thought content normal.        Judgment: Judgment normal.       BP (!) 149/89   Pulse 60   Temp (!) 97.5 F (36.4 C) (Temporal)   Ht 5\' 2"  (1.575 m)   Wt 134 lb 6.4 oz (61 kg)   SpO2 99%   BMI 24.58 kg/m      Assessment & Plan:   Kaylee Prevatt Perezgarcia comes in today with chief complaint of Hypertension   Diagnosis and orders addressed:  1. Primary hypertension - CMP14+EGFR  2. Elevated serum creatinine - CMP14+EGFR  3. Chronic obstructive pulmonary disease, unspecified COPD type ([place])   Labs pending Avoid NSAIDs  Health Maintenance reviewed Diet and exercise encouraged  Follow up plan: 2-3 months   Evelina Dun, FNP

## 2023-03-23 ENCOUNTER — Other Ambulatory Visit: Payer: Self-pay | Admitting: Family

## 2023-03-23 DIAGNOSIS — N184 Chronic kidney disease, stage 4 (severe): Secondary | ICD-10-CM

## 2023-03-23 DIAGNOSIS — I10 Essential (primary) hypertension: Secondary | ICD-10-CM

## 2023-03-23 LAB — CMP14+EGFR
ALT: 11 IU/L (ref 0–32)
AST: 14 IU/L (ref 0–40)
Albumin/Globulin Ratio: 1.8 (ref 1.2–2.2)
Albumin: 4.2 g/dL (ref 3.7–4.7)
Alkaline Phosphatase: 101 IU/L (ref 44–121)
BUN/Creatinine Ratio: 12 (ref 12–28)
BUN: 25 mg/dL (ref 8–27)
Bilirubin Total: 0.5 mg/dL (ref 0.0–1.2)
CO2: 20 mmol/L (ref 20–29)
Calcium: 10 mg/dL (ref 8.7–10.3)
Chloride: 100 mmol/L (ref 96–106)
Creatinine, Ser: 2.14 mg/dL — ABNORMAL HIGH (ref 0.57–1.00)
Globulin, Total: 2.4 g/dL (ref 1.5–4.5)
Glucose: 87 mg/dL (ref 70–99)
Potassium: 5 mmol/L (ref 3.5–5.2)
Sodium: 136 mmol/L (ref 134–144)
Total Protein: 6.6 g/dL (ref 6.0–8.5)
eGFR: 22 mL/min/{1.73_m2} — ABNORMAL LOW (ref >59)

## 2023-04-12 ENCOUNTER — Ambulatory Visit (INDEPENDENT_AMBULATORY_CARE_PROVIDER_SITE_OTHER): Payer: Medicare HMO

## 2023-04-12 VITALS — Ht 62.0 in | Wt 134.0 lb

## 2023-04-12 DIAGNOSIS — Z0000 Encounter for general adult medical examination without abnormal findings: Secondary | ICD-10-CM

## 2023-04-12 NOTE — Progress Notes (Signed)
Subjective:   Rita Wells is a 89 y.o. female who presents for Medicare Annual (Subsequent) preventive examination.  I connected with  Rita Wells on 04/12/23 by a audio enabled telemedicine application and verified that I am speaking with the correct person using two identifiers.  Patient Location: Home  Provider Location: Home Office  I discussed the limitations of evaluation and management by telemedicine. The patient expressed understanding and agreed to proceed.  Review of Systems     Cardiac Risk Factors include: advanced age (>29men, >61 women);hypertension;dyslipidemia     Objective:    Today's Vitals   04/12/23 1358  Weight: 134 lb (60.8 kg)  Height: 5\' 2"  (1.575 m)   Body mass index is 24.51 kg/m.     04/12/2023    2:02 PM 04/06/2022    1:59 PM 04/03/2021    2:05 PM 04/18/2018    2:58 PM 04/15/2017   12:05 PM 04/01/2017   10:39 AM  Advanced Directives  Does Patient Have a Medical Advance Directive? No No Yes Yes No No  Type of Best boy of [place];Living will Living will    Does patient want to make changes to medical advance directive?    No - Patient declined    Copy of Healthcare Power of Attorney in Chart?   No - copy requested     Would patient like information on creating a medical advance directive? No - Patient declined No - Patient declined    No - Patient declined    Current Medications (verified) Outpatient Encounter Medications as of 04/12/2023  Medication Sig   fexofenadine (ALLEGRA) 180 MG tablet Take 180 mg by mouth daily.   levothyroxine (SYNTHROID) 88 MCG tablet Take 1 tablet (88 mcg total) by mouth daily.   lisinopril (ZESTRIL) 20 MG tablet Take 1 tablet (20 mg total) by mouth daily.   metoprolol succinate (TOPROL-XL) 100 MG 24 hr tablet Take with or immediately following a meal.   rosuvastatin (CRESTOR) 20 MG tablet Take 1 tablet (20 mg total) by mouth daily.   No facility-administered encounter  medications on file as of 04/12/2023.    Allergies (verified) Azor [amlodipine-olmesartan]   History: Past Medical History:  Diagnosis Date   Glaucoma    Hyperlipidemia    Hypertension    Toxic goiter    Past Surgical History:  Procedure Laterality Date   CERVICAL FUSION     TUBAL LIGATION     Family History  Problem Relation Age of Onset   Heart disease Mother        MI   Heart attack Mother    CVA Father    Stroke Father    Cancer Sister        OVARIAN   Heart disease Brother        MI   Heart attack Brother    Cancer Brother    Heart disease Brother        MI   Cancer Sister    Heart disease Sister    Social History   Socioeconomic History   Marital status: Widowed    Spouse name: Not on file   Number of children: 7   Years of education: Not on file   Highest education level: 10th grade  Occupational History   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 04/03/21   Son lives next door, dx with ALS.    Social Determinants of Health   Financial Resource Strain: Low Risk  (04/12/2023)   Overall Financial Resource Strain (CARDIA)    Difficulty of Paying Living Expenses: Not hard at all  Food Insecurity: No Food Insecurity (04/12/2023)   Hunger Vital Sign    Worried About Running Out of Food in the Last Year: Never true    Ran Out of Food in the Last Year: Never true  Transportation Needs: No Transportation Needs (04/12/2023)   PRAPARE - Administrator, Civil Service (Medical): No    Lack of  Transportation (Non-Medical): No  Physical Activity: Insufficiently Active (04/12/2023)   Exercise Vital Sign    Days of Exercise per Week: 3 days    Minutes of Exercise per Session: 30 min  Stress: No Stress Concern Present (04/12/2023)   [employer] of Occupational Health - Occupational Stress Questionnaire    Feeling of Stress : Not at all  Social Connections: Moderately Integrated (04/12/2023)   Social Connection and Isolation Panel [NHANES]    Frequency of Communication with Friends and Family: More than three times a week    Frequency of Social Gatherings with Friends and Family: More than three times a week    Attends Religious Services: More than 4 times per year    Active Member of [employer] or Organizations: Yes    Attends Banker Meetings: More than 4 times per year    Marital Status: Widowed    Tobacco Counseling Counseling given: Not Answered   Clinical Intake:  Pre-visit preparation completed: Yes  Pain : No/denies pain  Diabetes: No  How often do you need to have someone help you when you read instructions, pamphlets, or other written materials from your doctor or pharmacy?: 1 - Never  Diabetic?No   Interpreter Needed?: No  Information entered by :: Kandis Fantasia LPN   Activities of Daily Living    04/12/2023    2:01 PM  In your present state of health, do you have any difficulty performing the following activities:  Hearing? 0  Vision? 0  Difficulty concentrating or making decisions? 0  Walking or climbing stairs? 0  Dressing or bathing? 0  Doing errands, shopping? 0  Preparing Food and eating ? N  Using the Toilet? N  In the past six months, have you accidently leaked urine? N  Do you have problems with loss of bowel control? N  Managing your Medications? N  Managing your Finances? N  Housekeeping or managing your Housekeeping? N    Patient Care Team: Junie Spencer, FNP as PCP - General (Family Medicine) Eldred Manges, MD as  Consulting Physician (Orthopedic Surgery) Dorisann Frames, MD as Consulting Physician (Endocrinology)  Indicate any recent Medical Services you may have received from other than [facility] providers in the past year (date may be approximate).     Assessment:   This is a routine wellness examination for Rita Wells.  Hearing/Vision screen Hearing Screening - Comments:: Denies hearing difficulties   Vision  Screening - Comments:: Wears rx glasses - up to date with routine eye exams with [facility]    Dietary issues and exercise activities discussed: Current Exercise Habits: Home exercise routine, Type of exercise: walking, Time (Minutes): 30, Frequency (Times/Week): 3, Weekly Exercise (Minutes/Week): 90, Intensity: Mild   Goals Addressed             This Visit's Progress    COMPLETED: Chronic Disease Management Needs       CARE PLAN ENTRY (see longtitudinal plan of care for additional care plan information)  Current Barriers:  Chronic Disease Management support, education, and care coordination needs related to HTN, hypothyroidism, HLD, GAD  Clinical Goals: Over the next 10 days, patient will be contacted by a Care Guide to reschedule their Initial CCM Visit Over the next 30 days, patient will have an Initial CCM Visit with a member of the embedded CCM team to discuss self-management of their chronic medical conditions  Interventions: Chart reviewed in preparation for initial visit telephone call Collaboration with other care team members as needed Unsuccessful outreach to patient  A HIPPA compliant phone message was left for the patient providing contact information and requesting a return call.  Request sent to care guides to reach out and reschedule patient's initial visit  Patient Self Care Activities: Undetermined   Initial goal documentation       Depression Screen    04/12/2023    2:00 PM 03/22/2023    9:11 AM 03/08/2023    1:55 PM 11/24/2022   10:07 AM  06/23/2022    8:54 AM 05/28/2022    9:41 AM 04/06/2022    1:55 PM  PHQ 2/9 Scores  PHQ - 2 Score 0 0 0 0 0 0 0  PHQ- 9 Score   0 0       Fall Risk    04/12/2023    2:01 PM 03/22/2023    9:11 AM 03/08/2023    1:55 PM 11/24/2022   10:07 AM 06/23/2022    8:54 AM  Fall Risk   Falls in the past year? 0 0 0 0 0  Number falls in past yr: 0  0    Injury with Fall? 0  0    Risk for fall due to : No Fall Risks  No Fall Risks    Follow up Falls prevention discussed;Education provided;Falls evaluation completed  Falls evaluation completed  Falls evaluation completed    FALL RISK PREVENTION PERTAINING TO THE HOME:  Any stairs in or around the home? No  If so, are there any without handrails? No  Home free of loose throw rugs in walkways, pet beds, electrical cords, etc? Yes  Adequate lighting in your home to reduce risk of falls? Yes   ASSISTIVE DEVICES UTILIZED TO PREVENT FALLS:  Life alert? No  Use of a cane, walker or w/c? No  Grab bars in the bathroom? Yes  Shower chair or bench in shower? No  Elevated toilet seat or a handicapped toilet? Yes   TIMED UP AND GO:  Was the test performed? No . Telephonic visit   Cognitive Function:    04/18/2018    3:00 PM  MMSE - Mini Mental State Exam  Orientation to time 5  Orientation to Place 5  Registration 3  Attention/ Calculation 5  Recall 3  Language- name 2 objects 2  Language- repeat 1  Language- follow 3 step command 3  Language- read & follow direction 1  Write a sentence 1  Copy design 1  Total score 30        04/12/2023    2:02 PM 04/06/2022    2:04 PM  6CIT Screen  What Year? 0 points 0 points  What month? 0 points 0 points  What time? 0 points 0 points  Count back from 20 0 points 0 points  Months in reverse 0 points 0 points  Repeat phrase 0 points 6 points  Total Score 0 points 6 points    Immunizations Immunization History  Administered Date(s) Administered   Fluad Quad(high Dose 65+) 11/06/2019,  10/17/2020, 10/20/2021, 11/24/2022   Influenza Whole 09/27/2012   Influenza, High Dose Seasonal PF 10/27/2017, 10/05/2018   Influenza,inj,Quad PF,6+ Mos 09/29/2013, 10/03/2014, 10/02/2015, 11/25/2016   Pneumococcal Conjugate-13 10/17/2020   Pneumococcal Polysaccharide-23 10/23/1996, 10/20/2021   Td 08/01/2004   Tdap 03/08/2023    TDAP status: Up to date  Flu Vaccine status: Up to date  Pneumococcal vaccine status: Up to date  Covid-19 vaccine status: Information provided on how to obtain vaccines.   Qualifies for Shingles Vaccine? Yes   Zostavax completed No   Shingrix Completed?: No.    Education has been provided regarding the importance of this vaccine. Patient has been advised to call insurance company to determine out of pocket expense if they have not yet received this vaccine. Advised may also receive vaccine at local pharmacy or Health Dept. Verbalized acceptance and understanding.  Screening Tests Health Maintenance  Topic Date Due   COVID-19 Vaccine (1) Never done   Zoster Vaccines- Shingrix (1 of 2) 06/08/2023 (Originally 09/12/1952)   DEXA SCAN  11/25/2023 (Originally 10/03/2022)   INFLUENZA VACCINE  07/29/2023   Medicare Annual Wellness (AWV)  04/11/2024   DTaP/Tdap/Td (3 - Td or Tdap) 03/07/2033   Pneumonia Vaccine 67+ Years old  Completed   HPV VACCINES  Aged Out    Health Maintenance  Health Maintenance Due  Topic Date Due   COVID-19 Vaccine (1) Never done    Colorectal cancer screening: No longer required.   Mammogram status: No longer required due to age.  Bone Density status: Completed 10/03/20. Results reflect: Bone density results: OSTEOPENIA. Repeat every 2 years. (Declines repeat at this time)   Lung Cancer Screening: (Low Dose CT Chest recommended if Age 81-80 years, 30 pack-year currently smoking OR have quit w/in 15years.) does not qualify.   Lung Cancer Screening Referral: n/a  Additional Screening:  Hepatitis C Screening: does not  qualify;  Vision Screening: Recommended annual ophthalmology exams for early detection of glaucoma and other disorders of the eye. Is the patient up to date with their annual eye exam?  Yes  Who is the provider or what is the name of the office in which the patient attends annual eye exams? [facility]  If pt is not established with a provider, would they like to be referred to a provider to establish care? No .   Dental Screening: Recommended annual dental exams for proper oral hygiene  Community Resource Referral / Chronic Care Management: CRR required this visit?  No   CCM required this visit?  No      Plan:     I have personally reviewed and noted the following in the patient's chart:   Medical and social history Use of alcohol, tobacco or illicit drugs  Current medications and supplements including opioid prescriptions. Patient is not currently taking opioid prescriptions. Functional ability and status Nutritional status Physical activity Advanced directives List of other physicians Hospitalizations, surgeries,  and ER visits in previous 12 months Vitals Screenings to include cognitive, depression, and falls Referrals and appointments  In addition, I have reviewed and discussed with patient certain preventive protocols, quality metrics, and best practice recommendations. A written personalized care plan for preventive services as well as general preventive health recommendations were provided to patient.     Durwin Nora, [place]   [id number]   Due to this being a virtual visit, the after visit summary with patients personalized plan was offered to patient via mail or my-chart. Patient would like to access on my-chart  Nurse Notes: No concerns

## 2023-04-12 NOTE — Patient Instructions (Signed)
Rita Wells , Thank you for taking time to come for your Medicare Wellness Visit. I appreciate your ongoing commitment to your health goals. Please review the following plan we discussed and let me know if I can assist you in the future.   These are the goals we discussed:  Goals      Exercise 150 min/wk Moderate Activity     Have 3 meals a day        This is a list of the screening recommended for you and due dates:  Health Maintenance  Topic Date Due   COVID-19 Vaccine (1) Never done   Zoster (Shingles) Vaccine (1 of 2) 06/08/2023*   DEXA scan (bone density measurement)  11/25/2023*   Flu Shot  07/29/2023   Medicare Annual Wellness Visit  04/11/2024   DTaP/Tdap/Td vaccine (3 - Td or Tdap) 03/07/2033   Pneumonia Vaccine  Completed   HPV Vaccine  Aged Out  *Topic was postponed. The date shown is not the original due date.    Advanced directives: Forms are available if you choose in the future to pursue completion.  This is recommended in order to make sure that your health wishes are honored in the event that you are unable to verbalize them to the provider.    Conditions/risks identified: Aim for 30 minutes of exercise or brisk walking, 6-8 glasses of water, and 5 servings of fruits and vegetables each day.   Next appointment: Follow up in one year for your annual wellness visit    Preventive Care 65 Years and Older, Female Preventive care refers to lifestyle choices and visits with your health care provider that can promote health and wellness. What does preventive care include? A yearly physical exam. This is also called an annual well check. Dental exams once or twice a year. Routine eye exams. Ask your health care provider how often you should have your eyes checked. Personal lifestyle choices, including: Daily care of your teeth and gums. Regular physical activity. Eating a healthy diet. Avoiding tobacco and drug use. Limiting alcohol use. Practicing safe  sex. Taking low-dose aspirin every day. Taking vitamin and mineral supplements as recommended by your health care provider. What happens during an annual well check? The services and screenings done by your health care provider during your annual well check will depend on your age, overall health, lifestyle risk factors, and family history of disease. Counseling  Your health care provider may ask you questions about your: Alcohol use. Tobacco use. Drug use. Emotional well-being. Home and relationship well-being. Sexual activity. Eating habits. History of falls. Memory and ability to understand (cognition). Work and work Astronomer. Reproductive health. Screening  You may have the following tests or measurements: Height, weight, and BMI. Blood pressure. Lipid and cholesterol levels. These may be checked every 5 years, or more frequently if you are over 99 years old. Skin check. Lung cancer screening. You may have this screening every year starting at age 64 if you have a 30-pack-year history of smoking and currently smoke or have quit within the past 15 years. Fecal occult blood test (FOBT) of the stool. You may have this test every year starting at age 53. Flexible sigmoidoscopy or colonoscopy. You may have a sigmoidoscopy every 5 years or a colonoscopy every 10 years starting at age 48. Hepatitis C blood test. Hepatitis B blood test. Sexually transmitted disease (STD) testing. Diabetes screening. This is done by checking your blood sugar (glucose) after you have not eaten for a  while (fasting). You may have this done every 1-3 years. Bone density scan. This is done to screen for osteoporosis. You may have this done starting at age 52. Mammogram. This may be done every 1-2 years. Talk to your health care provider about how often you should have regular mammograms. Talk with your health care provider about your test results, treatment options, and if necessary, the need for more  tests. Vaccines  Your health care provider may recommend certain vaccines, such as: Influenza vaccine. This is recommended every year. Tetanus, diphtheria, and acellular pertussis (Tdap, Td) vaccine. You may need a Td booster every 10 years. Zoster vaccine. You may need this after age 72. Pneumococcal 13-valent conjugate (PCV13) vaccine. One dose is recommended after age 34. Pneumococcal polysaccharide (PPSV23) vaccine. One dose is recommended after age 12. Talk to your health care provider about which screenings and vaccines you need and how often you need them. This information is not intended to replace advice given to you by your health care provider. Make sure you discuss any questions you have with your health care provider. Document Released: 01/10/2016 Document Revised: 09/02/2016 Document Reviewed: 10/15/2015 Elsevier Interactive Patient Education  2017 [employer] Prevention in the Home Falls can cause injuries. They can happen to people of all ages. There are many things you can do to make your home safe and to help prevent falls. What can I do on the outside of my home? Regularly fix the edges of walkways and driveways and fix any cracks. Remove anything that might make you trip as you walk through a door, such as a raised step or threshold. Trim any bushes or trees on the path to your home. Use bright outdoor lighting. Clear any walking paths of anything that might make someone trip, such as rocks or tools. Regularly check to see if handrails are loose or broken. Make sure that both sides of any steps have handrails. Any raised decks and porches should have guardrails on the edges. Have any leaves, snow, or ice cleared regularly. Use sand or salt on walking paths during winter. Clean up any spills in your garage right away. This includes oil or grease spills. What can I do in the bathroom? Use night lights. Install grab bars by the toilet and in the tub and shower.  Do not use towel bars as grab bars. Use non-skid mats or decals in the tub or shower. If you need to sit down in the shower, use a plastic, non-slip stool. Keep the floor dry. Clean up any water that spills on the floor as soon as it happens. Remove soap buildup in the tub or shower regularly. Attach bath mats securely with double-sided non-slip rug tape. Do not have throw rugs and other things on the floor that can make you trip. What can I do in the bedroom? Use night lights. Make sure that you have a light by your bed that is easy to reach. Do not use any sheets or blankets that are too big for your bed. They should not hang down onto the floor. Have a firm chair that has side arms. You can use this for support while you get dressed. Do not have throw rugs and other things on the floor that can make you trip. What can I do in the kitchen? Clean up any spills right away. Avoid walking on wet floors. Keep items that you use a lot in easy-to-reach places. If you need to reach something above you,  use a strong step stool that has a grab bar. Keep electrical cords out of the way. Do not use floor polish or wax that makes floors slippery. If you must use wax, use non-skid floor wax. Do not have throw rugs and other things on the floor that can make you trip. What can I do with my stairs? Do not leave any items on the stairs. Make sure that there are handrails on both sides of the stairs and use them. Fix handrails that are broken or loose. Make sure that handrails are as long as the stairways. Check any carpeting to make sure that it is firmly attached to the stairs. Fix any carpet that is loose or worn. Avoid having throw rugs at the top or bottom of the stairs. If you do have throw rugs, attach them to the floor with carpet tape. Make sure that you have a light switch at the top of the stairs and the bottom of the stairs. If you do not have them, ask someone to add them for you. What else  can I do to help prevent falls? Wear shoes that: Do not have high heels. Have rubber bottoms. Are comfortable and fit you well. Are closed at the toe. Do not wear sandals. If you use a stepladder: Make sure that it is fully opened. Do not climb a closed stepladder. Make sure that both sides of the stepladder are locked into place. Ask someone to hold it for you, if possible. Clearly mark and make sure that you can see: Any grab bars or handrails. First and last steps. Where the edge of each step is. Use tools that help you move around (mobility aids) if they are needed. These include: Canes. Walkers. Scooters. Crutches. Turn on the lights when you go into a dark area. Replace any light bulbs as soon as they burn out. Set up your furniture so you have a clear path. Avoid moving your furniture around. If any of your floors are uneven, fix them. If there are any pets around you, be aware of where they are. Review your medicines with your doctor. Some medicines can make you feel dizzy. This can increase your chance of falling. Ask your doctor what other things that you can do to help prevent falls. This information is not intended to replace advice given to you by your health care provider. Make sure you discuss any questions you have with your health care provider. Document Released: 10/10/2009 Document Revised: 05/21/2016 Document Reviewed: 01/18/2015 Elsevier Interactive Patient Education  2017 [employer].

## 2023-04-20 ENCOUNTER — Encounter: Payer: Self-pay | Admitting: *Deleted

## 2023-05-07 ENCOUNTER — Encounter: Payer: Self-pay | Admitting: Family

## 2023-05-07 ENCOUNTER — Ambulatory Visit (INDEPENDENT_AMBULATORY_CARE_PROVIDER_SITE_OTHER): Payer: Medicare HMO | Admitting: Family

## 2023-05-07 VITALS — BP 128/69 | HR 60 | Temp 97.3°F | Ht 62.0 in | Wt 131.6 lb

## 2023-05-07 DIAGNOSIS — J440 Chronic obstructive pulmonary disease with acute lower respiratory infection: Secondary | ICD-10-CM

## 2023-05-07 DIAGNOSIS — H66002 Acute suppurative otitis media without spontaneous rupture of ear drum, left ear: Secondary | ICD-10-CM | POA: Diagnosis not present

## 2023-05-07 DIAGNOSIS — J209 Acute bronchitis, unspecified: Secondary | ICD-10-CM

## 2023-05-07 MED ORDER — AMOXICILLIN-POT CLAVULANATE 875-125 MG PO TABS: 1.0000 | ORAL_TABLET | Freq: Two times a day (BID) | ORAL | 0 refills | Status: DC

## 2023-05-07 MED ORDER — PREDNISONE 10 MG (21) PO TBPK: ORAL_TABLET | ORAL | 0 refills | Status: DC

## 2023-05-07 MED ORDER — GUAIFENESIN ER 600 MG PO TB12: 1200.0000 mg | ORAL_TABLET | Freq: Two times a day (BID) | ORAL | 1 refills | Status: DC | PRN

## 2023-05-07 NOTE — Patient Instructions (Signed)

## 2023-05-07 NOTE — Progress Notes (Signed)
Subjective:    Patient ID: Rita Wells, female    DOB: Aug 10, 1933, 89 y.o.   MRN: [id number]  Chief Complaint  Patient presents with   Cough    SNEEZING EYES WATERING HOARSENESS IN VOICE X 3 DAYS    PT presents to the office today with coughing, sneezing, and hoarse voice for the last 3 days.  Cough This is a new problem. The current episode started in the past 7 days. The problem has been unchanged. The problem occurs every few minutes. The cough is Productive of sputum. Associated symptoms include nasal congestion, rhinorrhea and a sore throat. Pertinent negatives include no chills, ear congestion, ear pain, fever, headaches or shortness of breath. Associated symptoms comments: Eye watery . She has tried rest and OTC cough suppressant for the symptoms. The treatment provided mild relief.  Dysuria  This is a recurrent problem. The current episode started more than 1 month ago. The problem occurs intermittently. The problem has been waxing and waning. The quality of the pain is described as burning. Associated symptoms include urgency. Pertinent negatives include no chills, hematuria, nausea or vomiting. The treatment provided mild relief.      Review of Systems  Constitutional:  Negative for chills and fever.  HENT:  Positive for rhinorrhea and sore throat. Negative for ear pain.   Respiratory:  Positive for cough. Negative for shortness of breath.   Gastrointestinal:  Negative for nausea and vomiting.  Genitourinary:  Positive for dysuria and urgency. Negative for hematuria.  Neurological:  Negative for headaches.  All other systems reviewed and are negative.      Objective:   Physical Exam Vitals reviewed.  Constitutional:      General: She is not in acute distress.    Appearance: She is well-developed.  HENT:     Head: Normocephalic and atraumatic.     Comments: Thick mucus and getting chocked     Right Ear: Tympanic membrane normal.     Left Ear: Tympanic membrane  normal.  Eyes:     Pupils: Pupils are equal, round, and reactive to light.  Neck:     Thyroid: No thyromegaly.  Cardiovascular:     Rate and Rhythm: Normal rate and regular rhythm.     Heart sounds: Normal heart sounds. No murmur heard. Pulmonary:     Effort: Pulmonary effort is normal. No respiratory distress.     Breath sounds: Rhonchi present. No wheezing.     Comments: Coarse cough Abdominal:     General: Bowel sounds are normal. There is no distension.     Palpations: Abdomen is soft.     Tenderness: There is no abdominal tenderness.  Musculoskeletal:        General: No tenderness. Normal range of motion.     Cervical back: Normal range of motion and neck supple.  Skin:    General: Skin is warm and dry.  Neurological:     Mental Status: She is alert and oriented to person, place, and time.     Cranial Nerves: No cranial nerve deficit.     Deep Tendon Reflexes: Reflexes are normal and symmetric.  Psychiatric:        Behavior: Behavior normal.        Thought Content: Thought content normal.        Judgment: Judgment normal.     BP 128/69   Pulse 60   Temp (!) 97.3 F (36.3 C) (Temporal)   Ht 5\' 2"  (1.575 m)  Wt 131 lb 9.6 oz (59.7 kg)   SpO2 99%   BMI 24.07 kg/m        Assessment & Plan:  Shacora Hallums Hank comes in today with chief complaint of Cough (SNEEZING EYES WATERING HOARSENESS IN VOICE X 3 DAYS )   Diagnosis and orders addressed:  1. Acute bronchitis with COPD (HCC) - Take meds as prescribed - Use a cool mist humidifier  -Use saline nose sprays frequently -Force fluids -For any cough or congestion  Use plain Mucinex- regular strength or max strength is fine -For fever or aces or pains- take tylenol or ibuprofen. -Throat lozenges if help -Follow up if symptoms worsen or do not improve  - predniSONE (STERAPRED UNI-PAK 21 TAB) 10 MG (21) TBPK tablet; Use as directed  Dispense: 21 tablet; Refill: 0 - guaiFENesin (MUCINEX) 600 MG 12 hr tablet; Take  2 tablets (1,200 mg total) by mouth 2 (two) times daily as needed.  Dispense: 120 tablet; Refill: 1  2. Non-recurrent acute suppurative otitis media of left ear without spontaneous rupture of tympanic membrane - predniSONE (STERAPRED UNI-PAK 21 TAB) 10 MG (21) TBPK tablet; Use as directed  Dispense: 21 tablet; Refill: 0 - amoxicillin-clavulanate (AUGMENTIN) 875-125 MG tablet; Take 1 tablet by mouth 2 (two) times daily.  Dispense: 14 tablet; Refill: 0 - guaiFENesin (MUCINEX) 600 MG 12 hr tablet; Take 2 tablets (1,200 mg total) by mouth 2 (two) times daily as needed.  Dispense: 120 tablet; Refill: 1   Jannifer Rodney, FNP

## 2023-05-20 ENCOUNTER — Telehealth: Payer: Self-pay | Admitting: Family

## 2023-05-20 ENCOUNTER — Other Ambulatory Visit: Payer: Self-pay | Admitting: Family

## 2023-05-20 DIAGNOSIS — H66002 Acute suppurative otitis media without spontaneous rupture of ear drum, left ear: Secondary | ICD-10-CM

## 2023-05-20 DIAGNOSIS — J209 Acute bronchitis, unspecified: Secondary | ICD-10-CM

## 2023-05-20 MED ORDER — DOXYCYCLINE HYCLATE 100 MG PO TABS: 100.0000 mg | ORAL_TABLET | Freq: Two times a day (BID) | ORAL | 0 refills | Status: DC

## 2023-05-20 NOTE — Telephone Encounter (Signed)
  Incoming Patient Call  05/20/2023  What symptoms do you have? Still not feeling better. No voice congested  How long have you been sick? 05/07/2023  Have you been seen for this problem? 05/07/2023  If your provider decides to give you a prescription, which pharmacy would you like for it to be sent to? [facility] Pt finished predniSONE (STERAPRED UNI-PAK 21 TAB) 10 MG (21) TBPK tablet.  Patient informed that this information will be sent to the clinical staff for review and that they should receive a follow up call.

## 2023-05-20 NOTE — Telephone Encounter (Signed)
Doxycycline Prescription sent to pharmacy, continue Allegra daily.   Jannifer Rodney, FNP

## 2023-06-05 ENCOUNTER — Other Ambulatory Visit: Payer: Self-pay | Admitting: Family

## 2023-06-25 ENCOUNTER — Ambulatory Visit: Payer: Medicare HMO | Admitting: Family

## 2023-06-28 ENCOUNTER — Encounter: Payer: Self-pay | Admitting: Family

## 2023-06-28 ENCOUNTER — Ambulatory Visit (INDEPENDENT_AMBULATORY_CARE_PROVIDER_SITE_OTHER): Payer: Medicare HMO | Admitting: Family

## 2023-06-28 VITALS — BP 132/83 | HR 65 | Temp 97.2°F | Ht 62.0 in | Wt 127.4 lb

## 2023-06-28 DIAGNOSIS — Z0000 Encounter for general adult medical examination without abnormal findings: Secondary | ICD-10-CM

## 2023-06-28 DIAGNOSIS — F411 Generalized anxiety disorder: Secondary | ICD-10-CM | POA: Diagnosis not present

## 2023-06-28 DIAGNOSIS — L989 Disorder of the skin and subcutaneous tissue, unspecified: Secondary | ICD-10-CM | POA: Diagnosis not present

## 2023-06-28 DIAGNOSIS — G4700 Insomnia, unspecified: Secondary | ICD-10-CM | POA: Diagnosis not present

## 2023-06-28 DIAGNOSIS — R634 Abnormal weight loss: Secondary | ICD-10-CM | POA: Diagnosis not present

## 2023-06-28 DIAGNOSIS — E039 Hypothyroidism, unspecified: Secondary | ICD-10-CM

## 2023-06-28 DIAGNOSIS — J449 Chronic obstructive pulmonary disease, unspecified: Secondary | ICD-10-CM | POA: Diagnosis not present

## 2023-06-28 DIAGNOSIS — Z0001 Encounter for general adult medical examination with abnormal findings: Secondary | ICD-10-CM

## 2023-06-28 DIAGNOSIS — I10 Essential (primary) hypertension: Secondary | ICD-10-CM | POA: Diagnosis not present

## 2023-06-28 DIAGNOSIS — E7849 Other hyperlipidemia: Secondary | ICD-10-CM

## 2023-06-28 DIAGNOSIS — E441 Mild protein-calorie malnutrition: Secondary | ICD-10-CM | POA: Diagnosis not present

## 2023-06-28 MED ORDER — ENSURE ENLIVE PO LIQD
237.0000 mL | Freq: Two times a day (BID) | ORAL | 1 refills | Status: AC
Start: 2023-06-28 — End: ?

## 2023-06-28 NOTE — Addendum Note (Signed)
Addended by: Jannifer Rodney A on: 06/28/2023 09:46 AM   Modules accepted: Orders, Level of Service

## 2023-06-28 NOTE — Progress Notes (Addendum)
Subjective:    Patient ID: Rita Wells, female    DOB: 1933/09/21, 89 y.o.   MRN: [id number]  Chief Complaint  Patient presents with   Medical Management of Chronic Issues    Concerned about losing too much weight.spot on face    Pt presents to the office today for CPE and chronic follow up. She has COPD, but states this is stable. Quit smoking approx 2012.   She has lost 7 lbs since 04/12/23. Reports she has not been trying.      06/28/2023    8:50 AM 05/07/2023   11:31 AM 04/12/2023    1:58 PM  Last 3 Weights  Weight (lbs) 127 lb 6.4 oz 131 lb 9.6 oz 134 lb  Weight (kg) 57.788 kg 59.693 kg 60.782 kg     She has a skin lesion on right cheek that is nonhealing. Has appointment with Dermatologists 08/19/23.  Hypertension This is a chronic problem. The current episode started more than 1 year ago. The problem has been waxing and waning since onset. The problem is uncontrolled. Associated symptoms include anxiety and malaise/fatigue. Pertinent negatives include no peripheral edema or shortness of breath. Risk factors for coronary artery disease include dyslipidemia and sedentary lifestyle. The current treatment provides moderate improvement. Identifiable causes of hypertension include a thyroid problem.  Thyroid Problem Presents for follow-up visit. Symptoms include anxiety, fatigue and hoarse voice. Patient reports no constipation, depressed mood or dry skin. The symptoms have been stable. Her past medical history is significant for hyperlipidemia.  Insomnia Primary symptoms: difficulty falling asleep, frequent awakening, malaise/fatigue.   The current episode started more than one year. The onset quality is gradual. The problem occurs intermittently. The treatment provided no relief.  Hyperlipidemia This is a chronic problem. The current episode started more than 1 year ago. The problem is controlled. Recent lipid tests were reviewed and are normal. Pertinent negatives include no  shortness of breath. Current antihyperlipidemic treatment includes statins. The current treatment provides moderate improvement of lipids. Risk factors for coronary artery disease include dyslipidemia, hypertension and a sedentary lifestyle.  Anxiety Presents for follow-up visit. Symptoms include excessive worry, insomnia and nervous/anxious behavior. Patient reports no depressed mood or shortness of breath. Symptoms occur occasionally. The severity of symptoms is mild.        Review of Systems  Constitutional:  Positive for fatigue and malaise/fatigue.  HENT:  Positive for hoarse voice.   Respiratory:  Negative for shortness of breath.   Gastrointestinal:  Negative for constipation.  Psychiatric/Behavioral:  The patient is nervous/anxious and has insomnia.   All other systems reviewed and are negative.  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 04/03/21   Son lives next door, dx with ALS.     Social Determinants of Health   Financial Resource Strain: Low Risk  (04/12/2023)   Overall Financial Resource Strain (CARDIA)    Difficulty of Paying Living Expenses: Not hard at all  Food Insecurity: No Food Insecurity (04/12/2023)   Hunger Vital Sign    Worried About Running Out of Food in the Last Year: Never true    Ran Out of Food in the Last Year: Never true  Transportation Needs: No Transportation Needs (04/12/2023)   PRAPARE - Administrator, Civil Service (Medical): No    Lack of Transportation (Non-Medical): No  Physical Activity: Insufficiently Active (04/12/2023)   Exercise Vital Sign    Days of Exercise per Week: 3 days    Minutes of Exercise per Session: 30 min  Stress: No Stress Concern Present (04/12/2023)   [employer] of Occupational Health - Occupational Stress Questionnaire    Feeling of Stress : Not at all  Social Connections: Moderately Integrated (04/12/2023)   Social Connection and Isolation Panel [NHANES]    Frequency of Communication with Friends and Family: More than three times a week    Frequency of Social Gatherings with Friends and Family: More than three times a week    Attends Religious Services: More than 4 times per year    Active Member of [employer] or Organizations: Yes    Attends Banker Meetings: More than 4 times per year    Marital Status: Widowed       Objective:   Physical Exam Vitals reviewed.  Constitutional:      General: She is not in acute distress.    Appearance: She is underweight.  HENT:     Head: Normocephalic and atraumatic.  Eyes:     Pupils: Pupils are equal, round, and reactive to light.  Neck:     Thyroid: No thyromegaly.  Cardiovascular:     Rate and Rhythm: Normal rate and regular rhythm.     Heart sounds: Normal heart sounds. No murmur heard. Pulmonary:     Effort: Pulmonary effort is normal. No respiratory distress.     Breath sounds: Normal breath sounds. No wheezing.  Abdominal:      General: Bowel sounds are normal. There is no distension.     Palpations: Abdomen is soft.     Tenderness: There is no abdominal tenderness.  Musculoskeletal:        General: No tenderness. Normal range of motion.     Cervical back: Normal range of motion and neck supple.  Skin:    General: Skin is warm and dry.          Comments: Skin lesion on right cheek 1.1X1 cm  Neurological:     Mental Status: She is alert and oriented to person, place, and time.     Cranial Nerves: No cranial nerve deficit.  Deep Tendon Reflexes: Reflexes are normal and symmetric.  Psychiatric:        Behavior: Behavior normal.        Thought Content: Thought content normal.        Judgment: Judgment normal.       BP 132/83   Pulse 65   Temp (!) 97.2 F (36.2 C) (Temporal)   Ht 5\' 2"  (1.575 m)   Wt 127 lb 6.4 oz (57.8 kg)   SpO2 96%   BMI 23.30 kg/m      Assessment & Plan:  Rita Wells comes in today with chief complaint of Medical Management of Chronic Issues (Concerned about losing too much weight.spot on face )   Diagnosis and orders addressed:  1. Chronic obstructive pulmonary disease, unspecified COPD type (HCC) - CMP14+EGFR - CBC with Differential/Platelet  2. Generalized anxiety disorder - CMP14+EGFR - CBC with Differential/Platelet  3. Other hyperlipidemia - CMP14+EGFR - CBC with Differential/Platelet  4. Primary hypertension - CMP14+EGFR - CBC with Differential/Platelet  5. Hypothyroidism, unspecified type - CMP14+EGFR - CBC with Differential/Platelet - TSH  6. Insomnia, unspecified type - CMP14+EGFR - CBC with Differential/Platelet  7. Weight loss  - CMP14+EGFR - CBC with Differential/Platelet  8. Annual physical exam - CMP14+EGFR - CBC with Differential/Platelet - Lipid panel - TSH   9. Mild protein-calorie malnutrition (HCC) Start Ensure BID with meals  High protein diet TSH pending to make sure this is not causing weight loss  - feeding  supplement (ENSURE ENLIVE / ENSURE PLUS) LIQD; Take 237 mLs by mouth 2 (two) times daily between meals.  Dispense: 23700 mL; Refill: 1  10. Skin lesion of cheek Keep dermatologists follow up  Labs pending Health Maintenance reviewed Diet and exercise encouraged  Follow up plan: 4 months   Jannifer Rodney, FNP

## 2023-06-28 NOTE — Patient Instructions (Signed)
High-Protein and High-Calorie Diet Eating high-protein and high-calorie foods can help you to gain weight, heal after an injury, and recover after an illness or surgery. The specific amount of daily protein and calories you need depends on: Your body weight. The reason this diet is recommended for you. Generally, a high-protein, high-calorie diet involves: Eating 250-500 extra calories each day. Making sure that you get enough of your daily calories from protein. Ask your health care provider how many of your calories should come from protein. Talk with a health care provider or a dietitian about how much protein and how many calories you need each day. Follow the diet as directed by your health care provider. What are tips for following this plan?  Reading food labels Check the nutrition facts label for calories, grams of fat and protein. Items with more than 4 grams of protein are high-protein foods. Preparing meals Add whole milk, half-and-half, or heavy cream to cereal, pudding, soup, or hot cocoa. Add whole milk to instant breakfast drinks. Add peanut butter to oatmeal or smoothies. Add powdered milk to baked goods, smoothies, or milkshakes. Add powdered milk, cream, or butter to mashed potatoes. Add cheese to cooked vegetables. Make whole-milk yogurt parfaits. Top them with granola, fruit, or nuts. Add cottage cheese to fruit. Add avocado, cheese, or both to sandwiches or salads. Add avocado to smoothies. Add meat, poultry, or seafood to rice, pasta, casseroles, salads, and soups. Use mayonnaise when making egg salad, chicken salad, or tuna salad. Use peanut butter as a dip for fruits and vegetables or as a topping for pretzels, celery, or crackers. Add beans to casseroles, dips, and spreads. Add pureed beans to sauces and soups. Replace calorie-free drinks with calorie-containing drinks, such as milk and fruit juice. Replace water with milk or heavy cream when making foods such as  oatmeal, pudding, or cocoa. Add oil or butter to cooked vegetables and grains. Add cream cheese to sandwiches or as a topping on crackers and bread. Make cream-based pastas and soups. General information Ask your health care provider if you should take a nutritional supplement. Try to eat six small meals each day instead of three large meals. A general goal is to eat every 2 to 3 hours. Eat a balanced diet. In each meal, include one food that is high in protein and one food with fat in it. Keep nutritious snacks available, such as nuts, trail mixes, dried fruit, and yogurt. If you have kidney disease or diabetes, talk with your health care provider about how much protein is safe for you. Too much protein may put extra stress on your kidneys. Drink your calories. Choose high-calorie drinks and have them after your meals. Consider setting a timer to remind you to eat. You will want to eat even if you do not feel very hungry. What high-protein foods should I eat?  Vegetables Soybeans. Peas. Grains Quinoa. Bulgur wheat. Buckwheat. Meats and other proteins Beef, pork, and poultry. Fish and seafood. Eggs. Tofu. Textured vegetable protein (TVP). Peanut butter. Nuts and seeds. Dried beans. Protein powders. Hummus. Dairy Whole milk. Whole-milk yogurt. Powdered milk. Cheese. Cottage Cheese. Eggnog. Beverages High-protein supplement drinks. Soy milk. Other foods Protein bars. The items listed above may not be a complete list of foods and beverages you can eat and drink. Contact a dietitian for more information. What high-calorie foods should I eat? Fruits Dried fruit. Fruit leather. Canned fruit in syrup. Fruit juice. Avocado. Vegetables Vegetables cooked in oil or butter. Fried potatoes. Grains   Pasta. Quick breads. Muffins. Pancakes. Ready-to-eat cereal. Meats and other proteins Peanut butter. Nuts and seeds. Dairy Heavy cream. Whipped cream. Cream cheese. Sour cream. Ice cream. Custard.  Pudding. Whole milk dairy products. Beverages Meal-replacement beverages. Nutrition shakes. Fruit juice. Seasonings and condiments Salad dressing. Mayonnaise. Alfredo sauce. Fruit preserves or jelly. Honey. Syrup. Sweets and desserts Cake. Cookies. Pie. Pastries. Candy bars. Chocolate. Fats and oils Butter or margarine. Oil. Gravy. Other foods Meal-replacement bars. The items listed above may not be a complete list of foods and beverages you can eat and drink. Contact a dietitian for more information. Summary A high-protein, high-calorie diet can help you gain weight or heal faster after an injury, illness, or surgery. To increase your protein and calories, add ingredients such as whole milk, peanut butter, cheese, beans, meat, or seafood to meal items. To get enough extra calories each day, include high-calorie foods and beverages at each meal. Adding a high-calorie drink or shake can be an easy way to help you get enough calories each day. Talk with your healthcare provider or dietitian about the best options for you. This information is not intended to replace advice given to you by your health care provider. Make sure you discuss any questions you have with your health care provider. Document Revised: 11/15/2020 Document Reviewed: 11/17/2020 Elsevier Patient Education  2024 Elsevier Inc.  

## 2023-06-29 LAB — CBC WITH DIFFERENTIAL/PLATELET
Basophils Absolute: 0.1 10*3/uL (ref 0.0–0.2)
Basos: 1 %
EOS (ABSOLUTE): 0.6 10*3/uL — ABNORMAL HIGH (ref 0.0–0.4)
Eos: 7 %
Hematocrit: 38.7 % (ref 34.0–46.6)
Hemoglobin: 13.2 g/dL (ref 11.1–15.9)
Immature Grans (Abs): 0 10*3/uL (ref 0.0–0.1)
Immature Granulocytes: 0 %
Lymphocytes Absolute: 1.3 10*3/uL (ref 0.7–3.1)
Lymphs: 16 %
MCH: 28.9 pg (ref 26.6–33.0)
MCHC: 34.1 g/dL (ref 31.5–35.7)
MCV: 85 fL (ref 79–97)
Monocytes Absolute: 0.6 10*3/uL (ref 0.1–0.9)
Monocytes: 8 %
Neutrophils Absolute: 5.6 10*3/uL (ref 1.4–7.0)
Neutrophils: 68 %
Platelets: 221 10*3/uL (ref 150–450)
RBC: 4.57 x10E6/uL (ref 3.77–5.28)
RDW: 14 % (ref 11.7–15.4)
WBC: 8.2 10*3/uL (ref 3.4–10.8)

## 2023-06-29 LAB — LIPID PANEL
Chol/HDL Ratio: 2.7 ratio (ref 0.0–4.4)
Cholesterol, Total: 164 mg/dL (ref 100–199)
HDL: 60 mg/dL (ref >39)
LDL Chol Calc (NIH): 84 mg/dL (ref 0–99)
Triglycerides: 112 mg/dL (ref 0–149)
VLDL Cholesterol Cal: 20 mg/dL (ref 5–40)

## 2023-06-29 LAB — CMP14+EGFR
ALT: 14 IU/L (ref 0–32)
AST: 21 IU/L (ref 0–40)
Albumin: 4 g/dL (ref 3.7–4.7)
Alkaline Phosphatase: 87 IU/L (ref 44–121)
BUN/Creatinine Ratio: 11 — ABNORMAL LOW (ref 12–28)
BUN: 24 mg/dL (ref 8–27)
Bilirubin Total: 0.4 mg/dL (ref 0.0–1.2)
CO2: 21 mmol/L (ref 20–29)
Calcium: 9.9 mg/dL (ref 8.7–10.3)
Chloride: 98 mmol/L (ref 96–106)
Creatinine, Ser: 2.16 mg/dL — ABNORMAL HIGH (ref 0.57–1.00)
Globulin, Total: 2.3 g/dL (ref 1.5–4.5)
Glucose: 90 mg/dL (ref 70–99)
Potassium: 4.7 mmol/L (ref 3.5–5.2)
Sodium: 132 mmol/L — ABNORMAL LOW (ref 134–144)
Total Protein: 6.3 g/dL (ref 6.0–8.5)
eGFR: 22 mL/min/{1.73_m2} — ABNORMAL LOW (ref >59)

## 2023-06-29 LAB — TSH: TSH: 0.814 u[IU]/mL (ref 0.450–4.500)

## 2023-07-19 ENCOUNTER — Other Ambulatory Visit: Payer: Self-pay | Admitting: Family

## 2023-07-19 DIAGNOSIS — E7849 Other hyperlipidemia: Secondary | ICD-10-CM

## 2023-08-24 DIAGNOSIS — C44319 Basal cell carcinoma of skin of other parts of face: Secondary | ICD-10-CM | POA: Diagnosis not present

## 2023-08-24 DIAGNOSIS — D485 Neoplasm of uncertain behavior of skin: Secondary | ICD-10-CM | POA: Diagnosis not present

## 2023-08-31 ENCOUNTER — Ambulatory Visit (INDEPENDENT_AMBULATORY_CARE_PROVIDER_SITE_OTHER): Payer: Medicare HMO | Admitting: Family

## 2023-08-31 ENCOUNTER — Encounter: Payer: Self-pay | Admitting: Family

## 2023-08-31 VITALS — BP 142/79 | HR 61 | Temp 97.6°F | Ht 62.0 in | Wt 126.6 lb

## 2023-08-31 DIAGNOSIS — R490 Dysphonia: Secondary | ICD-10-CM

## 2023-08-31 DIAGNOSIS — Z9889 Other specified postprocedural states: Secondary | ICD-10-CM | POA: Diagnosis not present

## 2023-08-31 DIAGNOSIS — Z87891 Personal history of nicotine dependence: Secondary | ICD-10-CM

## 2023-08-31 DIAGNOSIS — J449 Chronic obstructive pulmonary disease, unspecified: Secondary | ICD-10-CM | POA: Diagnosis not present

## 2023-08-31 DIAGNOSIS — R634 Abnormal weight loss: Secondary | ICD-10-CM | POA: Diagnosis not present

## 2023-08-31 DIAGNOSIS — E7849 Other hyperlipidemia: Secondary | ICD-10-CM | POA: Diagnosis not present

## 2023-08-31 DIAGNOSIS — I10 Essential (primary) hypertension: Secondary | ICD-10-CM | POA: Diagnosis not present

## 2023-08-31 DIAGNOSIS — G4700 Insomnia, unspecified: Secondary | ICD-10-CM | POA: Diagnosis not present

## 2023-08-31 DIAGNOSIS — F411 Generalized anxiety disorder: Secondary | ICD-10-CM | POA: Diagnosis not present

## 2023-08-31 DIAGNOSIS — R7989 Other specified abnormal findings of blood chemistry: Secondary | ICD-10-CM

## 2023-08-31 DIAGNOSIS — E039 Hypothyroidism, unspecified: Secondary | ICD-10-CM | POA: Diagnosis not present

## 2023-08-31 NOTE — Patient Instructions (Signed)
Hoarseness  Hoarseness, also called dysphonia, is any abnormal change in your voice that can make it difficult to speak. Your voice may sound raspy, breathy, or strained. Hoarseness is caused by a problem with your vocal cords (vocal folds). These are two bands of tissue inside your voice box (larynx). When you speak, your vocal cords move back and forth to create sound. The surfaces of your vocal cords need to be smooth for your voice to sound clear. Swelling or lumps on your vocal cords can cause hoarseness. Vocal cord problems may be the result of injuries or abnormal growths, certain diseases, upper respiratory infection, or allergies. Other causes may include medicine side effects and exposure to irritants. Follow these instructions at home:  Pay attention to any changes in your symptoms. Take these actions to stay safe and to help relieve your symptoms: Lifestyle Do not eat foods that give you heartburn, such as spicy or acidic foods like hot peppers and orange juice. These foods can cause a gastroesophageal reflux that may worsen your vocal cord problems. Limit how much alcohol and caffeine you drink as told by your health care provider. Drink enough fluid to keep your urine pale yellow. Do not use any products that contain nicotine or tobacco. These products include cigarettes, chewing tobacco, and vaping devices, such as e-cigarettes. If you need help quitting, ask your health care provider. Avoid secondhand smoke. General instructions Use a humidifier if the air in your home is dry. Avoid coughing or clearing your throat. Do not whisper. Whispering can cause muscle strain. Do not speak in a loud or harsh voice. Rest your voice. If recommended by your health care provider, schedule an appointment with a speech-language specialist. This specialist may give you methods to try that can help you avoid misusing your voice. Contact a health care provider if: Your voice is hoarse longer than  2 weeks. You almost lose or completely lose your voice for more than 3 days. You have pain when you swallow or try to talk. You feel a lump in your neck. Get help right away if: You have trouble swallowing. You feel like you are choking when you swallow. You cough up blood or vomit blood. You have trouble breathing. You choke, cannot swallow, or cannot breathe if you lie flat. You notice swelling or a rash on your body, face, or tongue. These symptoms may represent a serious problem that is an emergency. Do not wait to see if the symptoms will go away. Get medical help right away. Call your local emergency services (911 in the [place] Do not drive yourself to the hospital. Summary Hoarseness, also called dysphonia, is any abnormal change in your voice that can make it difficult to speak. Your voice may sound raspy, breathy, or strained. Hoarseness is caused by a problem with your vocal cords (vocal folds). Do not speak in a loud or harsh voice, whisper, use nicotine or tobacco products, or eat foods that give you heartburn. See your health care provider if your hoarseness does not improve after 2 weeks. This information is not intended to replace advice given to you by your health care provider. Make sure you discuss any questions you have with your health care provider. Document Revised: 06/03/2021 Document Reviewed: 06/03/2021 Elsevier Patient Education  2024 Elsevier Inc.  

## 2023-08-31 NOTE — Progress Notes (Signed)
Subjective:    Patient ID: Rita Wells, female    DOB: 1933/07/17, 89 y.o.   MRN: [id number]  Chief Complaint  Patient presents with   Follow-up   Allergies    Wants zrytec called in   Pt presents to the office today for chronic follow up. She has COPD, but states this is stable. Quit smoking approx 2012.   She has hoarse voice. She had neck surgery 5 years and was told it was possible she could have had damage to her vocal cords.    She has skin cancer on her check and has an appointment next week to remove.    She has lost 8 lbs since 04/12/23. Reports she has not been trying. She is not drinking ensures.      08/31/2023    2:10 PM 06/28/2023    8:50 AM 05/07/2023   11:31 AM  Last 3 Weights  Weight (lbs) 126 lb 9.6 oz 127 lb 6.4 oz 131 lb 9.6 oz  Weight (kg) 57.425 kg 57.788 kg 59.693 kg     Hypertension This is a chronic problem. The current episode started more than 1 year ago. The problem has been waxing and waning since onset. The problem is uncontrolled. Associated symptoms include anxiety and malaise/fatigue. Pertinent negatives include no peripheral edema or shortness of breath. Risk factors for coronary artery disease include dyslipidemia and sedentary lifestyle. The current treatment provides moderate improvement. Identifiable causes of hypertension include a thyroid problem.  Thyroid Problem Presents for follow-up visit. Symptoms include anxiety and hoarse voice. Patient reports no dry skin or fatigue. The symptoms have been stable. Her past medical history is significant for hyperlipidemia.  Insomnia Primary symptoms: sleep disturbance, difficulty falling asleep, malaise/fatigue.   The current episode started more than one year. The onset quality is gradual. The problem occurs intermittently. Past treatments include meditation. The treatment provided moderate relief.  Hyperlipidemia This is a chronic problem. The current episode started more than 1 year ago. The  problem is controlled. Recent lipid tests were reviewed and are normal. Pertinent negatives include no shortness of breath. Current antihyperlipidemic treatment includes statins. The current treatment provides moderate improvement of lipids. Risk factors for coronary artery disease include dyslipidemia, hypertension, a sedentary lifestyle and post-menopausal.  Anxiety Presents for follow-up visit. Symptoms include excessive worry, insomnia, nervous/anxious behavior and restlessness. Patient reports no shortness of breath. Symptoms occur occasionally. The severity of symptoms is mild.        Review of Systems  Constitutional:  Positive for malaise/fatigue. Negative for fatigue.  HENT:  Positive for hoarse voice.   Respiratory:  Negative for shortness of breath.   Psychiatric/Behavioral:  Positive for sleep disturbance. The patient is nervous/anxious and has insomnia.   All other systems reviewed and are negative.      Objective:   Physical Exam Vitals reviewed.  Constitutional:      General: She is not in acute distress.    Appearance: She is well-developed.  HENT:     Head: Normocephalic and atraumatic.     Comments: Hoarse voice    Right Ear: Tympanic membrane normal.     Left Ear: Tympanic membrane normal.  Eyes:     Pupils: Pupils are equal, round, and reactive to light.  Neck:     Thyroid: No thyromegaly.  Cardiovascular:     Rate and Rhythm: Normal rate and regular rhythm.     Heart sounds: Normal heart sounds. No murmur heard. Pulmonary:  Effort: Pulmonary effort is normal. No respiratory distress.     Breath sounds: Normal breath sounds. No wheezing.  Abdominal:     General: Bowel sounds are normal. There is no distension.     Palpations: Abdomen is soft.     Tenderness: There is no abdominal tenderness.  Musculoskeletal:        General: No tenderness. Normal range of motion.     Cervical back: Normal range of motion and neck supple.  Skin:    General: Skin is  warm and dry.  Neurological:     Mental Status: She is alert and oriented to person, place, and time.     Cranial Nerves: No cranial nerve deficit.     Deep Tendon Reflexes: Reflexes are normal and symmetric.  Psychiatric:        Behavior: Behavior normal.        Thought Content: Thought content normal.        Judgment: Judgment normal.       BP (!) 152/84   Pulse 61   Temp 97.6 F (36.4 C) (Temporal)   Ht 5\' 2"  (1.575 m)   Wt 126 lb 9.6 oz (57.4 kg)   SpO2 99%   BMI 23.16 kg/m      Assessment & Plan:   Rita Wells comes in today with chief complaint of Follow-up and Allergies (Wants zrytec called in)   Diagnosis and orders addressed:  1. Hoarseness of voice - Ambulatory referral to ENT - CMP14+EGFR  2. Insomnia, unspecified type - CMP14+EGFR  3. Hypothyroidism, unspecified type - CMP14+EGFR  4. Primary hypertension - CMP14+EGFR  5. Other hyperlipidemia - CMP14+EGFR  6. Generalized anxiety disorder - CMP14+EGFR  7. Chronic obstructive pulmonary disease, unspecified COPD type (HCC) - CMP14+EGFR  8. Weight loss - Ambulatory referral to ENT - CMP14+EGFR  9. H/O cervical spine surgery - CMP14+EGFR  10. History of smoking - Ambulatory referral to ENT - CMP14+EGFR  11. Elevated serum creatinine - CMP14+EGFR   Labs pending Referral to ENT given hoarse, hx of smoking, and weight loss. Worrisome for cancer and want her get worked up.  Continue current medications  High protein diet, encourage ensure milkshakes.   Health Maintenance reviewed Diet and exercise encouraged  Follow up plan: 3 months    Jannifer Rodney, FNP

## 2023-09-01 ENCOUNTER — Telehealth: Payer: Self-pay | Admitting: Family

## 2023-09-01 LAB — CMP14+EGFR
ALT: 13 IU/L (ref 0–32)
AST: 19 IU/L (ref 0–40)
Albumin: 4.3 g/dL (ref 3.7–4.7)
Alkaline Phosphatase: 99 IU/L (ref 44–121)
BUN/Creatinine Ratio: 13 (ref 12–28)
BUN: 30 mg/dL — ABNORMAL HIGH (ref 8–27)
Bilirubin Total: 0.3 mg/dL (ref 0.0–1.2)
CO2: 21 mmol/L (ref 20–29)
Calcium: 10 mg/dL (ref 8.7–10.3)
Chloride: 98 mmol/L (ref 96–106)
Creatinine, Ser: 2.25 mg/dL — ABNORMAL HIGH (ref 0.57–1.00)
Globulin, Total: 2 g/dL (ref 1.5–4.5)
Glucose: 80 mg/dL (ref 70–99)
Potassium: 5.3 mmol/L — ABNORMAL HIGH (ref 3.5–5.2)
Sodium: 134 mmol/L (ref 134–144)
Total Protein: 6.3 g/dL (ref 6.0–8.5)
eGFR: 21 mL/min/{1.73_m2} — ABNORMAL LOW (ref >59)

## 2023-09-01 NOTE — Telephone Encounter (Signed)
Pt has open referral to Dr Waldron Session gave contact number so they can call and schedule.

## 2023-09-02 ENCOUNTER — Other Ambulatory Visit: Payer: Self-pay | Admitting: Family

## 2023-09-02 DIAGNOSIS — I10 Essential (primary) hypertension: Secondary | ICD-10-CM

## 2023-09-02 DIAGNOSIS — N184 Chronic kidney disease, stage 4 (severe): Secondary | ICD-10-CM

## 2023-09-22 DIAGNOSIS — D225 Melanocytic nevi of trunk: Secondary | ICD-10-CM | POA: Diagnosis not present

## 2023-09-22 DIAGNOSIS — L814 Other melanin hyperpigmentation: Secondary | ICD-10-CM | POA: Diagnosis not present

## 2023-09-22 DIAGNOSIS — C44319 Basal cell carcinoma of skin of other parts of face: Secondary | ICD-10-CM | POA: Diagnosis not present

## 2023-09-22 DIAGNOSIS — L821 Other seborrheic keratosis: Secondary | ICD-10-CM | POA: Diagnosis not present

## 2023-09-30 DIAGNOSIS — N184 Chronic kidney disease, stage 4 (severe): Secondary | ICD-10-CM | POA: Diagnosis not present

## 2023-09-30 DIAGNOSIS — I10 Essential (primary) hypertension: Secondary | ICD-10-CM | POA: Diagnosis not present

## 2023-10-01 ENCOUNTER — Encounter (HOSPITAL_COMMUNITY): Payer: Self-pay | Admitting: Nephrology

## 2023-10-01 ENCOUNTER — Other Ambulatory Visit (HOSPITAL_COMMUNITY): Payer: Self-pay | Admitting: Nephrology

## 2023-10-01 DIAGNOSIS — N184 Chronic kidney disease, stage 4 (severe): Secondary | ICD-10-CM

## 2023-10-04 ENCOUNTER — Other Ambulatory Visit: Payer: Medicare HMO

## 2023-10-04 DIAGNOSIS — N184 Chronic kidney disease, stage 4 (severe): Secondary | ICD-10-CM | POA: Diagnosis not present

## 2023-10-04 DIAGNOSIS — I10 Essential (primary) hypertension: Secondary | ICD-10-CM | POA: Diagnosis not present

## 2023-10-05 DIAGNOSIS — C44319 Basal cell carcinoma of skin of other parts of face: Secondary | ICD-10-CM | POA: Diagnosis not present

## 2023-10-14 ENCOUNTER — Ambulatory Visit (HOSPITAL_COMMUNITY)
Admission: RE | Admit: 2023-10-14 | Discharge: 2023-10-14 | Disposition: A | Discharge status: Home / Self Care | Payer: Medicare HMO | Source: Ambulatory Visit | Attending: Nephrology | Admitting: Nephrology

## 2023-10-14 DIAGNOSIS — N184 Chronic kidney disease, stage 4 (severe): Secondary | ICD-10-CM | POA: Insufficient documentation

## 2023-10-14 DIAGNOSIS — N261 Atrophy of kidney (terminal): Secondary | ICD-10-CM | POA: Diagnosis not present

## 2023-10-15 ENCOUNTER — Institutional Professional Consult (permissible substitution) (INDEPENDENT_AMBULATORY_CARE_PROVIDER_SITE_OTHER): Payer: Commercial Managed Care - HMO | Admitting: Otolaryngology

## 2023-10-19 ENCOUNTER — Ambulatory Visit (INDEPENDENT_AMBULATORY_CARE_PROVIDER_SITE_OTHER): Payer: Medicare HMO | Admitting: Otolaryngology

## 2023-10-19 ENCOUNTER — Encounter (INDEPENDENT_AMBULATORY_CARE_PROVIDER_SITE_OTHER): Payer: Self-pay | Admitting: Otolaryngology

## 2023-10-19 VITALS — BP 176/84 | HR 66 | Ht 62.0 in | Wt 123.0 lb

## 2023-10-19 DIAGNOSIS — Z87891 Personal history of nicotine dependence: Secondary | ICD-10-CM

## 2023-10-19 DIAGNOSIS — J383 Other diseases of vocal cords: Secondary | ICD-10-CM

## 2023-10-19 DIAGNOSIS — R0981 Nasal congestion: Secondary | ICD-10-CM

## 2023-10-19 DIAGNOSIS — R0982 Postnasal drip: Secondary | ICD-10-CM | POA: Diagnosis not present

## 2023-10-19 DIAGNOSIS — J381 Polyp of vocal cord and larynx: Secondary | ICD-10-CM | POA: Diagnosis not present

## 2023-10-19 DIAGNOSIS — R490 Dysphonia: Secondary | ICD-10-CM | POA: Diagnosis not present

## 2023-10-19 DIAGNOSIS — J3089 Other allergic rhinitis: Secondary | ICD-10-CM | POA: Diagnosis not present

## 2023-10-19 DIAGNOSIS — J387 Other diseases of larynx: Secondary | ICD-10-CM | POA: Diagnosis not present

## 2023-10-19 DIAGNOSIS — R634 Abnormal weight loss: Secondary | ICD-10-CM

## 2023-10-19 MED ORDER — DESLORATADINE 5 MG PO TABS: 5.0000 mg | ORAL_TABLET | Freq: Every day | ORAL | 3 refills | Status: DC

## 2023-10-19 MED ORDER — FLUTICASONE PROPIONATE 50 MCG/ACT NA SUSP: 2.0000 | Freq: Two times a day (BID) | NASAL | 6 refills | Status: DC

## 2023-10-19 MED ORDER — CETIRIZINE HCL 10 MG PO TABS: 10.0000 mg | ORAL_TABLET | Freq: Every day | ORAL | 11 refills | Status: AC

## 2023-10-19 NOTE — Patient Instructions (Signed)
-   schedule CT neck  - return after testing to discuss surgery

## 2023-10-19 NOTE — Progress Notes (Signed)
ENT CONSULT:  Reason for Consult: chronic hoarseness x 2 years    HPI: Rita Wells is an 89 y.o. female former smoker quit 2 yrs ago, history of hyperthyroidism, s/p RAI in 2012, history of posterior neck fusion, who is here for chronic gradually worsening dysphonia and hoarseness.  She reports some voice changes following her neck procedure over 30 years ago, but noticed that her voice became really scratchy and strained ~ past 2 years.  She had posterior neck fusion and was told it might have affected her vocal cords. She had neck procedure over 30 years ago, and was able to sing prior to that, and following the surgery was no longer able to sing after the procedure. No pain with talking or dyspnea. Denies choking on foods or fluids.  Has memory problems and most history is provided by her granddaughter who accompanied her today. She has environmental allergies, she has nasal congestion, watery eyes and post-nasal congestion. She takes Care[employer] adviser but forgets to take it sometimes. Not on nasal sprays. She was on Zyrtec in the past.  Denies recent intubations or URI symptoms  Records Reviewed:  Had RAI therapy for hyperthyroidism - 2012 - on thyroid hormone supplementation  Had recent skin lesion excision by Dermatology   PCP office note by 08/31/2023 1. Hoarseness of voice - Ambulatory referral to ENT Labs pending Referral to ENT given hoarse, hx of smoking, and weight loss. Worrisome for cancer and want her get worked up.  Continue current medications  High protein diet, encourage ensure milkshakes.   Health Maintenance reviewed Diet and exercise encouraged  7. Chronic obstructive pulmonary disease, unspecified COPD type (HCC)   8. Weight loss - Ambulatory referral to ENT   Past Medical History:  Diagnosis Date   Glaucoma    Hyperlipidemia    Hypertension    Toxic goiter     Past Surgical History:  Procedure Laterality Date   CERVICAL FUSION     TUBAL LIGATION      Family  History  Problem Relation Age of Onset   Heart disease Mother        MI   Heart attack Mother    CVA Father    Stroke Father    Cancer Sister        OVARIAN   Heart disease Brother        MI   Heart attack Brother    Cancer Brother    Heart disease Brother        MI   Cancer Sister    Heart disease Sister     Social History:  reports that she quit smoking about 44 years ago. Her smoking use included cigarettes. She has never used smokeless tobacco. She reports that she does not drink alcohol and does not use drugs.  Allergies:  Allergies  Allergen Reactions   Azor [Amlodipine-Olmesartan]     Medications: I have reviewed the patient's current medications.  The PMH, PSH, Medications, Allergies, and SH were reviewed and updated.  ROS: Constitutional: Negative for fever,(+) weight loss  Cardiovascular: Negative for chest pain and dyspnea on exertion. Respiratory: Is not experiencing shortness of breath at rest. Gastrointestinal: Negative for nausea and vomiting. Neurological: Negative for headaches. Psychiatric: The patient is not nervous/anxious  Blood pressure (!) 176/84, pulse 66, height 5\' 2"  (1.575 m), weight 123 lb (55.8 kg), SpO2 99%.  PHYSICAL EXAM:  Exam: General: Well-developed, well-nourished Communication and Voice: Raspy and strained voice Respiratory Respiratory effort: Equal inspiration and expiration  without stridor Cardiovascular Peripheral Vascular: Warm extremities with equal color/perfusion Eyes: No nystagmus with equal extraocular motion bilaterally Neuro/Psych/Balance: Patient oriented to person, place, and time; Appropriate mood and affect; Gait is intact with no imbalance; Cranial nerves I-XII are intact Head and Face Inspection: Normocephalic and atraumatic without mass or lesion Palpation: Facial skeleton intact without bony stepoffs Salivary Glands: No mass or tenderness Facial Strength: Facial motility symmetric and full  bilaterally ENT Pinna: External ear intact and fully developed External canal: Canal is patent with intact skin Tympanic Membrane: Clear and mobile External Nose: No scar or anatomic deformity Internal Nose: Septum is deviated to the left. No polyp, or purulence. Mucosal edema and erythema present.  Bilateral inferior turbinate hypertrophy.  Lips, Teeth, and gums: Edentulous with dentures in place  Oral cavity/oropharynx: No erythema or exudate, no lesions present Nasopharynx: No mass or lesion with intact mucosa Hypopharynx: Intact mucosa without pooling of secretions Larynx Glottic: Full true vocal cord mobility.  Bilateral vocal fold atrophy, evidence of polypoid lesion along the left true vocal fold, which appears similar to Ranke's polypoid change, ball-valve effect with inspiration and phonation Supraglottic: Two large cystic lesions which appear to occupy false folds left greater than right suspicious for bilateral laryngoceles Interarytenoid Space: No or minimal pachydermia or edema Subglottic Space: Not seen well Neck Neck and Trachea: Midline trachea without mass or lesion Thyroid: No mass or nodularity Lymphatics: No lymphadenopathy  Procedure: Summary of Video-Laryngeal-Stroboscopy: Limited exam due to following findings: Bilateral vocal fold atrophy, evidence of polypoid lesion along the left true vocal fold, which appears similar to Ranke's polypoid change, ball-valve effect with inspiration and phonation Supraglottic: Two large cystic lesions which appear to occupy false folds left greater than right suspicious for bilateral laryngoceles.  Incomplete glottic closure due to presence of a bulky left vocal fold polyp, and vocal fold atrophy.  Supraglottic compression is present.  Unable to observe consistent mucosal wave.   Preoperative diagnosis: hoarseness  Postoperative diagnosis:   same + left vocal fold polyp + laryngeal laryngoceles  Procedure: Flexible fiberoptic  laryngoscopy with stroboscopy (57846)  Surgeon: Ashok Croon, MD  Anesthesia: Topical lidocaine and Afrin  Complications: None  Condition is stable throughout exam  Indications and consent:   The patient presents to the clinic with hoarseness. All the risks, benefits, and potential complications were reviewed with the patient preoperatively and informed verbal consent was obtained.  Procedure: The patient was seated upright in the exam chair.   Topical lidocaine and Afrin were applied to the nasal cavity. After adequate anesthesia had occurred, the flexible telescope was passed into the nasal cavity. The nasopharynx was patent without mass or lesion. The scope was passed behind the soft palate and directed toward the base of tongue. The base of tongue was visualized and was symmetric with no apparent masses or abnormal appearing tissue. There were no signs of a mass or pooling of secretions in the piriform sinuses. The supraglottic structures were normal.  The true vocal cords are mobile. The medial edges were atrophic with lesion along the left true vocal fold. Closure was incomplete. Periodicity present. The mucosal wave and amplitude were not assessed fully. There is moderate interarytenoid pachydermia and post cricoid edema. The mucosa appears with lesion as above.   The laryngoscope was then slowly withdrawn and the patient tolerated the procedure well. There were no complications or blood loss.  Studies Reviewed: 09/29/11 Clinical Data: Toxic multinodular goiter.   NUCLEAR MEDICINE RADIOACTIVE IODINE THERAPY FOR HYPERTHYROIDISM  Technique:  The risks and benefits of radioactive iodine therapy  were discussed with the patient in detail. Alternative therapies  were also mentioned. Radiation safety was discussed with the  patient, including how to protect the general public from exposure.  There were no barriers to communication.  Written consent was  obtained.  The patient then  received a capsule containing the  radiopharmaceutical.  The patient will follow-up with the referring  physician.   Radiopharmaceutical: 31.1 mCi I-131 orally.   Comparison: Thyroid scan 09/09/2011.   IMPRESSION:  Radioactive iodine therapy for toxic multinodular goiter.   Assessment/Plan: Encounter Diagnoses  Name Primary?   Acquired laryngocele Yes   Dysphonia    History of smoking    Reinke's edema of vocal folds    Glottic insufficiency    Age-related vocal fold atrophy    Environmental and seasonal allergies    Chronic nasal congestion    Post-nasal drip    Chronic longstanding dysphonia which started more than 2 decades ago with more recent gradual worsening of voice ~ 2 yrs, with raspy and strained quality.  Per family significant change over time.  History of smoking for many decades quit 2 years ago.  Per record review some weight loss recently.  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  Incomplete glottic closure due to presence of a bulky left vocal fold polyp, and vocal fold atrophy.  Supraglottic compression is present.  Unable to observe consistent mucosal wave.  -I discussed exam findings with the patient and her granddaughter at length today and explained that false fold lesions most likely represent bilateral laryngocele's -Left vocal fold lesion is most likely Ranke's polypoid change due to longstanding history of smoking -Will obtain CT neck to better assess laryngeal masses -She has history of kidney dysfunction and unable to have contrast we will proceed with noncontrasted neck CT -She will return after  testing -I suspect that due to bulky nature of the lesions they contribute to her symptoms and might even explain some of the dysphagia symptoms which she does not endorse but her granddaughter reports (choking on foods at times).  -Will investigate swallowing function following CT neck and consider getting swallow study in the future due to history of weight loss (MBS/esophagram)  -  2.  Chronic nasal congestion and postnasal drainage/suspected environmental allergies  -Evidence of significant nasal mucosal edema inferior turban hypertrophy and septal deviation with clear copious postnasal drainage in the nasopharynx -We discussed the importance of using a nasal spray and systemic antihistamine to help with nasal congestion - trial of Zyrtec 10 mg daily and Flonase 2 puffs b/l nares BID  She will return after imaging to discuss surgical interventions  Thank you for allowing me to participate in the care of this patient. Please do not hesitate to contact me with any questions or concerns.   Ashok Croon, MD Otolaryngology [facility] Phone: [phone] Fax: [phone]    10/19/2023, 6:31 PM

## 2023-10-20 ENCOUNTER — Other Ambulatory Visit: Payer: Self-pay | Admitting: Family

## 2023-10-20 DIAGNOSIS — E7849 Other hyperlipidemia: Secondary | ICD-10-CM

## 2023-10-21 ENCOUNTER — Institutional Professional Consult (permissible substitution) (INDEPENDENT_AMBULATORY_CARE_PROVIDER_SITE_OTHER): Payer: Commercial Managed Care - HMO | Admitting: Otolaryngology

## 2023-10-22 ENCOUNTER — Ambulatory Visit (HOSPITAL_COMMUNITY): Payer: Medicare HMO

## 2023-10-28 ENCOUNTER — Ambulatory Visit (HOSPITAL_COMMUNITY): Payer: Medicare HMO

## 2023-10-29 ENCOUNTER — Ambulatory Visit: Payer: Medicare HMO | Admitting: Family

## 2023-10-31 ENCOUNTER — Ambulatory Visit (HOSPITAL_COMMUNITY): Admission: RE | Admit: 2023-10-31 | Payer: Medicare HMO | Source: Ambulatory Visit

## 2023-11-05 ENCOUNTER — Encounter: Payer: Self-pay | Admitting: Oncology

## 2023-11-05 ENCOUNTER — Inpatient Hospital Stay: Payer: Medicare HMO

## 2023-11-05 ENCOUNTER — Inpatient Hospital Stay: Payer: Medicare HMO | Attending: Oncology | Admitting: Oncology

## 2023-11-05 VITALS — BP 100/70 | HR 65 | Temp 98.0°F | Resp 18 | Ht 63.0 in | Wt 126.1 lb

## 2023-11-05 DIAGNOSIS — N184 Chronic kidney disease, stage 4 (severe): Secondary | ICD-10-CM | POA: Insufficient documentation

## 2023-11-05 DIAGNOSIS — R768 Other specified abnormal immunological findings in serum: Secondary | ICD-10-CM | POA: Insufficient documentation

## 2023-11-05 DIAGNOSIS — R490 Dysphonia: Secondary | ICD-10-CM | POA: Diagnosis not present

## 2023-11-05 DIAGNOSIS — N189 Chronic kidney disease, unspecified: Secondary | ICD-10-CM | POA: Insufficient documentation

## 2023-11-05 NOTE — Patient Instructions (Signed)
VISIT SUMMARY:  During today's visit, we discussed your kidney disease, hoarseness, high blood pressure, weight loss, and history of smoking. We reviewed your recent lab results and discussed the next steps for your ongoing care.  YOUR PLAN:  -CHRONIC KIDNEY DISEASE: Chronic kidney disease means your kidneys are damaged and can't filter blood as well as they should. Your elevated free light chains are likely due to this condition. We will monitor these levels every six months for the next two years.  -HOARSENESS: Your persistent hoarse voice, which worsens with wind exposure, may be related to your history of smoking. You have been referred to an ENT specialist and a CT scan has been recommended to find the cause. Please proceed with the CT scan as advised.  INSTRUCTIONS:  Please follow up with the recommended CT scan for your hoarseness. Continue monitoring your weight and appetite, and keep taking your blood pressure medication as prescribed. We will recheck your free light chains every six months for the next two years.

## 2023-11-05 NOTE — Assessment & Plan Note (Signed)
Stage IV chronic kidney disease with no evidence of anemia -Continue to follow with Dr. Thedore Mins

## 2023-11-05 NOTE — Progress Notes (Signed)
[facility] at [facility] HEMATOLOGY NEW VISIT  Rita Spencer, FNP  REASON FOR REFERRAL: Elevated free light chains   HISTORY OF PRESENT ILLNESS: Rita Wells 89 y.o. female referred for elevated free light chains. She is accompanied by her daughter today. She has no complaints today. She has a past medical history of HTN, CKD. The patient also reports a persistent hoarse voice, which worsens when exposed to wind. The patient has been referred to an ENT specialist for this issue, and a CT scan has been recommended, but not yet completed. The patient also reports fatigue, which she attributes to her age. She denies shortness of breath and bone pain. She has experienced weight loss over the past year, but her appetite is fair.   The patient has a history of smoking, which she quit about a year ago, and does not consume alcohol. She has no family history if cancer.    I have reviewed the past medical history, past surgical history, social history and family history with the patient   ALLERGIES:  is allergic to azor [amlodipine-olmesartan].  MEDICATIONS:  Current Outpatient Medications  Medication Sig Dispense Refill   cetirizine (ZYRTEC) 10 MG tablet Take 1 tablet (10 mg total) by mouth daily. 90 tablet 11   feeding supplement (ENSURE ENLIVE / ENSURE PLUS) LIQD Take 237 mLs by mouth 2 (two) times daily between meals. 23700 mL 1   fluticasone (FLONASE) 50 MCG/ACT nasal spray Place 2 sprays into both nostrils 2 (two) times daily. 16 g 6   levothyroxine (SYNTHROID) 88 MCG tablet TAKE ONE TABLET BY MOUTH DAILY 90 tablet 2   lisinopril (ZESTRIL) 20 MG tablet Take 1 tablet (20 mg total) by mouth daily. 90 tablet 3   metoprolol succinate (TOPROL-XL) 100 MG 24 hr tablet TAKE 1 TABLET DAILY WITH OR IMMEDIATELY AFTER A MEAL 90 tablet 0   rosuvastatin (CRESTOR) 20 MG tablet TAKE 1 TABLET DAILY 90 tablet 0   No current facility-administered medications for this visit.      REVIEW OF SYSTEMS:   Constitutional: Denies fevers, chills or night sweats Eyes: Denies blurriness of vision Ears, nose, mouth, throat, and face: Denies mucositis or sore throat Respiratory: Denies cough, dyspnea or wheezes Cardiovascular: Denies palpitation, chest discomfort or lower extremity swelling Gastrointestinal:  Denies nausea, heartburn or change in bowel habits Skin: Denies abnormal skin rashes Lymphatics: Denies new lymphadenopathy or easy bruising Neurological:Denies numbness, tingling or new weaknesses Behavioral/Psych: Mood is stable, no new changes  All other systems were reviewed with the patient and are negative.  PHYSICAL EXAMINATION:   Vitals:   11/05/23 1107  BP: 100/70  Pulse: 65  Resp: 18  Temp: 98 F (36.7 C)  SpO2: 100%    GENERAL:alert, no distress and comfortable LUNGS: Crackles in left lower lung.  Rest of the lung normal breath sounds HEART: regular rate & rhythm and no murmurs and no lower extremity edema ABDOMEN:abdomen soft, non-tender and normal bowel sounds Musculoskeletal:no cyanosis of digits and no clubbing  NEURO: alert & oriented x 3 with fluent speech.  LABORATORY DATA:  I have reviewed the data as listed and under media from nephrologist 10/04/23: CMP: Cr: 2.62, GFR: 17 SPEP: M spike: Not observed Free kappa light chains: 72.1, free lambda light chains: 31.6, ratio: 2.28 CBC: WBC: 8.9, hemoglobin: 13.6, hematocrit: 42.4, MCV: 88, platelets: 262,    Lab Results  Component Value Date   WBC 8.2 06/28/2023   NEUTROABS 5.6 06/28/2023  HGB 13.2 06/28/2023   HCT 38.7 06/28/2023   MCV 85 06/28/2023   PLT 221 06/28/2023      Component Value Date/Time   NA 134 08/31/2023 1438   K 5.3 (H) 08/31/2023 1438   CL 98 08/31/2023 1438   CO2 21 08/31/2023 1438   GLUCOSE 80 08/31/2023 1438   GLUCOSE 83 06/15/2013 1028   BUN 30 (H) 08/31/2023 1438   CREATININE 2.25 (H) 08/31/2023 1438   CREATININE 0.94 06/15/2013 1028   CALCIUM  10.0 08/31/2023 1438   PROT 6.3 08/31/2023 1438   ALBUMIN 4.3 08/31/2023 1438   AST 19 08/31/2023 1438   ALT 13 08/31/2023 1438   ALKPHOS 99 08/31/2023 1438   BILITOT 0.3 08/31/2023 1438   GFRNONAA 31 (L) 10/31/2020 1308   GFRNONAA 59 (L) 06/15/2013 1028   GFRAA 35 (L) 10/31/2020 1308   GFRAA 68 06/15/2013 1028       Chemistry      Component Value Date/Time   NA 134 08/31/2023 1438   K 5.3 (H) 08/31/2023 1438   CL 98 08/31/2023 1438   CO2 21 08/31/2023 1438   BUN 30 (H) 08/31/2023 1438   CREATININE 2.25 (H) 08/31/2023 1438   CREATININE 0.94 06/15/2013 1028      Component Value Date/Time   CALCIUM 10.0 08/31/2023 1438   ALKPHOS 99 08/31/2023 1438   AST 19 08/31/2023 1438   ALT 13 08/31/2023 1438   BILITOT 0.3 08/31/2023 1438       ASSESSMENT & PLAN:  Patient is a 89 year old female with past medical history of hypertension and CKD stage IV referred for elevated free light chains   Elevated serum immunoglobulin free light chains Patient has elevated free light chains with no M spike on SPEP.  No anemia, hypercalcemia.  No evidence of multiple myeloma or MGUS.  Likely secondary to chronic kidney disease -Monitor free light chains every 6 months for 2 years  Chronic kidney disease Stage IV chronic kidney disease with no evidence of anemia -Continue to follow with Dr. Thedore Mins  Hoarseness of voice Patient has new onset hoarseness of voice and recently saw ENT for that.  She is scheduled to get a CT scan done -Obtain CT scan as recommended   Orders Placed This Encounter  Procedures   CBC with Differential/Platelet    Standing Status:   Future    Standing Expiration Date:   11/04/2024   Comprehensive metabolic panel    Standing Status:   Future    Standing Expiration Date:   11/04/2024   Kappa/lambda light chains    Standing Status:   Future    Standing Expiration Date:   11/04/2024   Multiple Myeloma Panel (SPEP&IFE w/QIG)    Standing Status:   Future    Standing  Expiration Date:   11/04/2024    The total time spent in the appointment was 45 minutes encounter with patients including review of chart and various tests results, discussions about plan of care and coordination of care plan   All questions were answered. The patient knows to call the clinic with any problems, questions or concerns. No barriers to learning was detected.   Cindie Crumbly, MD 11/8/202412:28 PM

## 2023-11-05 NOTE — Assessment & Plan Note (Signed)
Patient has elevated free light chains with no M spike on SPEP.  No anemia, hypercalcemia.  No evidence of multiple myeloma or MGUS.  Likely secondary to chronic kidney disease -Monitor free light chains every 6 months for 2 years

## 2023-11-05 NOTE — Assessment & Plan Note (Signed)
Patient has new onset hoarseness of voice and recently saw ENT for that.  She is scheduled to get a CT scan done -Obtain CT scan as recommended

## 2023-11-13 ENCOUNTER — Ambulatory Visit (HOSPITAL_COMMUNITY)
Admission: RE | Admit: 2023-11-13 | Discharge: 2023-11-13 | Disposition: A | Discharge status: Home / Self Care | Payer: Medicare HMO | Source: Ambulatory Visit | Attending: Otolaryngology | Admitting: Otolaryngology

## 2023-11-13 DIAGNOSIS — J387 Other diseases of larynx: Secondary | ICD-10-CM | POA: Insufficient documentation

## 2023-11-13 DIAGNOSIS — Q313 Laryngocele: Secondary | ICD-10-CM | POA: Diagnosis not present

## 2023-11-13 DIAGNOSIS — Z981 Arthrodesis status: Secondary | ICD-10-CM | POA: Diagnosis not present

## 2023-11-13 DIAGNOSIS — R221 Localized swelling, mass and lump, neck: Secondary | ICD-10-CM | POA: Diagnosis not present

## 2023-11-18 ENCOUNTER — Ambulatory Visit (INDEPENDENT_AMBULATORY_CARE_PROVIDER_SITE_OTHER): Payer: Medicare HMO | Admitting: Otolaryngology

## 2023-11-19 ENCOUNTER — Telehealth (INDEPENDENT_AMBULATORY_CARE_PROVIDER_SITE_OTHER): Payer: Self-pay | Admitting: Otolaryngology

## 2023-11-19 NOTE — Telephone Encounter (Signed)
Spoke with patient and reviewed results of CT neck (showed bilateral laryngoceles, no LAD). Patient was not able to schedule MSB/esophagram, and I advised to give radiology a call and reach out to speech for MBS scheduling. They will let us know if they would like to keep appt 12/01/23 after talking to family.

## 2023-12-01 ENCOUNTER — Encounter (INDEPENDENT_AMBULATORY_CARE_PROVIDER_SITE_OTHER): Payer: Self-pay | Admitting: Otolaryngology

## 2023-12-01 ENCOUNTER — Ambulatory Visit (INDEPENDENT_AMBULATORY_CARE_PROVIDER_SITE_OTHER): Payer: Medicare HMO | Admitting: Otolaryngology

## 2023-12-01 VITALS — BP 179/94 | HR 62

## 2023-12-01 DIAGNOSIS — Z87891 Personal history of nicotine dependence: Secondary | ICD-10-CM

## 2023-12-01 DIAGNOSIS — R0982 Postnasal drip: Secondary | ICD-10-CM

## 2023-12-01 DIAGNOSIS — J387 Other diseases of larynx: Secondary | ICD-10-CM | POA: Diagnosis not present

## 2023-12-01 DIAGNOSIS — R490 Dysphonia: Secondary | ICD-10-CM | POA: Diagnosis not present

## 2023-12-01 DIAGNOSIS — R0981 Nasal congestion: Secondary | ICD-10-CM | POA: Diagnosis not present

## 2023-12-01 DIAGNOSIS — J383 Other diseases of vocal cords: Secondary | ICD-10-CM

## 2023-12-01 DIAGNOSIS — J3089 Other allergic rhinitis: Secondary | ICD-10-CM | POA: Diagnosis not present

## 2023-12-01 DIAGNOSIS — J381 Polyp of vocal cord and larynx: Secondary | ICD-10-CM | POA: Diagnosis not present

## 2023-12-01 NOTE — Progress Notes (Unsigned)
ENT Progress Note:  Update 12/01/23 Discussed the use of AI scribe software for clinical note transcription with the patient, who gave verbal consent to proceed.  History of Present Illness   The patient, an 89 year old with a history of b/l laryngoceles, presents for a follow-up after CT neck w/o contrast. The patient's laryngoceles are notably large, with mucus-filled sacs on both sides of the voice box. Despite the benign nature of the laryngoceles, they have been causing the patient some discomfort, particularly affecting her voice and swallowing. The patient has not experienced any shortness of breath, and her eating habits have not been significantly affected, with only occasional coughing reported. The patient has been managing her symptoms with cough drops as needed. The patient's ability to swallow medication, including multiple pills at once, remains unaffected. The patient's primary concern is the potential impact on her swallowing ability in the future. The patient is considering surgical intervention to address the laryngoceles, understanding that the procedure would involve removal of the laryngoceles through the mouth under general anesthesia. The patient is aware of the potential post-operative symptoms, including throat secretions and possible voice changes, and is prepared for a recovery period involving a soft diet. The patient has no history of stroke, heart attack, or consultations with cardiology, neurology, or pulmonary specialists.        Initial Evaluation 10/19/23 Reason for Consult: chronic hoarseness x 2 years    HPI: Rita Wells is an 89 y.o. female former smoker quit 2 yrs ago, history of hyperthyroidism, s/p RAI in 2012, history of posterior neck fusion, who is here for chronic gradually worsening dysphonia and hoarseness.  She reports some voice changes following her neck procedure over 30 years ago, but noticed that her voice became really scratchy and strained ~  past 2 years.  She had posterior neck fusion and was told it might have affected her vocal cords. She had neck procedure over 30 years ago, and was able to sing prior to that, and following the surgery was no longer able to sing after the procedure. No pain with talking or dyspnea. Denies choking on foods or fluids.  Has memory problems and most history is provided by her granddaughter who accompanied her today. She has environmental allergies, she has nasal congestion, watery eyes and post-nasal congestion. She takes [employer] adviser but forgets to take it sometimes. Not on nasal sprays. She was on Zyrtec in the past.  Denies recent intubations or URI symptoms  Records Reviewed:  Had RAI therapy for hyperthyroidism - 2012 - on thyroid hormone supplementation  Had recent skin lesion excision by Dermatology   PCP office note by 08/31/2023 1. Hoarseness of voice - Ambulatory referral to ENT Labs pending Referral to ENT given hoarse, hx of smoking, and weight loss. Worrisome for cancer and want her get worked up.  Continue current medications  High protein diet, encourage ensure milkshakes.   Health Maintenance reviewed Diet and exercise encouraged  7. Chronic obstructive pulmonary disease, unspecified COPD type (HCC)   8. Weight loss - Ambulatory referral to ENT   Past Medical History:  Diagnosis Date   Glaucoma    Hyperlipidemia    Hypertension    Toxic goiter     Past Surgical History:  Procedure Laterality Date   CERVICAL FUSION     TUBAL LIGATION      Family History  Problem Relation Age of Onset   Heart disease Mother        MI   Heart attack Mother  CVA Father    Stroke Father    Cancer Sister        OVARIAN   Heart disease Brother        MI   Heart attack Brother    Cancer Brother    Heart disease Brother        MI   Cancer Sister    Heart disease Sister     Social History:  reports that she quit smoking about 44 years ago. Her smoking use included cigarettes. She  has never used smokeless tobacco. She reports that she does not drink alcohol and does not use drugs.  Allergies:  Allergies  Allergen Reactions   Azor [Amlodipine-Olmesartan]     Medications: I have reviewed the patient's current medications.  The PMH, PSH, Medications, Allergies, and SH were reviewed and updated.  ROS: Constitutional: Negative for fever,(+) weight loss  Cardiovascular: Negative for chest pain and dyspnea on exertion. Respiratory: Is not experiencing shortness of breath at rest. Gastrointestinal: Negative for nausea and vomiting. Neurological: Negative for headaches. Psychiatric: The patient is not nervous/anxious  There were no vitals taken for this visit.  PHYSICAL EXAM:  Exam: General: Well-developed, well-nourished Communication and Voice: Raspy and strained voice Respiratory Respiratory effort: Equal inspiration and expiration without stridor Cardiovascular Peripheral Vascular: Warm extremities with equal color/perfusion Eyes: No nystagmus with equal extraocular motion bilaterally Neuro/Psych/Balance: Patient oriented to person, place, and time; Appropriate mood and affect; Gait is intact with no imbalance; Cranial nerves I-XII are intact Head and Face Inspection: Normocephalic and atraumatic without mass or lesion Palpation: Facial skeleton intact without bony stepoffs Salivary Glands: No mass or tenderness Facial Strength: Facial motility symmetric and full bilaterally ENT Pinna: External ear intact and fully developed External canal: Canal is patent with intact skin Tympanic Membrane: Clear and mobile External Nose: No scar or anatomic deformity Internal Nose: Septum is deviated to the left. No polyp, or purulence. Mucosal edema and erythema present.  Bilateral inferior turbinate hypertrophy.  Lips, Teeth, and gums: Edentulous with dentures in place  Oral cavity/oropharynx: No erythema or exudate, no lesions present Nasopharynx: No mass or  lesion with intact mucosa Hypopharynx: Intact mucosa without pooling of secretions Larynx Glottic: Full true vocal cord mobility.  Bilateral vocal fold atrophy, evidence of polypoid lesion along the left true vocal fold, which appears similar to Ranke's polypoid change, ball-valve effect with inspiration and phonation Supraglottic: Two large cystic lesions which appear to occupy false folds left greater than right suspicious for bilateral laryngoceles Interarytenoid Space: No or minimal pachydermia or edema Subglottic Space: Not seen well Neck Neck and Trachea: Midline trachea without mass or lesion Thyroid: No mass or nodularity Lymphatics: No lymphadenopathy  Procedure: Summary of Video-Laryngeal-Stroboscopy: Limited exam due to following findings: Bilateral vocal fold atrophy, evidence of polypoid lesion along the left true vocal fold, which appears similar to Ranke's polypoid change, ball-valve effect with inspiration and phonation Supraglottic: Two large cystic lesions which appear to occupy false folds left greater than right suspicious for bilateral laryngoceles.  Incomplete glottic closure due to presence of a bulky left vocal fold polyp, and vocal fold atrophy.  Supraglottic compression is present.  Unable to observe consistent mucosal wave.   Preoperative diagnosis: hoarseness  Postoperative diagnosis:   same + left vocal fold polyp + laryngeal laryngoceles  Procedure: Flexible fiberoptic laryngoscopy with stroboscopy (40981)  Surgeon: Ashok Croon, MD  Anesthesia: Topical lidocaine and Afrin  Complications: None  Condition is stable throughout exam  Indications and consent:  The patient presents to the clinic with hoarseness. All the risks, benefits, and potential complications were reviewed with the patient preoperatively and informed verbal consent was obtained.  Procedure: The patient was seated upright in the exam chair.   Topical lidocaine and Afrin were applied to  the nasal cavity. After adequate anesthesia had occurred, the flexible telescope was passed into the nasal cavity. The nasopharynx was patent without mass or lesion. The scope was passed behind the soft palate and directed toward the base of tongue. The base of tongue was visualized and was symmetric with no apparent masses or abnormal appearing tissue. There were no signs of a mass or pooling of secretions in the piriform sinuses. The supraglottic structures were normal.  The true vocal cords are mobile. The medial edges were atrophic with lesion along the left true vocal fold. Closure was incomplete. Periodicity present. The mucosal wave and amplitude were not assessed fully. There is moderate interarytenoid pachydermia and post cricoid edema. The mucosa appears with lesion as above.   The laryngoscope was then slowly withdrawn and the patient tolerated the procedure well. There were no complications or blood loss.  Studies Reviewed: 09/29/11 Clinical Data: Toxic multinodular goiter.   NUCLEAR MEDICINE RADIOACTIVE IODINE THERAPY FOR HYPERTHYROIDISM   Technique:  The risks and benefits of radioactive iodine therapy  were discussed with the patient in detail. Alternative therapies  were also mentioned. Radiation safety was discussed with the  patient, including how to protect the general public from exposure.  There were no barriers to communication.  Written consent was  obtained.  The patient then received a capsule containing the  radiopharmaceutical.  The patient will follow-up with the referring  physician.   Radiopharmaceutical: 31.1 mCi I-131 orally.   Comparison: Thyroid scan 09/09/2011.   IMPRESSION:  Radioactive iodine therapy for toxic multinodular goiter.   CT neck non-con 11/13/23 FINDINGS: Pharynx and larynx: Very motion degraded assessment due to coughing. There is submucosal thickening in the left supraglottic larynx which is low-density on series 8 and seemingly in the  paraglottic fat, likely the reported laryngocele. Some lucency at the right paraglottic fat level which is likely the contralateral laryngocele. Inadequate for detecting an underlying lesion given the motion and lack of contrast.   Salivary glands: Normal   Thyroid: Normal   Lymph nodes: None enlarged or heterogeneous detected.   Vascular: Scattered atheromatous calcification   Limited intracranial: Negative   Visualized orbits: Negative   Mastoids and visualized paranasal sinuses: Sclerotic wall thickening at the right maxillary sinus. Right maxillary antrostomy.   Skeleton: ACDF with solid arthrodesis at C4-C7.   Upper chest: Biapical pleural based scarring. MPRESSION: Noncontrast study which is very limited by motion artifact, including at the level of the larynx.   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 A small gas filled internal laryngocele is likely visualized on the right.     Assessment/Plan: No diagnosis found.  Chronic longstanding dysphonia which started more than 2 decades ago with more recent gradual worsening of voice ~ 2 yrs, with raspy and strained quality.  Per family significant change over time.  History of smoking for many decades quit 2 years ago.  Per record review some weight loss recently.  Summary of Video-Laryngeal-Stroboscopy: Limited exam due to following findings: Bilateral vocal fold atrophy, evidence of polypoid lesion along the left true vocal fold, which appears similar to Ranke's polypoid change, ball-valve effect with inspiration and  phonation Supraglottic: Two large cystic lesions which appear to occupy false folds left greater than right suspicious for bilateral laryngoceles.  Incomplete glottic closure due to presence of a bulky left vocal fold polyp, and vocal fold atrophy.  Supraglottic compression is present.  Unable to observe  consistent mucosal wave.  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  -Will investigate swallowing function following CT neck and consider getting swallow study in the future due to history of weight loss (MBS/esophagram)  -  2.  Chronic nasal congestion and postnasal drainage/suspected environmental allergies  -Evidence of significant nasal mucosal edema inferior turban hypertrophy and septal deviation with clear copious postnasal drainage in the nasopharynx -We discussed the importance of using a nasal spray and systemic antihistamine to help with nasal congestion - trial of Zyrtec 10 mg daily and Flonase 2 puffs b/l nares BID  She will return after imaging to discuss surgical interventions  Thank you for allowing me to participate in the care of this patient. Please do not hesitate to contact me with any questions or concerns.   Ashok Croon, MD Otolaryngology [facility] Phone: [phone] Fax: [phone]    12/01/2023, 9:12 AM

## 2023-12-06 ENCOUNTER — Encounter: Payer: Self-pay | Admitting: Family

## 2023-12-06 ENCOUNTER — Ambulatory Visit (INDEPENDENT_AMBULATORY_CARE_PROVIDER_SITE_OTHER): Payer: Medicare HMO | Admitting: Family

## 2023-12-06 VITALS — BP 144/79 | HR 60 | Temp 98.0°F | Ht 63.0 in | Wt 124.0 lb

## 2023-12-06 DIAGNOSIS — R490 Dysphonia: Secondary | ICD-10-CM

## 2023-12-06 DIAGNOSIS — N184 Chronic kidney disease, stage 4 (severe): Secondary | ICD-10-CM

## 2023-12-06 DIAGNOSIS — I10 Essential (primary) hypertension: Secondary | ICD-10-CM

## 2023-12-06 DIAGNOSIS — J449 Chronic obstructive pulmonary disease, unspecified: Secondary | ICD-10-CM

## 2023-12-06 DIAGNOSIS — Z01818 Encounter for other preprocedural examination: Secondary | ICD-10-CM

## 2023-12-06 NOTE — Patient Instructions (Signed)
Hypertension, Adult High blood pressure (hypertension) is when the force of blood pumping through the arteries is too strong. The arteries are the blood vessels that carry blood from the heart throughout the body. Hypertension forces the heart to work harder to pump blood and may cause arteries to become narrow or stiff. Untreated or uncontrolled hypertension can lead to a heart attack, heart failure, a stroke, kidney disease, and other problems. A blood pressure reading consists of a higher number over a lower number. Ideally, your blood pressure should be below 120/80. The first ("top") number is called the systolic pressure. It is a measure of the pressure in your arteries as your heart beats. The second ("bottom") number is called the diastolic pressure. It is a measure of the pressure in your arteries as the heart relaxes. What are the causes? The exact cause of this condition is not known. There are some conditions that result in high blood pressure. What increases the risk? Certain factors may make you more likely to develop high blood pressure. Some of these risk factors are under your control, including: Smoking. Not getting enough exercise or physical activity. Being overweight. Having too much fat, sugar, calories, or salt (sodium) in your diet. Drinking too much alcohol. Other risk factors include: Having a personal history of heart disease, diabetes, high cholesterol, or kidney disease. Stress. Having a family history of high blood pressure and high cholesterol. Having obstructive sleep apnea. Age. The risk increases with age. What are the signs or symptoms? High blood pressure may not cause symptoms. Very high blood pressure (hypertensive crisis) may cause: Headache. Fast or irregular heartbeats (palpitations). Shortness of breath. Nosebleed. Nausea and vomiting. Vision changes. Severe chest pain, dizziness, and seizures. How is this diagnosed? This condition is diagnosed by  measuring your blood pressure while you are seated, with your arm resting on a flat surface, your legs uncrossed, and your feet flat on the floor. The cuff of the blood pressure monitor will be placed directly against the skin of your upper arm at the level of your heart. Blood pressure should be measured at least twice using the same arm. Certain conditions can cause a difference in blood pressure between your right and left arms. If you have a high blood pressure reading during one visit or you have normal blood pressure with other risk factors, you may be asked to: Return on a different day to have your blood pressure checked again. Monitor your blood pressure at home for 1 week or longer. If you are diagnosed with hypertension, you may have other blood or imaging tests to help your health care provider understand your overall risk for other conditions. How is this treated? This condition is treated by making healthy lifestyle changes, such as eating healthy foods, exercising more, and reducing your alcohol intake. You may be referred for counseling on a healthy diet and physical activity. Your health care provider may prescribe medicine if lifestyle changes are not enough to get your blood pressure under control and if: Your systolic blood pressure is above 130. Your diastolic blood pressure is above 80. Your personal target blood pressure may vary depending on your medical conditions, your age, and other factors. Follow these instructions at home: Eating and drinking  Eat a diet that is high in fiber and potassium, and low in sodium, added sugar, and fat. An example of this eating plan is called the DASH diet. DASH stands for Dietary Approaches to Stop Hypertension. To eat this way: Eat   plenty of fresh fruits and vegetables. Try to fill one half of your plate at each meal with fruits and vegetables. Eat whole grains, such as whole-wheat pasta, brown rice, or whole-grain bread. Fill about one  fourth of your plate with whole grains. Eat or drink low-fat dairy products, such as skim milk or low-fat yogurt. Avoid fatty cuts of meat, processed or cured meats, and poultry with skin. Fill about one fourth of your plate with lean proteins, such as fish, chicken without skin, beans, eggs, or tofu. Avoid pre-made and processed foods. These tend to be higher in sodium, added sugar, and fat. Reduce your daily sodium intake. Many people with hypertension should eat less than 1,500 mg of sodium a day. Do not drink alcohol if: Your health care provider tells you not to drink. You are pregnant, may be pregnant, or are planning to become pregnant. If you drink alcohol: Limit how much you have to: 0-1 drink a day for women. 0-2 drinks a day for men. Know how much alcohol is in your drink. In the [place] one drink equals one 12 oz bottle of beer (355 mL), one 5 oz glass of wine (148 mL), or one 1 oz glass of hard liquor (44 mL). Lifestyle  Work with your health care provider to maintain a healthy body weight or to lose weight. Ask what an ideal weight is for you. Get at least 30 minutes of exercise that causes your heart to beat faster (aerobic exercise) most days of the week. Activities may include walking, swimming, or biking. Include exercise to strengthen your muscles (resistance exercise), such as Pilates or lifting weights, as part of your weekly exercise routine. Try to do these types of exercises for 30 minutes at least 3 days a week. Do not use any products that contain nicotine or tobacco. These products include cigarettes, chewing tobacco, and vaping devices, such as e-cigarettes. If you need help quitting, ask your health care provider. Monitor your blood pressure at home as told by your health care provider. Keep all follow-up visits. This is important. Medicines Take over-the-counter and prescription medicines only as told by your health care provider. Follow directions carefully. Blood  pressure medicines must be taken as prescribed. Do not skip doses of blood pressure medicine. Doing this puts you at risk for problems and can make the medicine less effective. Ask your health care provider about side effects or reactions to medicines that you should watch for. Contact a health care provider if you: Think you are having a reaction to a medicine you are taking. Have headaches that keep coming back (recurring). Feel dizzy. Have swelling in your ankles. Have trouble with your vision. Get help right away if you: Develop a severe headache or confusion. Have unusual weakness or numbness. Feel faint. Have severe pain in your chest or abdomen. Vomit repeatedly. Have trouble breathing. These symptoms may be an emergency. Get help right away. Call 911. Do not wait to see if the symptoms will go away. Do not drive yourself to the hospital. Summary Hypertension is when the force of blood pumping through your arteries is too strong. If this condition is not controlled, it may put you at risk for serious complications. Your personal target blood pressure may vary depending on your medical conditions, your age, and other factors. For most people, a normal blood pressure is less than 120/80. Hypertension is treated with lifestyle changes, medicines, or a combination of both. Lifestyle changes include losing weight, eating a healthy,   low-sodium diet, exercising more, and limiting alcohol. This information is not intended to replace advice given to you by your health care provider. Make sure you discuss any questions you have with your health care provider. Document Revised: 10/21/2021 Document Reviewed: 10/21/2021 Elsevier Patient Education  2024 [employer]  

## 2023-12-06 NOTE — Progress Notes (Signed)
Subjective:    Patient ID: Rita Wells, female    DOB: 09-01-1933, 89 y.o.   MRN: [id number]  Chief Complaint  Patient presents with   sugrical clerance    PT presents to the office today for surgical clearance for polypoid lesion along left true vocal fold.   ENT's note,"Chronic longstanding dysphonia which started more than 2 decades ago with more recent gradual worsening of voice ~ 2 yrs, with raspy and strained quality.  Per family significant change over time.  History of smoking for many decades quit 2 years ago.  Per record review some weight loss recently.  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  Incomplete glottic closure due to presence of a bulky left vocal fold polyp, and vocal fold atrophy.  Supraglottic compression is present.  Unable to observe consistent mucosal wave.  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  -Will investigate swallowing function following CT neck and consider  getting swallow study in the future due to history of weight loss (MBS/esophagram)   2.  Chronic nasal congestion and postnasal drainage/suspected environmental allergies  -Evidence of significant nasal mucosal edema inferior turban hypertrophy and septal deviation with clear copious postnasal drainage in the nasopharynx -We discussed the importance of using a nasal spray and systemic antihistamine to help with nasal congestion - trial of Zyrtec 10 mg daily and Flonase 2 puffs b/l nares BID   She will return after imaging to discuss surgical interventions"  CT showed, "Noncontrast study which is very limited by motion artifact, including at the level of the larynx.   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 A small gas filled internal laryngocele is likely visualized on the right."  She has COPD and quit smoking 2 years ago. Has intermittent SOB.  Hypertension This is a chronic problem. The current episode started more than 1 year ago. The problem has been waxing and waning since onset. The problem is uncontrolled. Associated symptoms include malaise/fatigue and shortness of breath. Pertinent negatives include no peripheral edema. The current treatment provides moderate improvement.      Review of Systems  Constitutional:  Positive for malaise/fatigue.  Respiratory:  Positive for shortness of breath.   All other systems reviewed and are negative.      Objective:   Physical Exam Vitals reviewed.  Constitutional:      General: She is not in acute distress.    Appearance: She is well-developed.     Comments: underweight  HENT:     Head: Normocephalic and atraumatic.  Comments: Hoarse voice Eyes:     Pupils: Pupils are equal, round, and reactive to light.  Neck:     Thyroid: No thyromegaly.  Cardiovascular:     Rate and Rhythm: Normal rate and regular rhythm.      Heart sounds: Normal heart sounds. No murmur heard. Pulmonary:     Effort: Pulmonary effort is normal. No respiratory distress.     Breath sounds: Normal breath sounds. No wheezing.  Abdominal:     General: Bowel sounds are normal. There is no distension.     Palpations: Abdomen is soft.     Tenderness: There is no abdominal tenderness.  Musculoskeletal:        General: No tenderness. Normal range of motion.     Cervical back: Normal range of motion and neck supple.  Skin:    General: Skin is warm and dry.  Neurological:     Mental Status: She is alert and oriented to person, place, and time.     Cranial Nerves: No cranial nerve deficit.     Deep Tendon Reflexes: Reflexes are normal and symmetric.  Psychiatric:        Behavior: Behavior normal.        Thought Content: Thought content normal.        Judgment: Judgment normal.       BP (!) 144/79   Pulse 60   Temp 98 F (36.7 C) (Temporal)   Ht 5\' 3"  (1.6 m)   Wt 124 lb (56.2 kg)   SpO2 98%   BMI 21.97 kg/m      Assessment & Plan:  Rita Wells comes in today with chief complaint of sugrical clerance    Diagnosis and orders addressed:  1. Pre-op exam - EKG 12-Lead - CBC with Differential/Platelet - BMP8+EGFR  2. Chronic obstructive pulmonary disease, unspecified COPD type (HCC) - CBC with Differential/Platelet - BMP8+EGFR  3. Hoarseness of voice - CBC with Differential/Platelet - BMP8+EGFR  4. Stage 4 chronic kidney disease (HCC) - CBC with Differential/Platelet - BMP8+EGFR  5. Primary hypertension - CBC with Differential/Platelet - BMP8+EGFR   Labs pending Pt is high risk given age, COPD, CKD, but benefit out weighs risk. I have completed paperwork and will fax back to ENT.  Diet and exercise encouraged  Follow up plan: Keep chronic follow up   Rita Rodney, FNP

## 2023-12-07 ENCOUNTER — Other Ambulatory Visit: Payer: Self-pay | Admitting: Family

## 2023-12-07 DIAGNOSIS — N184 Chronic kidney disease, stage 4 (severe): Secondary | ICD-10-CM

## 2023-12-07 LAB — BMP8+EGFR
BUN/Creatinine Ratio: 12 (ref 12–28)
BUN: 27 mg/dL (ref 8–27)
CO2: 21 mmol/L (ref 20–29)
Calcium: 9.9 mg/dL (ref 8.7–10.3)
Chloride: 100 mmol/L (ref 96–106)
Creatinine, Ser: 2.32 mg/dL — ABNORMAL HIGH (ref 0.57–1.00)
Glucose: 76 mg/dL (ref 70–99)
Potassium: 5 mmol/L (ref 3.5–5.2)
Sodium: 137 mmol/L (ref 134–144)
eGFR: 20 mL/min/{1.73_m2} — ABNORMAL LOW (ref >59)

## 2023-12-07 LAB — CBC WITH DIFFERENTIAL/PLATELET
Basophils Absolute: 0 10*3/uL (ref 0.0–0.2)
Basos: 0 %
EOS (ABSOLUTE): 0.6 10*3/uL — ABNORMAL HIGH (ref 0.0–0.4)
Eos: 7 %
Hematocrit: 42.5 % (ref 34.0–46.6)
Hemoglobin: 13.8 g/dL (ref 11.1–15.9)
Immature Grans (Abs): 0 10*3/uL (ref 0.0–0.1)
Immature Granulocytes: 0 %
Lymphocytes Absolute: 1.7 10*3/uL (ref 0.7–3.1)
Lymphs: 19 %
MCH: 28.9 pg (ref 26.6–33.0)
MCHC: 32.5 g/dL (ref 31.5–35.7)
MCV: 89 fL (ref 79–97)
Monocytes Absolute: 0.8 10*3/uL (ref 0.1–0.9)
Monocytes: 9 %
Neutrophils Absolute: 5.9 10*3/uL (ref 1.4–7.0)
Neutrophils: 65 %
Platelets: 250 10*3/uL (ref 150–450)
RBC: 4.78 x10E6/uL (ref 3.77–5.28)
RDW: 13.9 % (ref 11.7–15.4)
WBC: 9.1 10*3/uL (ref 3.4–10.8)

## 2023-12-08 ENCOUNTER — Telehealth: Payer: Self-pay | Admitting: Family

## 2023-12-08 NOTE — Telephone Encounter (Signed)
Daughter Terressa Koyanagi) wants to speak with Austin Miles. Returning her call. If Truddie Hidden can't answer her phone, leave a detailed message of what the call is about.

## 2023-12-09 NOTE — Telephone Encounter (Signed)
Patient aware and verbalized understanding. °

## 2023-12-16 ENCOUNTER — Telehealth (INDEPENDENT_AMBULATORY_CARE_PROVIDER_SITE_OTHER): Payer: Self-pay

## 2023-12-16 NOTE — Telephone Encounter (Signed)
Fax fron nephrology, surgical clearance, states that the patient is to hold lisinopril the day of surgery and restart 2-3 days post op.   10/04/23 lab work was also sent

## 2024-01-03 ENCOUNTER — Other Ambulatory Visit: Payer: Self-pay | Admitting: Family

## 2024-01-03 ENCOUNTER — Encounter (HOSPITAL_COMMUNITY): Payer: Self-pay

## 2024-01-03 ENCOUNTER — Other Ambulatory Visit: Payer: Self-pay

## 2024-01-03 DIAGNOSIS — I10 Essential (primary) hypertension: Secondary | ICD-10-CM

## 2024-01-03 NOTE — Telephone Encounter (Unsigned)
 Copied from CRM [id number]. Topic: Clinical - Prescription Issue >> Jan 03, 2024  1:37 PM Nestora J wrote: Reason for CRM: Pt took her last blood pressure medication on Saturday and doesn't know if she will be able to pick up a refill today because of transportation. Pt's daughter wants to know if the pt will be ok without the medication until tomorrow? Callback [phone]

## 2024-01-03 NOTE — Progress Notes (Signed)
 SDW call  Patient's daughter, Aurea Caldron was given pre-op instructions over the phone. She verbalized understanding of instructions provided.     PCP - Bari Learn, FNP Cardiologist - denies Pulmonary:    PPM/ICD - denies Device Orders - na Rep Notified - na   Chest x-ray - na EKG -  12/06/2023 Stress Test - ECHO -  Cardiac Cath -   Sleep Study/sleep apnea/CPAP: denies  Non-diabetic  Blood Thinner Instructions: denies Aspirin Instructions:denies   ERAS Protcol - NPO  Anesthesia review: No   Patient denies shortness of breath, fever, cough and chest pain over the phone call  Your procedure is scheduled on Wednesday January 05, 2024  Report to [facility] Main Entrance A at  0630  A.M., then check in with the Admitting office.  Call this number if you have problems the morning of surgery:  [phone]   If you have any questions prior to your surgery date call [phone]: Open Monday-Friday 8am-4pm If you experience any cold or flu symptoms such as cough, fever, chills, shortness of breath, etc. between now and your scheduled surgery, please notify us  at the above number    Remember:  Do not eat or drink after midnight the night before your surgery  Take these medicines the morning of surgery with A SIP OF WATER:  Zyrtec , synthroid , metoprolol , crestor   As needed: flonase   As of today, STOP taking any Aspirin (unless otherwise instructed by your surgeon) Aleve, Naproxen, Ibuprofen, Motrin, Advil, Goody's, BC's, all herbal medications, fish oil, and all vitamins.

## 2024-01-04 NOTE — Anesthesia Preprocedure Evaluation (Signed)
 Anesthesia Evaluation  Patient identified by MRN, date of birth, ID band Patient awake    Reviewed: Allergy & Precautions, H&P , NPO status , Patient's Chart, lab work & pertinent test results  Airway Mallampati: II  TM Distance: >3 FB Neck ROM: Full    Dental no notable dental hx.    Pulmonary COPD, former smoker   Pulmonary exam normal breath sounds clear to auscultation       Cardiovascular hypertension, Normal cardiovascular exam Rhythm:Regular Rate:Normal     Neuro/Psych  PSYCHIATRIC DISORDERS Anxiety     negative neurological ROS     GI/Hepatic negative GI ROS, Neg liver ROS,,,  Endo/Other  Hypothyroidism    Renal/GU CRFRenal disease  negative genitourinary   Musculoskeletal  (+) Arthritis ,    Abdominal   Peds negative pediatric ROS (+)  Hematology negative hematology ROS (+)   Anesthesia Other Findings Larngocele  Reproductive/Obstetrics negative OB ROS                              Anesthesia Physical Anesthesia Plan  ASA: 3  Anesthesia Plan: General   Post-op Pain Management: Tylenol  PO (pre-op)*   Induction: Intravenous  PONV Risk Score and Plan: 3 and Ondansetron , Dexamethasone  and Treatment may vary due to age or medical condition  Airway Management Planned: Oral ETT and Video Laryngoscope Planned  Additional Equipment:   Intra-op Plan:   Post-operative Plan: Extubation in OR  Informed Consent: I have reviewed the patients History and Physical, chart, labs and discussed the procedure including the risks, benefits and alternatives for the proposed anesthesia with the patient or authorized representative who has indicated his/her understanding and acceptance.     Dental advisory given  Plan Discussed with: CRNA  Anesthesia Plan Comments:          Anesthesia Quick Evaluation

## 2024-01-05 ENCOUNTER — Observation Stay (HOSPITAL_COMMUNITY)
Admission: RE | Admit: 2024-01-05 | Discharge: 2024-01-06 | Disposition: A | Discharge status: Home / Self Care | Payer: Medicare HMO | Attending: Otolaryngology | Admitting: Otolaryngology

## 2024-01-05 ENCOUNTER — Observation Stay (HOSPITAL_COMMUNITY): Payer: Medicare HMO

## 2024-01-05 ENCOUNTER — Encounter (HOSPITAL_COMMUNITY)
Admission: RE | Disposition: A | Discharge status: Home / Self Care | Payer: Self-pay | Source: Home / Self Care | Attending: Otolaryngology

## 2024-01-05 ENCOUNTER — Ambulatory Visit (HOSPITAL_COMMUNITY): Payer: Self-pay | Admitting: Certified Registered Nurse Anesthetist

## 2024-01-05 ENCOUNTER — Other Ambulatory Visit: Payer: Self-pay

## 2024-01-05 ENCOUNTER — Encounter (HOSPITAL_COMMUNITY): Payer: Self-pay

## 2024-01-05 ENCOUNTER — Ambulatory Visit (HOSPITAL_BASED_OUTPATIENT_CLINIC_OR_DEPARTMENT_OTHER): Payer: Self-pay | Admitting: Certified Registered Nurse Anesthetist

## 2024-01-05 DIAGNOSIS — J383 Other diseases of vocal cords: Secondary | ICD-10-CM | POA: Diagnosis not present

## 2024-01-05 DIAGNOSIS — J387 Other diseases of larynx: Secondary | ICD-10-CM | POA: Diagnosis not present

## 2024-01-05 DIAGNOSIS — Q313 Laryngocele: Secondary | ICD-10-CM

## 2024-01-05 DIAGNOSIS — Z87891 Personal history of nicotine dependence: Secondary | ICD-10-CM | POA: Diagnosis not present

## 2024-01-05 DIAGNOSIS — Z9181 History of falling: Secondary | ICD-10-CM | POA: Diagnosis not present

## 2024-01-05 DIAGNOSIS — I10 Essential (primary) hypertension: Secondary | ICD-10-CM

## 2024-01-05 DIAGNOSIS — Z79899 Other long term (current) drug therapy: Secondary | ICD-10-CM | POA: Insufficient documentation

## 2024-01-05 DIAGNOSIS — N189 Chronic kidney disease, unspecified: Secondary | ICD-10-CM | POA: Diagnosis not present

## 2024-01-05 DIAGNOSIS — J381 Polyp of vocal cord and larynx: Secondary | ICD-10-CM | POA: Diagnosis not present

## 2024-01-05 DIAGNOSIS — J449 Chronic obstructive pulmonary disease, unspecified: Secondary | ICD-10-CM

## 2024-01-05 DIAGNOSIS — J382 Nodules of vocal cords: Secondary | ICD-10-CM | POA: Diagnosis not present

## 2024-01-05 DIAGNOSIS — R490 Dysphonia: Secondary | ICD-10-CM | POA: Diagnosis not present

## 2024-01-05 DIAGNOSIS — M25559 Pain in unspecified hip: Secondary | ICD-10-CM

## 2024-01-05 DIAGNOSIS — E039 Hypothyroidism, unspecified: Secondary | ICD-10-CM | POA: Diagnosis not present

## 2024-01-05 DIAGNOSIS — M25551 Pain in right hip: Secondary | ICD-10-CM | POA: Diagnosis not present

## 2024-01-05 DIAGNOSIS — I129 Hypertensive chronic kidney disease with stage 1 through stage 4 chronic kidney disease, or unspecified chronic kidney disease: Secondary | ICD-10-CM | POA: Diagnosis not present

## 2024-01-05 DIAGNOSIS — M47816 Spondylosis without myelopathy or radiculopathy, lumbar region: Secondary | ICD-10-CM | POA: Diagnosis not present

## 2024-01-05 HISTORY — PX: MICROLARYNGOSCOPY WITH CO2 LASER AND EXCISION OF VOCAL CORD LESION: SHX5970

## 2024-01-05 HISTORY — PX: RIGID BRONCHOSCOPY: SHX5069

## 2024-01-05 HISTORY — DX: Unspecified osteoarthritis, unspecified site: M19.90

## 2024-01-05 LAB — CBC
HCT: 41.5 % (ref 36.0–46.0)
Hemoglobin: 14 g/dL (ref 12.0–15.0)
MCH: 29.1 pg (ref 26.0–34.0)
MCHC: 33.7 g/dL (ref 30.0–36.0)
MCV: 86.3 fL (ref 80.0–100.0)
Platelets: 181 10*3/uL (ref 150–400)
RBC: 4.81 MIL/uL (ref 3.87–5.11)
RDW: 13.6 % (ref 11.5–15.5)
WBC: 10.8 10*3/uL — ABNORMAL HIGH (ref 4.0–10.5)
nRBC: 0 % (ref 0.0–0.2)

## 2024-01-05 LAB — CREATININE, SERUM
Creatinine, Ser: 2.41 mg/dL — ABNORMAL HIGH (ref 0.44–1.00)
GFR, Estimated: 19 mL/min — ABNORMAL LOW (ref >60)

## 2024-01-05 SURGERY — BRONCHOSCOPY, RIGID
Anesthesia: General | Site: Throat

## 2024-01-05 MED ORDER — METOPROLOL SUCCINATE ER 100 MG PO TB24
100.0000 mg | ORAL_TABLET | Freq: Every day | ORAL | Status: DC
  Administered 2024-01-06: 100 mg via ORAL
  Filled 2024-01-05 (×2): qty 1

## 2024-01-05 MED ORDER — LIDOCAINE 2% (20 MG/ML) 5 ML SYRINGE
INTRAMUSCULAR | Status: AC
  Filled 2024-01-05: qty 5

## 2024-01-05 MED ORDER — FENTANYL CITRATE (PF) 250 MCG/5ML IJ SOLN
INTRAMUSCULAR | Status: AC
  Filled 2024-01-05: qty 5

## 2024-01-05 MED ORDER — ONDANSETRON HCL 4 MG/2ML IJ SOLN: 4.0000 mg | INTRAMUSCULAR | Status: DC | PRN

## 2024-01-05 MED ORDER — SULFAMETHOXAZOLE-TRIMETHOPRIM 800-160 MG PO TABS
1.0000 | ORAL_TABLET | Freq: Two times a day (BID) | ORAL | Status: DC
  Administered 2024-01-05: 1 via ORAL
  Filled 2024-01-05: qty 1

## 2024-01-05 MED ORDER — LIDOCAINE 2% (20 MG/ML) 5 ML SYRINGE
INTRAMUSCULAR | Status: DC | PRN
  Administered 2024-01-05: 60 mg via INTRAVENOUS

## 2024-01-05 MED ORDER — EPINEPHRINE HCL (NASAL) 0.1 % NA SOLN
NASAL | Status: AC
  Filled 2024-01-05: qty 30

## 2024-01-05 MED ORDER — MORPHINE SULFATE (PF) 2 MG/ML IV SOLN
2.0000 mg | INTRAVENOUS | Status: DC | PRN
Start: 2024-01-05 — End: 2024-01-06

## 2024-01-05 MED ORDER — FAMOTIDINE 20 MG PO TABS
20.0000 mg | ORAL_TABLET | Freq: Two times a day (BID) | ORAL | Status: DC
  Filled 2024-01-05: qty 1

## 2024-01-05 MED ORDER — FENTANYL CITRATE (PF) 250 MCG/5ML IJ SOLN
INTRAMUSCULAR | Status: DC | PRN
  Administered 2024-01-05: 50 ug via INTRAVENOUS

## 2024-01-05 MED ORDER — FAMOTIDINE 20 MG PO TABS
20.0000 mg | ORAL_TABLET | Freq: Every day | ORAL | Status: DC
  Administered 2024-01-06: 20 mg via ORAL
  Filled 2024-01-05: qty 1

## 2024-01-05 MED ORDER — ONDANSETRON HCL 4 MG/2ML IJ SOLN
INTRAMUSCULAR | Status: DC | PRN
  Administered 2024-01-05: 4 mg via INTRAVENOUS

## 2024-01-05 MED ORDER — DEXAMETHASONE SODIUM PHOSPHATE 10 MG/ML IJ SOLN
INTRAMUSCULAR | Status: AC
  Filled 2024-01-05: qty 1

## 2024-01-05 MED ORDER — DEXAMETHASONE SODIUM PHOSPHATE 4 MG/ML IJ SOLN
4.0000 mg | Freq: Three times a day (TID) | INTRAMUSCULAR | Status: AC
  Administered 2024-01-05 – 2024-01-06 (×2): 4 mg via INTRAVENOUS
  Filled 2024-01-05 (×2): qty 1

## 2024-01-05 MED ORDER — ENSURE ENLIVE PO LIQD: 237.0000 mL | Freq: Two times a day (BID) | ORAL | Status: DC

## 2024-01-05 MED ORDER — SUGAMMADEX SODIUM 200 MG/2ML IV SOLN
INTRAVENOUS | Status: DC | PRN
  Administered 2024-01-05 (×2): 100 mg via INTRAVENOUS

## 2024-01-05 MED ORDER — DROPERIDOL 2.5 MG/ML IJ SOLN: 0.6250 mg | Freq: Once | INTRAMUSCULAR | Status: DC | PRN

## 2024-01-05 MED ORDER — LEVOTHYROXINE SODIUM 88 MCG PO TABS
88.0000 ug | ORAL_TABLET | Freq: Every day | ORAL | Status: DC
Start: 2024-01-05 — End: 2024-01-06
  Administered 2024-01-06: 88 ug via ORAL
  Filled 2024-01-05 (×2): qty 1

## 2024-01-05 MED ORDER — PROPOFOL 10 MG/ML IV BOLUS
INTRAVENOUS | Status: DC | PRN
  Administered 2024-01-05: 80 mg via INTRAVENOUS

## 2024-01-05 MED ORDER — DEXAMETHASONE SODIUM PHOSPHATE 10 MG/ML IJ SOLN
INTRAMUSCULAR | Status: DC | PRN
  Administered 2024-01-05: 10 mg via INTRAVENOUS

## 2024-01-05 MED ORDER — LIDOCAINE-EPINEPHRINE 1 %-1:100000 IJ SOLN: INTRAMUSCULAR | Status: DC | PRN

## 2024-01-05 MED ORDER — ROCURONIUM BROMIDE 10 MG/ML (PF) SYRINGE
PREFILLED_SYRINGE | INTRAVENOUS | Status: DC | PRN
  Administered 2024-01-05: 50 mg via INTRAVENOUS

## 2024-01-05 MED ORDER — FENTANYL CITRATE (PF) 100 MCG/2ML IJ SOLN
25.0000 ug | INTRAMUSCULAR | Status: DC | PRN
  Administered 2024-01-05 (×4): 25 ug via INTRAVENOUS

## 2024-01-05 MED ORDER — ORAL CARE MOUTH RINSE: 15.0000 mL | Freq: Once | OROMUCOSAL | Status: AC

## 2024-01-05 MED ORDER — PHENYLEPHRINE HCL-NACL 20-0.9 MG/250ML-% IV SOLN
INTRAVENOUS | Status: DC | PRN
  Administered 2024-01-05: 50 ug/min via INTRAVENOUS

## 2024-01-05 MED ORDER — AMOXICILLIN-POT CLAVULANATE 500-125 MG PO TABS
1.0000 | ORAL_TABLET | Freq: Every day | ORAL | Status: DC
  Administered 2024-01-05: 1 via ORAL
  Filled 2024-01-05 (×3): qty 1

## 2024-01-05 MED ORDER — FENTANYL CITRATE (PF) 100 MCG/2ML IJ SOLN
INTRAMUSCULAR | Status: AC
  Filled 2024-01-05: qty 2

## 2024-01-05 MED ORDER — SODIUM CHLORIDE 0.9 % IV SOLN: INTRAVENOUS | Status: DC

## 2024-01-05 MED ORDER — ROCURONIUM BROMIDE 10 MG/ML (PF) SYRINGE
PREFILLED_SYRINGE | INTRAVENOUS | Status: AC
  Filled 2024-01-05: qty 10

## 2024-01-05 MED ORDER — EPINEPHRINE HCL (NASAL) 0.1 % NA SOLN
NASAL | Status: DC | PRN
  Administered 2024-01-05: 1 [drp] via TOPICAL

## 2024-01-05 MED ORDER — LISINOPRIL 20 MG PO TABS
20.0000 mg | ORAL_TABLET | Freq: Every day | ORAL | Status: DC
  Administered 2024-01-06: 20 mg via ORAL
  Filled 2024-01-05 (×2): qty 1

## 2024-01-05 MED ORDER — OXYMETAZOLINE HCL 0.05 % NA SOLN
NASAL | Status: AC
  Filled 2024-01-05: qty 30

## 2024-01-05 MED ORDER — ACETAMINOPHEN-CODEINE 300-30 MG PO TABS: 1.0000 | ORAL_TABLET | ORAL | Status: DC | PRN

## 2024-01-05 MED ORDER — AMOXICILLIN-POT CLAVULANATE 875-125 MG PO TABS: 1.0000 | ORAL_TABLET | Freq: Two times a day (BID) | ORAL | Status: DC

## 2024-01-05 MED ORDER — ONDANSETRON HCL 4 MG/2ML IJ SOLN
INTRAMUSCULAR | Status: AC
  Filled 2024-01-05: qty 2

## 2024-01-05 MED ORDER — CHLORHEXIDINE GLUCONATE 0.12 % MT SOLN
15.0000 mL | Freq: Once | OROMUCOSAL | Status: AC
  Administered 2024-01-05: 15 mL via OROMUCOSAL
  Filled 2024-01-05: qty 15

## 2024-01-05 MED ORDER — ACETAMINOPHEN 650 MG RE SUPP: 650.0000 mg | RECTAL | Status: DC | PRN

## 2024-01-05 MED ORDER — ENOXAPARIN SODIUM 30 MG/0.3ML IJ SOSY
30.0000 mg | PREFILLED_SYRINGE | INTRAMUSCULAR | Status: DC
  Administered 2024-01-06: 30 mg via SUBCUTANEOUS
  Filled 2024-01-05: qty 0.3

## 2024-01-05 MED ORDER — OXYCODONE HCL 5 MG PO TABS: 5.0000 mg | ORAL_TABLET | Freq: Once | ORAL | Status: DC | PRN

## 2024-01-05 MED ORDER — 0.9 % SODIUM CHLORIDE (POUR BTL) OPTIME
TOPICAL | Status: DC | PRN
  Administered 2024-01-05: 1000 mL

## 2024-01-05 MED ORDER — OXYCODONE HCL 5 MG/5ML PO SOLN: 5.0000 mg | Freq: Once | ORAL | Status: DC | PRN

## 2024-01-05 MED ORDER — ACETAMINOPHEN 160 MG/5ML PO SOLN: 650.0000 mg | ORAL | Status: DC | PRN

## 2024-01-05 MED ORDER — ACETAMINOPHEN 10 MG/ML IV SOLN: 1000.0000 mg | Freq: Once | INTRAVENOUS | Status: DC | PRN

## 2024-01-05 MED ORDER — LIDOCAINE-EPINEPHRINE 1 %-1:100000 IJ SOLN
INTRAMUSCULAR | Status: AC
  Filled 2024-01-05: qty 1

## 2024-01-05 MED ORDER — OXYMETAZOLINE HCL 0.05 % NA SOLN
NASAL | Status: DC | PRN
  Administered 2024-01-05: 1 via TOPICAL

## 2024-01-05 MED ORDER — ONDANSETRON HCL 4 MG PO TABS: 4.0000 mg | ORAL_TABLET | ORAL | Status: DC | PRN

## 2024-01-05 SURGICAL SUPPLY — 30 items
BAG COUNTER SPONGE SURGICOUNT (BAG) ×3 IMPLANT
CANISTER SUCT 3000ML PPV (MISCELLANEOUS) ×3 IMPLANT
CNTNR URN SCR LID CUP LEK RST (MISCELLANEOUS) IMPLANT
COVER BACK TABLE 60X90IN (DRAPES) ×3 IMPLANT
COVER MAYO STAND STRL (DRAPES) ×3 IMPLANT
DRAPE HALF SHEET 40X57 (DRAPES) ×3 IMPLANT
DRSG TELFA 3X8 NADH STRL (GAUZE/BANDAGES/DRESSINGS) ×3 IMPLANT
GAUZE SPONGE 4X4 12PLY STRL (GAUZE/BANDAGES/DRESSINGS) ×3 IMPLANT
GLOVE BIO SURGEON STRL SZ 6 (GLOVE) ×3 IMPLANT
GOWN STRL REUS W/TWL LRG LVL3 (GOWN DISPOSABLE) ×6 IMPLANT
GUARD TEETH (MISCELLANEOUS) IMPLANT
KIT BASIN OR (CUSTOM PROCEDURE TRAY) ×3 IMPLANT
KIT PROLARN PLUS GEL W/NDL (Prosthesis and Implant ENT) IMPLANT
KIT TURNOVER KIT B (KITS) ×3 IMPLANT
MARKER SKIN DUAL TIP RULER LAB (MISCELLANEOUS) IMPLANT
NDL HYPO 25GX1X1/2 BEV (NEEDLE) IMPLANT
NEEDLE HYPO 25GX1X1/2 BEV (NEEDLE) ×2 IMPLANT
NS IRRIG 1000ML POUR BTL (IV SOLUTION) ×3 IMPLANT
PAD ARMBOARD 7.5X6 YLW CONV (MISCELLANEOUS) ×6 IMPLANT
PATTIES SURGICAL .5 X.5 (GAUZE/BANDAGES/DRESSINGS) ×3 IMPLANT
PATTIES SURGICAL .5X1.5 (GAUZE/BANDAGES/DRESSINGS) IMPLANT
POSITIONER HEAD DONUT 9IN (MISCELLANEOUS) ×3 IMPLANT
SET COLLECT BLD 25X3/4 12 (NEEDLE) IMPLANT
SOL ANTI FOG 6CC (MISCELLANEOUS) ×3 IMPLANT
SURGILUBE 2OZ TUBE FLIPTOP (MISCELLANEOUS) ×3 IMPLANT
SYR 3ML LL SCALE MARK (SYRINGE) IMPLANT
SYR TB 1ML LUER SLIP (SYRINGE) IMPLANT
TOWEL GREEN STERILE FF (TOWEL DISPOSABLE) ×6 IMPLANT
TUBE CONNECTING 12X1/4 (SUCTIONS) ×6 IMPLANT
WATER STERILE IRR 1000ML POUR (IV SOLUTION) ×3 IMPLANT

## 2024-01-05 NOTE — Anesthesia Postprocedure Evaluation (Signed)
 Anesthesia Post Note  Patient: Rita Wells  Procedure(s) Performed: RIGID BRONCHOSCOPY (Throat) Direct LARYNGOSCOPY WITH CO2 LASER AND EXCISION OF bilateral larygoceles (Bilateral)     Patient location during evaluation: PACU Anesthesia Type: General Level of consciousness: awake and alert Pain management: pain level controlled Vital Signs Assessment: post-procedure vital signs reviewed and stable Respiratory status: spontaneous breathing, nonlabored ventilation, respiratory function stable and patient connected to nasal cannula oxygen Cardiovascular status: blood pressure returned to baseline and stable Postop Assessment: no apparent nausea or vomiting Anesthetic complications: no   No notable events documented.  Last Vitals:  Vitals:   01/05/24 1300 01/05/24 1314  BP: (!) 164/75 (!) 183/79  Pulse: (!) 53 60  Resp: 11 15  Temp: (!) 36.2 C (!) 36.3 C  SpO2: 96% 100%    Last Pain:  Vitals:   01/05/24 1314  TempSrc: Oral  PainSc:                  Thom JONELLE Peoples

## 2024-01-05 NOTE — H&P (Signed)
 Rita Wells is an 89 y.o. female.    Chief Complaint:  dysphonia and bilateral laryngoceles, vocal fold lesion  HPI: Patient presents today for planned elective procedure.  She denies any interval change in history since office visit on 11/30/24.  Past Medical History:  Diagnosis Date   Arthritis    Glaucoma    Hyperlipidemia    Hypertension    Toxic goiter     Past Surgical History:  Procedure Laterality Date   CERVICAL FUSION     TUBAL LIGATION      Family History  Problem Relation Age of Onset   Heart disease Mother        MI   Heart attack Mother    CVA Father    Stroke Father    Cancer Sister        OVARIAN   Heart disease Brother        MI   Heart attack Brother    Cancer Brother    Heart disease Brother        MI   Cancer Sister    Heart disease Sister     Social History:  reports that she quit smoking about 45 years ago. Her smoking use included cigarettes. She has never used smokeless tobacco. She reports that she does not drink alcohol and does not use drugs.  Allergies:  Allergies  Allergen Reactions   Azor [Amlodipine-Olmesartan]     Unknown Reaction     Medications Prior to Admission  Medication Sig Dispense Refill   cetirizine  (ZYRTEC ) 10 MG tablet Take 1 tablet (10 mg total) by mouth daily. 90 tablet 11   feeding supplement (ENSURE ENLIVE / ENSURE PLUS) LIQD Take 237 mLs by mouth 2 (two) times daily between meals. (Patient taking differently: Take 237 mLs by mouth 2 (two) times daily as needed (supplement).) 23700 mL 1   levothyroxine  (SYNTHROID ) 88 MCG tablet TAKE ONE TABLET BY MOUTH DAILY 90 tablet 2   lisinopril  (ZESTRIL ) 20 MG tablet Take 1 tablet (20 mg total) by mouth daily. 90 tablet 3   metoprolol  succinate (TOPROL -XL) 100 MG 24 hr tablet TAKE 1 TABLET DAILY WITH OR IMMEDIATELY AFTER A MEAL 90 tablet 0   rosuvastatin  (CRESTOR ) 20 MG tablet TAKE 1 TABLET DAILY 90 tablet 0   fluticasone  (FLONASE ) 50 MCG/ACT nasal spray Place 2 sprays  into both nostrils 2 (two) times daily. (Patient taking differently: Place 2 sprays into both nostrils 2 (two) times daily as needed for allergies or rhinitis.) 16 g 6    No results found for this or any previous visit (from the past 48 hours). No results found.  ROS: Review of Systems  Constitutional:  Negative for chills and fever.  HENT:  Negative for hearing loss and tinnitus.   Eyes:  Negative for double vision.  Respiratory:  Negative for cough and hemoptysis.   Cardiovascular:  Negative for chest pain and palpitations.  Gastrointestinal:  Negative for nausea and vomiting.  Genitourinary:  Negative for flank pain.  Musculoskeletal:  Negative for myalgias.  Skin:  Negative for rash.  Neurological:  Negative for dizziness.  Psychiatric/Behavioral:  Negative for memory loss.     Blood pressure (!) 150/96, pulse (!) 56, temperature 98 F (36.7 C), resp. rate 18, height 5' 3 (1.6 m), weight 54.4 kg, SpO2 98%.  PHYSICAL EXAM: Physical Exam Constitutional:      Appearance: Normal appearance.  HENT:     Head: Normocephalic and atraumatic.     Right Ear:  External ear normal.     Left Ear: External ear normal.     Nose: Nose normal.     Mouth/Throat:     Mouth: Mucous membranes are dry.  Eyes:     Extraocular Movements: Extraocular movements intact.  Cardiovascular:     Rate and Rhythm: Normal rate.     Pulses: Normal pulses.  Pulmonary:     Effort: No respiratory distress.     Breath sounds: No stridor.  Abdominal:     Palpations: Abdomen is soft.  Musculoskeletal:        General: No swelling or deformity.  Skin:    Findings: No rash.  Neurological:     Mental Status: She is alert and oriented to person, place, and time.  Psychiatric:        Mood and Affect: Mood normal.       Assessment/Plan Dysphonia Bilateral Laryngocele Left vocal fold lesion Bilateral vocal fold atrophy  - will proceed with DML bronchoscopy CO2 laser excision of bilateral laryngoceles  and left vocal fold lesion, possible bilateral vocal fold injection augmentation.    Jasia Hiltunen 01/05/2024, 8:24 AM

## 2024-01-05 NOTE — Consult Note (Signed)
 Initial Consultation Note   Patient: Rita Wells FMW:[id number] DOB: 1933/07/07 PCP: Lavell Bari LABOR, FNP DOA: 01/05/2024 DOS: the patient was seen and examined on 01/05/2024 Primary service: Okey Burns, MD  Referring physician: Dr. Okey Reason for consult: hip pain.  Assessment/Plan: Assessment and Plan: Hip pain Right-sided, subacute, going on for approximately 4 weeks.  In close temporal relationship to a fall at that time.  Given ongoing pain that has not subsided after 4 weeks of observation/conservative management.  Will proceed with an x-ray of the pelvis and right femur.  I anticipate that if no acute finding on the x-ray, patient may proceed with further workup at outpatient clinic.       [facility] will continue to follow the patient.  HPI: Rita Wells is a 89 y.o. female with past medical history of Arthritis.  History from patient, as well as from the daughter at the bedside.  Patient apparently had a mechanical fall at home approximately 4 weeks ago.  Patient was since then able to get back up and has continued to maintain ambulation.  However has been reporting progressive right hip area pain posteriorly that she reports is worse with weightbearing or ambulation.  It is not present with a resting or sitting.  There is no distal weakness reported no back pain reported and no radiation of the pain.  There is no numbness or tingling sensation of the distal extremity.  Patient is currently admitted to the ENT service for laryngocele excision.  Which has apparently been successfully performed.  However postoperatively it was noted that the patient was reporting some pain in the right hip and having difficulty walking.  A physical therapy evaluation was performed and the patient was actually able to walk approximately 80 feet with a minimal supervision for steadying by staff member.  Patient is advised to return home with home health.  Medical evaluation is  sought.  Patient at this time offers no complaints besides throat pain  Review of Systems: As mentioned in the history of present illness. All other systems reviewed and are negative. Past Medical History:  Diagnosis Date   Arthritis    Glaucoma    Hyperlipidemia    Hypertension    Toxic goiter    Past Surgical History:  Procedure Laterality Date   CERVICAL FUSION     TUBAL LIGATION     Social History:  reports that she quit smoking about 45 years ago. Her smoking use included cigarettes. She has never used smokeless tobacco. She reports that she does not drink alcohol and does not use drugs.  Allergies  Allergen Reactions   Azor [Amlodipine-Olmesartan]     Unknown Reaction     Family History  Problem Relation Age of Onset   Heart disease Mother        MI   Heart attack Mother    CVA Father    Stroke Father    Cancer Sister        OVARIAN   Heart disease Brother        MI   Heart attack Brother    Cancer Brother    Heart disease Brother        MI   Cancer Sister    Heart disease Sister     Prior to Admission medications   Medication Sig Start Date End Date Taking? Authorizing Provider  cetirizine  (ZYRTEC ) 10 MG tablet Take 1 tablet (10 mg total) by mouth daily. 10/19/23  Yes Soldatova, Liuba, MD  feeding supplement (ENSURE ENLIVE / ENSURE PLUS) LIQD Take 237 mLs by mouth 2 (two) times daily between meals. Patient taking differently: Take 237 mLs by mouth 2 (two) times daily as needed (supplement). 06/28/23  Yes Hawks, Bari A, FNP  levothyroxine  (SYNTHROID ) 88 MCG tablet TAKE ONE TABLET BY MOUTH DAILY 10/20/23  Yes Hawks, Christy A, FNP  lisinopril  (ZESTRIL ) 20 MG tablet Take 1 tablet (20 mg total) by mouth daily. 03/08/23  Yes Hawks, Bari A, FNP  metoprolol  succinate (TOPROL -XL) 100 MG 24 hr tablet TAKE 1 TABLET DAILY WITH OR IMMEDIATELY AFTER A MEAL 01/03/24  Yes Hawks, Christy A, FNP  rosuvastatin  (CRESTOR ) 20 MG tablet TAKE 1 TABLET DAILY 10/20/23  Yes Hawks,  Christy A, FNP  fluticasone  (FLONASE ) 50 MCG/ACT nasal spray Place 2 sprays into both nostrils 2 (two) times daily. Patient taking differently: Place 2 sprays into both nostrils 2 (two) times daily as needed for allergies or rhinitis. 10/19/23   Okey Burns, MD    Physical Exam: Vitals:   01/05/24 1230 01/05/24 1300 01/05/24 1314 01/05/24 1547  BP: (!) 160/94 (!) 164/75 (!) 183/79 (!) 160/94  Pulse: 61 (!) 53 60 62  Resp: 18 11 15 18   Temp:  (!) 97.2 F (36.2 C) (!) 97.4 F (36.3 C) (!) 97.5 F (36.4 C)  TempSrc:   Oral Oral  SpO2: 96% 96% 100% 97%  Weight:      Height:       General: Thin lady appears to be in no distress, family at bedside Respiratory exam: Bilateral intravesicular Cardiovascular exam S1-S2 normal Abdomen all quadrant soft nontender Extremities warm without edema Patient was able to stand up demonstrated good flexion and extension strength of the right hip and bilateral hip.  Distal function intact.  No direct tenderness to gluteal palpation on the right. Patient was stood up and reports pain in the right gluteal area deep.  However able to maintain good strength with standing. Data Reviewed:   Labs on Admission:  Results for orders placed or performed during the hospital encounter of 01/05/24 (from the past 24 hours)  CBC     Status: Abnormal   Collection Time: 01/05/24  2:01 PM  Result Value Ref Range   WBC 10.8 (H) 4.0 - 10.5 K/uL   RBC 4.81 3.87 - 5.11 MIL/uL   Hemoglobin 14.0 12.0 - 15.0 g/dL   HCT 58.4 63.9 - 53.9 %   MCV 86.3 80.0 - 100.0 fL   MCH 29.1 26.0 - 34.0 pg   MCHC 33.7 30.0 - 36.0 g/dL   RDW 86.3 88.4 - 84.4 %   Platelets 181 150 - 400 K/uL   nRBC 0.0 0.0 - 0.2 %  Creatinine, serum     Status: Abnormal   Collection Time: 01/05/24  2:01 PM  Result Value Ref Range   Creatinine, Ser 2.41 (H) 0.44 - 1.00 mg/dL   GFR, Estimated 19 (L) >60 mL/min   Basic Metabolic Panel: Recent Labs  Lab 01/05/24 1401  CREATININE 2.41*   Liver  Function Tests: No results for input(s): AST, ALT, ALKPHOS, BILITOT, PROT, ALBUMIN in the last 168 hours. No results for input(s): LIPASE, AMYLASE in the last 168 hours. No results for input(s): AMMONIA in the last 168 hours. CBC: Recent Labs  Lab 01/05/24 1401  WBC 10.8*  HGB 14.0  HCT 41.5  MCV 86.3  PLT 181   Cardiac Enzymes: No results for input(s): CKTOTAL, CKMB, CKMBINDEX, TROPONINIHS in the last 168 hours.  BNP (last  3 results) No results for input(s): PROBNP in the last 8760 hours. CBG: No results for input(s): GLUCAP in the last 168 hours.  Radiological Exams on Admission:  No results found.     Family Communication: discussed with daughter at bedside. Primary team communication: discussed with primary attending about imaging, will order. Thank you very much for involving us  in the care of your patient.  Author: Jacqulyn Divine, MD 01/05/2024 6:20 PM  For on call review [email or web address].

## 2024-01-05 NOTE — Anesthesia Procedure Notes (Signed)
 Procedure Name: Intubation Date/Time: 01/05/2024 8:48 AM  Performed by: Clark Cuff A, CRNAPre-anesthesia Checklist: Patient identified, Emergency Drugs available, Suction available and Patient being monitored Patient Re-evaluated:Patient Re-evaluated prior to induction Oxygen Delivery Method: Circle System Utilized Preoxygenation: Pre-oxygenation with 100% oxygen Induction Type: IV induction Ventilation: Mask ventilation without difficulty Laryngoscope Size: Glidescope and 3 Grade View: Grade II Tube type: Oral Laser Tube: Laser Tube and Cuffed inflated with minimal occlusive pressure - saline Tube size: 6.0 mm Number of attempts: 1 Airway Equipment and Method: Stylet and Oral airway Placement Confirmation: ETT inserted through vocal cords under direct vision, positive ETCO2 and breath sounds checked- equal and bilateral Secured at: 24 cm Tube secured with: Tape Dental Injury: Teeth and Oropharynx as per pre-operative assessment

## 2024-01-05 NOTE — Assessment & Plan Note (Signed)
 Right-sided, subacute, going on for approximately 4 weeks.  In close temporal relationship to a fall at that time.  Given ongoing pain that has not subsided after 4 weeks of observation/conservative management.  Will proceed with an x-ray of the pelvis and right femur.  I anticipate that if no acute finding on the x-ray, patient may proceed with further workup at outpatient clinic.

## 2024-01-05 NOTE — Progress Notes (Addendum)
   01/05/24 1515  Mobility  Activity Ambulated with assistance in hallway  Level of Assistance Contact guard assist, steadying assist (MinA STS)  Assistive Device Other (Comment) (HHA)  Distance Ambulated (ft) 50 ft  Activity Response Tolerated fair  Mobility Referral Yes  Mobility visit 1 Mobility  Mobility Specialist Start Time (ACUTE ONLY) 1515  Mobility Specialist Stop Time (ACUTE ONLY) 1555  Mobility Specialist Time Calculation (min) (ACUTE ONLY) 40 min   Mobility Specialist: Progress Note  Pre-Mobility:SpO2 97% RA During Mobility: SpO2 94 % RA Post-Mobility: SpO2 100% RA  RN requested MS to assess pt's mobility - Pt agreeable to mobility session - received in bed. Required MinA for Bed Mobility and STS. CG for ambulation  with unstedy gait, c/o R hip pain throughout. Pt with LOB X1 after STS from bedside. Pt encouraged to sit in chair as much as possible and OOB. Returned to chair with all needs met - call bell within reach. Chair alarm on. Son, daughter, and family present.  Virgle Boards, BS Mobility Specialist Please contact via SecureChat or Rehab office at [phone].

## 2024-01-05 NOTE — Plan of Care (Signed)
  Problem: Elimination: Goal: Will not experience complications related to urinary retention Outcome: Progressing   Problem: Pain Management: Goal: General experience of comfort will improve Outcome: Progressing   Problem: Safety: Goal: Ability to remain free from injury will improve Outcome: Progressing   Problem: Skin Integrity: Goal: Risk for impaired skin integrity will decrease Outcome: Progressing

## 2024-01-05 NOTE — Evaluation (Signed)
 Physical Therapy Evaluation Patient Details Name: Rita Wells MRN: [id number] DOB: 1933/04/12 Today's Date: 01/05/2024  History of Present Illness  89 y.o. female presents to [facility]/04/23 for a rigid bronchoscopy and direct laryngoscopy w/ excision of L vocal fold poly/lesion and left laryngocele. PMHx: dysphonia, HTN, hyperlipidemia, arthritis, cervical fusion   Clinical Impression  Pt in recliner with family present upon arrival and agreeable to PT eval. Prior to admit, pt was independent with no AD. Pt reports one fall ~2 weeks prior after tripping over a garden hose in the yard. In today's session, pt was able to ambulate ~80 ft w/ no AD and CGA for steadying. Family reports PCP recommending using a RW, however, pt declines. Pt is at an increased falls risk with a history of falling and increased 5x STS score. Pt presents to therapy session with decreased LE strength, balance, and mobility. Pt would benefit from acute skilled PT to address functional impairments. Recommending post-acute HHPT to work on overall mobility, balance, and strengthening. Acute PT to follow.          If plan is discharge home, recommend the following: A little help with walking and/or transfers;Help with stairs or ramp for entrance;Assist for transportation   Can travel by private vehicle   Yes     Equipment Recommendations Other (comment) (will continue to assess)     Functional Status Assessment Patient has had a recent decline in their functional status and demonstrates the ability to make significant improvements in function in a reasonable and predictable amount of time.     Precautions / Restrictions Precautions Precautions: Fall Restrictions Weight Bearing Restrictions Per Provider Order: No      Mobility  Bed Mobility  General bed mobility comments: in recliner upon arrival    Transfers Overall transfer level: Needs assistance Equipment used: None Transfers: Sit to/from Stand Sit to  Stand: Contact guard assist  General transfer comment: CGA for safety    Ambulation/Gait Ambulation/Gait assistance: Contact guard assist Gait Distance (Feet): 80 Feet Assistive device: None Gait Pattern/deviations: Step-through pattern, Shuffle, Narrow base of support Gait velocity: decr    General Gait Details: short steps with narrow BOS. No LOB w/ no AD     Balance Overall balance assessment: Needs assistance, History of Falls Sitting-balance support: Bilateral upper extremity supported, Feet supported Sitting balance-Leahy Scale: Good     Standing balance support: No upper extremity supported Standing balance-Leahy Scale: Fair Standing balance comment: able to stand and ambulate w/ no AD    5 Time STS: 25.21 seconds        Pertinent Vitals/Pain Pain Assessment Pain Assessment: Faces Faces Pain Scale: Hurts little more Pain Location: R sacrum Pain Descriptors / Indicators: Aching, Discomfort Pain Intervention(s): Limited activity within patient's tolerance, Monitored during session, Repositioned    Home Living Family/patient expects to be discharged to:: Private residence Living Arrangements: Other relatives (niece) Available Help at Discharge: Family;Available PRN/intermittently (could be 24/7 if need be) Type of Home: House Home Access: Stairs to enter;Level entry Entrance Stairs-Rails: Can reach both;Right;Left Entrance Stairs-Number of Steps: 3   Home Layout: One level Home Equipment: Rollator (4 wheels) Additional Comments: has family close by    Prior Function Prior Level of Function : Independent/Modified Independent;History of Falls (last six months)        Mobility Comments: independent w/ no AD, fell ~2 weeks ago after tripping over a hose in the yard ADLs Comments: independent     Extremity/Trunk Assessment   Upper Extremity  Assessment Upper Extremity Assessment: Overall WFL for tasks assessed    Lower Extremity Assessment Lower Extremity  Assessment: Generalized weakness    Cervical / Trunk Assessment Cervical / Trunk Assessment: Neck Surgery  Communication   Communication Communication: Difficulty communicating thoughts/reduced clarity of speech Cueing Techniques: Verbal cues  Cognition Arousal: Alert Behavior During Therapy: WFL for tasks assessed/performed Overall Cognitive Status: Within Functional Limits for tasks assessed         General Comments General comments (skin integrity, edema, etc.): VSS on RA     PT Assessment Patient needs continued PT services  PT Problem List Decreased strength;Decreased activity tolerance;Decreased balance;Decreased mobility;Decreased knowledge of use of DME;Decreased safety awareness       PT Treatment Interventions DME instruction;Gait training;Stair training;Functional mobility training;Therapeutic activities;Therapeutic exercise;Balance training;Neuromuscular re-education;Patient/family education    PT Goals (Current goals can be found in the Care Plan section)  Acute Rehab PT Goals Patient Stated Goal: to go home PT Goal Formulation: With patient/family Time For Goal Achievement: 01/19/24 Potential to Achieve Goals: Good    Frequency Min 1X/week        AM-PAC PT 6 Clicks Mobility  Outcome Measure Help needed turning from your back to your side while in a flat bed without using bedrails?: None Help needed moving from lying on your back to sitting on the side of a flat bed without using bedrails?: A Little Help needed moving to and from a bed to a chair (including a wheelchair)?: A Little Help needed standing up from a chair using your arms (e.g., wheelchair or bedside chair)?: A Little Help needed to walk in hospital room?: A Little Help needed climbing 3-5 steps with a railing? : A Little 6 Click Score: 19    End of Session   Activity Tolerance: Patient tolerated treatment well Patient left: in chair;with call bell/phone within reach;with chair alarm  set;with family/visitor present Nurse Communication: Mobility status PT Visit Diagnosis: Unsteadiness on feet (R26.81);History of falling (Z91.81);Muscle weakness (generalized) (M62.81)    Time: 8396-8373 PT Time Calculation (min) (ACUTE ONLY): 23 min   Charges:   PT Evaluation $PT Eval Low Complexity: 1 Low   PT General Charges $$ ACUTE PT VISIT: 1 Visit        Kate ORN, PT, DPT Secure Chat Preferred  Rehab Office [phone]  Kate BRAVO Wendolyn 01/05/2024, 4:36 PM

## 2024-01-05 NOTE — Transfer of Care (Signed)
 Immediate Anesthesia Transfer of Care Note  Patient: Rita Wells  Procedure(s) Performed: RIGID BRONCHOSCOPY (Throat) Direct LARYNGOSCOPY WITH CO2 LASER AND EXCISION OF bilateral larygoceles (Bilateral)  Patient Location: PACU  Anesthesia Type:General  Level of Consciousness: sedated  Airway & Oxygen Therapy: Patient Spontanous Breathing and Patient connected to nasal cannula oxygen  Post-op Assessment: Report given to RN and Post -op Vital signs reviewed and stable  Post vital signs: Reviewed and stable  Last Vitals:  Vitals Value Taken Time  BP 170/66 01/05/24 1036  Temp    Pulse 63 01/05/24 1038  Resp 13 01/05/24 1038  SpO2 99 % 01/05/24 1038  Vitals shown include unfiled device data.  Last Pain:  Vitals:   01/05/24 0729  PainSc: 3       Patients Stated Pain Goal: 1 (01/05/24 0729)  Complications: No notable events documented.

## 2024-01-05 NOTE — Op Note (Signed)
 Operative Report  PREOPERATIVE DIAGNOSIS: 1. Bilateral internal laryngoceles  2. Dysphonia 3. 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 Bilateral internal laryngoceles  2. Dysphonia 3. Left vocal fold lesion/polyp 4. Vocal fold atrophy and glottic insufficiency  PROCEDURE: 1. Direct microlaryngoscopy with excision of the left vocal fold polyp/lesions with vocal fold stripping with CO2 laser 2. Trans-oral excision of the left laryngocele  3. Rigid Bronchoscopy 4. Bilateral injection augmentation with Prolaryn Plus  SURGEON: [name], MD  ASSISTANT: none  ANESTHESIA: General endotracheal with a laser safe tube  COMPLICATIONS: None.  ESTIMATED BLOOD LOSS: < 5 mL  INTRAOPERATIVE FINDINGS: 1. Easy exposure with both female Sataloff laryngoscope and Lindholm laryngoscope  2. Large left sided laryngocele partially blocking the view of the glottis 3. Smaller laryngocele vs saccular cyst on the right side  4. Bilateral vocal fold polypoid changes with a large Reinke's polyp on the left side 5. Vocal fold atrophy 6. No masses or lesions in subglottis or proximal trachea   SPECIMEN: 1. Left vocal fold polyp 2. Left laryngocele    INDICATIONS AND CONSENT: 56 yoF who presented to our office with history of long-standing dysphonia, and evidence of bilateral laryngoceles, a larger one on the left side, and bilateral polypoid lesions with evidence of a left sided polyp and vocal fold atrophy. The procedure, risks, benefits, alternatives, potential complications, possible outcomes as well as the option of no treatment were reviewed with the patient who indicated their understanding and wished to proceed forward with the procedure. Informed consent was obtained.  DESCRIPTION OF PROCEDURE: On 01/05/24 the patient was brought to the operating room by the anesthesia team. The patient was transferred onto the operating  table and placed in supine position. After smooth induction of general endotracheal anesthesia, a surgeon initiated time-out was performed. The bed had been rotated 90 degrees. The patient was then draped in the appropriate fashion. We first began by placement of the wet gauze over the maxillary alveolar ridge for protection during the procedure. The female Sataloff laryngoscope was then advanced into the oral cavity and oropharynx until the larynx was brought into view. The initial exposure was excellent. Once the larynx was adequately visualized the patient was placed into suspension. A 0 degree Hopkins endoscope were then used to visualize the larynx and photo documentation was obtained. Thorough examination including palpation revealed bilateral polypoid changes along the true vocal folds ad a sizable polyp along the left mid to posterior vocal fold, and evidence of a large laryngocele on the left, with a smaller one vs saccular cyst on the right. Bilateral vocal folds were thin. Subglottis and trachea were free of lesions and were patent without scar or signs of inflammation.   Given these findings, we decided to proceed with excision of the left vocal fold polyp first. The lesion was lightly retracted medially with a sweetheart retractor. We then proceeded with excision and vocal fold stripping portion of the procedure using a combination of the CO2 laser and microlaryngeal instruments. The operating microscope was brought into the field. Saline soaked blue towels were wrapped around the face and laryngoscope for protection. All operating personnel were confirmed to be wearing laser safe glasses.  A laser safety time out was then conducted in the usual standard fashion. We first began by utilizing the CO2 laser with the operating microscope. Settings of 3-4 Watts, superpulsed in continuous mode was used to first excise the left vocal fold polyp along  the left true vocal fold. The specimen was sent for  permanent histopathology. We then proceeded with bilateral vocal fold injection augmentation with Prolaryn Plus injecting approximately 0.4 ml of the filler into paraglottic space bilaterally.   We then turned our attention to the left laryngocele. The patient was de-suspended and a Lindholm laryngoscope was used to bring the laryngeal structures into view. Using CO2 laser beam, the left laryngocele was removed. We then defocused the laser beam and used it for hemostasis. Afrin soaked pledgets were used for hemostasis as well. The specimen was sent for permanent histopathology.    At the end of the excision and ablation for hemostasis, we used Afrin soaked pledgets to wipe off char and old blood. Photo documentation was then once again obtained using the endoscope. The patient was then removed from suspension and the laryngoscope was carefully removed without injury to the mucosa. The mouth was once again inspected with no evidence of injury to the teeth.  This completed the procedure. The patient tolerated the procedure well without any immediate post operative complications. All counts were correct x2 at the conclusion of the procedure. The specimens was sent off for permanent pathology.

## 2024-01-05 NOTE — Progress Notes (Signed)
 Per son at bedside, pt fell in the yard a couple weeks ago and has had right hip pain since then. Pt c/o right hip pain with ambulation. Ashok Croon, MD notified.

## 2024-01-05 NOTE — Progress Notes (Signed)
 Pt arrived to room 6N06 via bed after surgery. Received report from Loistine Chance, RN in PACU. See assessment.

## 2024-01-06 ENCOUNTER — Encounter (HOSPITAL_COMMUNITY): Payer: Self-pay | Admitting: Otolaryngology

## 2024-01-06 ENCOUNTER — Telehealth: Payer: Self-pay | Admitting: Otolaryngology

## 2024-01-06 DIAGNOSIS — J383 Other diseases of vocal cords: Secondary | ICD-10-CM | POA: Diagnosis not present

## 2024-01-06 DIAGNOSIS — I10 Essential (primary) hypertension: Secondary | ICD-10-CM | POA: Diagnosis not present

## 2024-01-06 DIAGNOSIS — Z87891 Personal history of nicotine dependence: Secondary | ICD-10-CM | POA: Diagnosis not present

## 2024-01-06 DIAGNOSIS — Q313 Laryngocele: Secondary | ICD-10-CM | POA: Diagnosis not present

## 2024-01-06 DIAGNOSIS — Z9181 History of falling: Secondary | ICD-10-CM | POA: Diagnosis not present

## 2024-01-06 DIAGNOSIS — Z79899 Other long term (current) drug therapy: Secondary | ICD-10-CM | POA: Diagnosis not present

## 2024-01-06 DIAGNOSIS — R490 Dysphonia: Secondary | ICD-10-CM | POA: Diagnosis not present

## 2024-01-06 LAB — SURGICAL PATHOLOGY

## 2024-01-06 MED ORDER — METHYLPREDNISOLONE 4 MG PO TBPK: ORAL_TABLET | ORAL | 1 refills | Status: DC

## 2024-01-06 MED ORDER — AMOXICILLIN-POT CLAVULANATE 500-125 MG PO TABS: 1.0000 | ORAL_TABLET | Freq: Every day | ORAL | 0 refills | Status: AC

## 2024-01-06 MED ORDER — ACETAMINOPHEN-CODEINE 300-30 MG PO TABS: 1.0000 | ORAL_TABLET | Freq: Four times a day (QID) | ORAL | 0 refills | Status: DC | PRN

## 2024-01-06 MED ORDER — FAMOTIDINE 20 MG PO TABS: 20.0000 mg | ORAL_TABLET | Freq: Every day | ORAL | 0 refills | Status: DC

## 2024-01-06 NOTE — Progress Notes (Signed)
 Mobility Specialist Progress Note:   01/06/24 0920  Mobility  Activity Ambulated with assistance in hallway  Level of Assistance Contact guard assist, steadying assist  Assistive Device None  Distance Ambulated (ft) 100 ft  Activity Response Tolerated well  Mobility Referral Yes  Mobility visit 1 Mobility  Mobility Specialist Start Time (ACUTE ONLY) 0920  Mobility Specialist Stop Time (ACUTE ONLY) 0932  Mobility Specialist Time Calculation (min) (ACUTE ONLY) 12 min   Pt agreeable to mobility session. Required only CGA for safety, no overt unsteadiness noted. Pt handed off to PT for PT session. Left with all needs met.   Therisa Rana Mobility Specialist Please contact via SecureChat or  Rehab office at [phone]

## 2024-01-06 NOTE — Discharge Summary (Signed)
 Physician Discharge Summary  Patient ID: Rita Wells MRN: [id number] DOB/AGE: [birth year] 89 y.o.  Admit date: 01/05/2024 Discharge date: 01/06/2024  Admission Diagnoses: laryngocele, dysphonia and vocal fold polyp  Discharge Diagnoses:  Principal Problem:   Acquired laryngocele Active Problems:   Hip pain   Discharged Condition: good  Hospital Course:  65 yoF hx of chronic dysphonia, bilateral laryngoceles, left vocal fold lesion/polyp, who underwent direct microlaryngoscopy and excision using CO2 laser on 01/05/2024.  Due to her age she was admitted for observation overnight.  Patient was having right hip pain and reported a fall prior to admission.  X-ray of the right hip and pelvis were negative for fracture.  She had no trouble breathing, and was able to have adequate p.o. she was seen by physical therapist and was recommended for outpatient PT sessions with assistance required for transfer and transportation and some assistance with walking after the discharge.  Patient was advised to have an appointment with her primary care physician to arrange for outpatient PT sessions.  She was scheduled for postop follow-up with ENT approximately 3 weeks following the discharge.  She was in good condition on the day of discharge.  She was cleared for discharge by hospitalist Dr. Moody who saw the patient on 01/05/2024.  She was discharged home with family.   Consults:  Medicine/Hospitalist  Significant Diagnostic Studies: radiology: X-Ray: R femur and pelvis negative for fracture  Discharge Exam: Blood pressure (!) 149/95, pulse 67, temperature 99.1 F (37.3 C), temperature source Oral, resp. rate 18, height 5' 3 (1.6 m), weight 54.4 kg, SpO2 94%. Exam: General: Well-developed, well-nourished Communication and Voice: slightly raspy Respiratory Respiratory effort: Equal inspiration and expiration without stridor Cardiovascular Peripheral Vascular: Warm extremities with equal  color/perfusion Eyes: No nystagmus with equal extraocular motion bilaterally Neuro CN 2-12 intact Head and Face Inspection: Normocephalic and atraumatic without mass or lesion Facial Strength: Facial motility symmetric and full bilaterally ENT Pinna: External ear intact and fully developed External canal: Canal is patent with intact skin  Lips, Teeth, and gums: Mucosa and teeth intact and viable TMJ: No pain to palpation with full mobility Oral cavity/oropharynx: No erythema or exudate, no lesions present Neck Neck and Trachea: Midline trachea without mass or lesion   Disposition:  See attached discharge instructions Discharge Instructions     Diet - low sodium heart healthy   Complete by: As directed    Increase activity slowly   Complete by: As directed       Allergies as of 01/06/2024       Reactions   Azor [amlodipine-olmesartan]    Unknown Reaction         Medication List     TAKE these medications    acetaminophen -codeine  300-30 MG tablet Commonly known as: TYLENOL  #3 Take 1 tablet by mouth every 6 (six) hours as needed for moderate pain (pain score 4-6) or severe pain (pain score 7-10).   amoxicillin -clavulanate 500-125 MG tablet Commonly known as: AUGMENTIN  Take 1 tablet by mouth daily for 7 days.   cetirizine  10 MG tablet Commonly known as: ZYRTEC  Take 1 tablet (10 mg total) by mouth daily.   famotidine  20 MG tablet Commonly known as: PEPCID  Take 1 tablet (20 mg total) by mouth daily.   feeding supplement Liqd Take 237 mLs by mouth 2 (two) times daily between meals. What changed:  when to take this reasons to take this   fluticasone  50 MCG/ACT nasal spray Commonly known as: FLONASE  Place 2 sprays into  both nostrils 2 (two) times daily.   levothyroxine  88 MCG tablet Commonly known as: SYNTHROID  TAKE ONE TABLET BY MOUTH DAILY   lisinopril  20 MG tablet Commonly known as: ZESTRIL  Take 1 tablet (20 mg total) by mouth daily.    methylPREDNISolone  4 MG Tbpk tablet Commonly known as: MEDROL  DOSEPAK Take with signs of chronic sinusitis and take as directed   metoprolol  succinate 100 MG 24 hr tablet Commonly known as: TOPROL -XL TAKE 1 TABLET DAILY WITH OR IMMEDIATELY AFTER A MEAL   rosuvastatin  20 MG tablet Commonly known as: CRESTOR  TAKE 1 TABLET DAILY         Signed: Emillie Chasen 01/06/2024, 11:09 AM

## 2024-01-06 NOTE — Progress Notes (Signed)
 ENT Progress Note:   HPI: Rita Wells is an 89 y.o. female POD#1 after DML, rigid bronchoscopy and VF lesion removal, excision of left sided laryngocele with CO2 laser, vocal fold injection augmentation. Doing well without pain this am. Voice is a bit stronger today, still raspy. No difficulties breathing. Has some cough, which is expected after the surgery. Able to drink, did not eat anything.  Medicine consulted 2/2 right sided hip pain after a fall at home before surgery and X-ray of right femur and pelvis were negative for fracture. Lives with her grand-daughter. PT recommended    PT note from 01/05/24 Pt in recliner with family present upon arrival and agreeable to PT eval. Prior to admit, pt was independent with no AD. Pt reports one fall ~2 weeks prior after tripping over a garden hose in the yard. In today's session, pt was able to ambulate ~80 ft w/ no AD and CGA for steadying. Family reports PCP recommending using a RW, however, pt declines. Pt is at an increased falls risk with a history of falling and increased 5x STS score. Pt presents to therapy session with decreased LE strength, balance, and mobility. Pt would benefit from acute skilled PT to address functional impairments. Recommending post-acute HHPT to work on overall mobility, balance, and strengthening. Acute PT to follow.     If plan is discharge home, recommend the following: A little help with walking and/or transfers;Help with stairs or ramp for entrance;Assist for transportation    Past Medical History:  Diagnosis Date   Arthritis    Glaucoma    Hyperlipidemia    Hypertension    Toxic goiter     Past Surgical History:  Procedure Laterality Date   CERVICAL FUSION     TUBAL LIGATION      Family History  Problem Relation Age of Onset   Heart disease Mother        MI   Heart attack Mother    CVA Father    Stroke Father    Cancer Sister        OVARIAN   Heart disease Brother        MI   Heart attack  Brother    Cancer Brother    Heart disease Brother        MI   Cancer Sister    Heart disease Sister     Social History:  reports that she quit smoking about 45 years ago. Her smoking use included cigarettes. She has never used smokeless tobacco. She reports that she does not drink alcohol and does not use drugs.  Allergies:  Allergies  Allergen Reactions   Azor [Amlodipine-Olmesartan]     Unknown Reaction     Medications: I have reviewed the patient's current medications.  The PMH, PSH, Medications, Allergies, and SH were reviewed and updated.  ROS: Constitutional: Negative for fever, weight loss and weight gain. Cardiovascular: Negative for chest pain and dyspnea on exertion. Respiratory: Is not experiencing shortness of breath at rest. Gastrointestinal: Negative for nausea and vomiting. Neurological: Negative for headaches. Psychiatric: The patient is not nervous/anxious  Blood pressure (!) 149/95, pulse 67, temperature 99.1 F (37.3 C), temperature source Oral, resp. rate 18, height 5' 3 (1.6 m), weight 54.4 kg, SpO2 94%.  PHYSICAL EXAM:  Exam: General: Well-developed, well-nourished Communication and Voice: slightly raspy Respiratory Respiratory effort: Equal inspiration and expiration without stridor Cardiovascular Peripheral Vascular: Warm extremities with equal color/perfusion Eyes: No nystagmus with equal extraocular motion bilaterally Neuro CN  2-12 intact Head and Face Inspection: Normocephalic and atraumatic without mass or lesion Facial Strength: Facial motility symmetric and full bilaterally ENT Pinna: External ear intact and fully developed External canal: Canal is patent with intact skin  Lips, Teeth, and gums: Mucosa and teeth intact and viable TMJ: No pain to palpation with full mobility Oral cavity/oropharynx: No erythema or exudate, no lesions present Neck Neck and Trachea: Midline trachea without mass or lesion   Procedure:  none   Assessment/Plan: Bilateral laryngocele  Vocal fold polyp/lesion Dysphonia   Doing really well following surgery yesterday no problems with pain or breathing voice is improving but slowly which is expected.  Having cough which is also expected after the surgery.  Vital signs are stable.  Exam is reassuring.  Seen by PT and recommended for assistance with walking and transfers.  X-rays of right hip and pelvis negative for fracture.  Discussed with medicine team Dr. Moody and patient is deemed stable for discharge with outpatient follow-up with PCP and outpatient physical therapy.   - ok to discharge home today with family  -Rx given for Augmentin , Medrol  Dosepak, Pepcid  to help with healing and to prevent infection. -Resume regular diet, soft foods initially advance as tolerated -Outpatient follow-up with me in 3 weeks  [name], MD Otolaryngology [facility] Phone: [phone] Fax: [phone]    01/06/2024, 8:32 AM

## 2024-01-06 NOTE — Plan of Care (Signed)
  Problem: Education: Goal: Knowledge of General Education information will improve Description: Including pain rating scale, medication(s)/side effects and non-pharmacologic comfort measures Outcome: Progressing   Problem: Health Behavior/Discharge Planning: Goal: Ability to manage health-related needs will improve Outcome: Progressing   Problem: Clinical Measurements: Goal: Respiratory complications will improve Outcome: Progressing   Problem: Elimination: Goal: Will not experience complications related to urinary retention Outcome: Progressing   Problem: Pain Management: Goal: General experience of comfort will improve Outcome: Progressing

## 2024-01-06 NOTE — Progress Notes (Signed)
 Pt for discharge to home accomp by family. AVS copy reviewed and given.

## 2024-01-06 NOTE — Discharge Instructions (Signed)
 Discharge Instructions following your surgery   Vocal Cord Surgery This post-operative instruction sheet is designed to help you care for your voice/throat after surgery and help answer any of the common questions you may have. It is not entirely comprehensive, so if you have any questions, do not hesitate to call the office.   What to Expect: - It is common to have a sore throat for several days after surgery. You may have some tongue numbness/pain or taste changes as well - these are temporary but can take several weeks to resolve. - You can eat and drink anything you normally do - there are no restrictions due to surgery alone.  If you have been recommended any other type of diet, such as an acid reflux diet, you should continue that.  You may want to eat light meals the day of anesthesia to make sure you don't get nauseated. You may have some pain after surgery, even with pain medications. To make this pain more tolerable, you should use Tylenol  and/or ibuprofen. You can take 500mg -600mg  of tylenol  every 6 hours, alternating with to 400-600mg  of ibuprofen every 6 hours, so long as your regular doctor hasn't told you to avoid either of these medications. If you don't take acid reflux medication already, you should add a dose of famotidine  20mg  with any ibuprofen dose over 400mg . If you're not sure whether these medications are safe for you, you should ask Dr Jarrah Babich or your regular doctor first (particularly if you have a heart condition or history of stomach ulcers or are on blood thinners). If it is more severe pain that doesn't respond to the medications above, you should request or use the prescription narcotic pain medicine you were given. (You can call the office to request a script if you need one.)  If you have to use narcotics, plan to use a stool softener as well.  - It is very important that you stay well-hydrated and drink plenty of fluids during the recovery period.  Getting dehydrated  tends to worsen post-operative pain. - Avoid tobacco products, spicy foods, excessive alcohol, or eating late at night as these may cause heartburn or reflux of stomach acid into the throat and may delay the healing process.  If you are on reflux medication already (such as Prilosec (omeprazole) or Nexium (esomeprazole)), continue it after surgery.  If you are not on anything regularly and have reflux symptoms in the post-operative period (heartburn, throat burning, excessive throat mucous or throat-clearing, sensation of a lump in your throat) then you can treat these symptoms with over-the-counter Pepcid  20-40mg . - Limit coughing or throat clearing. If you have the urge to clear your throat, take a sip of water and a hard swallow instead. Drinking plenty of water can help alleviate the urge to clear the throat.  If you cannot control your cough, you may take the narcotic pain medicine as a cough suppressant - it works similar to codeine  cough syrup to help decrease cough.  If this doesn't help or the cough is excessive, please call the office. - You can return to normal activity 24-48 hours after surgery; avoid intense exercise for the first week. - Call the office if you experience any of the following: Fever higher than 101 F Complete loss of voice Difficulty breathing or swallowing Bleeding from the mouth  Voice Use After Surgery: - Follow the voice rest instructions. Rest your voice for 48 hrs and this means that you need to use dry erase board  or texting to communicate. After 48 hrs, start to slowly use your voice and do some glides with your voice, gentle singing to warm up.   Nutrition: - Some patients have less of an appetite after surgery, and it is ok to decrease food intake. However, it is not ok to decrease fluid intake. It is very important to continue to drink plenty of fluids. - Drinking fluids will help lessen throat discomfort. -You can eat and drink as you normally do, but limit  any foods that might cause acid reflux (fried foods, meat, caffeine or alcohol, acidic sauces or fruits).  You may want to eat light meals the day of anesthesia to make sure you don't get nauseated.  Safety Information for Giving and Taking Medications: - Each time you give a medication, read the label. - If your medication is in liquid form, do not measure liquid with a kitchen spoon. There are pediatric measuring devices available at the pharmacy. Ask for one when you get your prescription filled. - If you have questions, ask the pharmacist. - Do not take Tylenol  for pain if you have a history of liver problems. Check with your primary care doctor if you are uncertain. - Do not take ibuprofen for pain if you have a history of stomach bleeding or ulcers and have been told to avoid Non-Steroidal Anti-Inflammatory Drugs (NSAIDs).  Certain blood thinners can also interact with ibuprofen.  Check with your primary care doctor if you are uncertain.  Follow-up Appointment: You should see Dr Giovan Pinsky approximately 2-3 weeks after your surgery. If you don't have an appointment, please call her office to schedule it.   You were seen by a Medical Doctor and physical therapist for your hip pain. You will benefit from outpatient physical therapy and your primary care doctor can help you arrange that. Please make an appointment with your primary care doctor to be seen for your hip pain and to arrange outpatient physical therapy.   You were given the following scripts:  Augmentin  - antibiotic to prevent infection Medrol  dose pack - steroid to help with swelling Pepcid  - anti-reflux medication to help with healing  Tylenol  with Codeine  - if you have significant discomfort after surgery - take this medication as needed. Please take extra precautions to not exceed maximum daily dose of Tylenol  to avoid liver damage

## 2024-01-06 NOTE — Progress Notes (Signed)
 Patient d/c'd prior to my being able to see.  Will order home health.  Would recommend PCP refer to orthopedics and consider CT scan of hip if pain continues.  Marlin Canary DO

## 2024-01-06 NOTE — TOC Initial Note (Signed)
 Transition of Care (TOC) - Initial/Assessment Note   Spoke to patient and daughter [name] at bedside. Confirmed face sheet information. Patient lives with grand daughter.   Discussed home health PT. Both agreeable. No preference . Darleene with [facility] accepted referral. Information on AVS  Patient Details  Name: Rita Wells MRN: [id number] Date of Birth: 03-Nov-1933  Transition of Care [facility]) CM/SW Contact:    Stephane Powell Jansky, RN Phone Number: 01/06/2024, 12:20 PM  Clinical Narrative:                   Expected Discharge Plan: Home w Home Health Services Barriers to Discharge: No Barriers Identified   Patient Goals and CMS Choice Patient states their goals for this hospitalization and ongoing recovery are:: to return to home CMS Medicare.gov Compare Post Acute Care list provided to:: Patient Choice offered to / list presented to : Patient      Expected Discharge Plan and Services   Discharge Planning Services: CM Consult Post Acute Care Choice: Home Health Living arrangements for the past 2 months: Single Family Home Expected Discharge Date: 01/06/24               DME Arranged: N/A         HH Arranged: PT HH Agency: [facility] Date [facility] Agency Contacted: 01/06/24 Time HH Agency Contacted: [id number] Representative spoke with at [facility] Agency: [facility]  Prior Living Arrangements/Services Living arrangements for the past 2 months: Single Family Home Lives with:: Other (Comment) (grand daughter) Patient language and need for interpreter reviewed:: Yes Do you feel safe going back to the place where you live?: Yes      Need for Family Participation in Patient Care: Yes (Comment) Care giver support system in place?: Yes (comment)   Criminal Activity/Legal Involvement Pertinent to Current Situation/Hospitalization: No - Comment as needed  Activities of Daily Living   ADL Screening (condition at time of admission) Independently performs ADLs?: Yes (appropriate for  developmental age) Is the patient deaf or have difficulty hearing?: No Does the patient have difficulty seeing, even when wearing glasses/contacts?: No Does the patient have difficulty concentrating, remembering, or making decisions?: No  Permission Sought/Granted   Permission granted to share information with : Yes, Verbal Permission Granted  Share Information with NAME: daughter [name] Gin [phone]  Permission granted to share info w AGENCY: [facility]        Emotional Assessment Appearance:: Appears stated age Attitude/Demeanor/Rapport: Engaged Affect (typically observed): Accepting Orientation: : Oriented to Self, Oriented to Place, Oriented to  Time, Oriented to Situation Alcohol / Substance Use: Not Applicable Psych Involvement: No (comment)  Admission diagnosis:  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] Patient Active Problem List   Diagnosis Date Noted   Acquired laryngocele 01/05/2024   Hip pain 01/05/2024   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   Chronic kidney disease 11/05/2023   Hoarseness of voice 11/05/2023   Osteopenia 10/03/2020   Chronic obstructive pulmonary disease (HCC) 07/02/2020   DDD (degenerative disc disease), thoracic 10/27/2017   DDD (degenerative disc disease), cervical 10/27/2017   Sciatica of left side 03/24/2017   Allergic rhinitis 03/24/2017   Hypothyroidism 07/03/2015   Toxic goiter    Hypertension 04/19/2014   Hyperlipidemia 04/19/2014   Generalized anxiety disorder 04/19/2014   Insomnia 04/19/2014   PCP:  Lavell Bari LABOR, FNP Pharmacy:   [facility] [place], [place] - [address] [place]  [address] Phone: [phone] Fax: [phone]     Social Drivers of Health (SDOH) Social History: SDOH Screenings   Food Insecurity: No Food Insecurity (01/05/2024)  Housing: Low Risk  (01/05/2024)  Transportation Needs: No Transportation Needs (01/05/2024)  Utilities: Not At Risk  (01/05/2024)  Alcohol Screen: Low Risk  (04/12/2023)  Depression (PHQ2-9): Low Risk  (12/06/2023)  Financial Resource Strain: Low Risk  (04/12/2023)  Physical Activity: Insufficiently Active (04/12/2023)  Social Connections: Socially Isolated (01/05/2024)  Stress: No Stress Concern Present (04/12/2023)  Tobacco Use: Medium Risk (01/05/2024)   SDOH Interventions:     Readmission Risk Interventions     No data to display

## 2024-01-06 NOTE — Telephone Encounter (Signed)
 Called and lvmail to see if the 3week from sx date works for the pt

## 2024-01-06 NOTE — Progress Notes (Signed)
 Physical Therapy Treatment Patient Details Name: NONI STONESIFER MRN: [id number] DOB: 07-10-1933 Today's Date: 01/06/2024   History of Present Illness 89 y.o. 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 Pain in R hip with negative x-rays. PMHx: dysphonia, HTN, hyperlipidemia, arthritis, cervical fusion    PT Comments  Pt ambulating with mobility specialist in hall upon arrival and agreeable to PT session. Worked on gait training and LE strength in today's session. Pt was supervision with no AD while ambulating in the hall. Pt reports pain in R sacrum w/ WB, however, seated exercises did not increase pain. Limited session as pt declined further PT after transferring to recliner. Discussed using level entrance to enter/exit home upon d/c since pt did not want to practice stair negotiation. Pt is progressing towards goals. Acute PT to follow.      If plan is discharge home, recommend the following: A little help with walking and/or transfers;Help with stairs or ramp for entrance;Assist for transportation   Can travel by private vehicle      Yes  Equipment Recommendations  Other (comment) (will continue to assess)       Precautions / Restrictions Precautions Precautions: Fall Restrictions Weight Bearing Restrictions Per Provider Order: No     Mobility  Bed Mobility  General bed mobility comments: in hallway upon arrival w/ mobility, returned to recliner    Transfers Overall transfer level: Needs assistance Equipment used: None Transfers: Sit to/from Stand Sit to Stand: Contact guard assist  General transfer comment: CGA for safety    Ambulation/Gait Ambulation/Gait assistance: Supervision Gait Distance (Feet): 80 Feet Assistive device: None Gait Pattern/deviations: Step-through pattern, Shuffle, Narrow base of support Gait velocity: decr    General Gait Details: slow and steady  gait       Balance Overall balance assessment: Needs assistance, History of Falls Sitting-balance support: Bilateral upper extremity supported, Feet supported Sitting balance-Leahy Scale: Good     Standing balance support: No upper extremity supported Standing balance-Leahy Scale: Fair Standing balance comment: able to stand and ambulate w/ no AD       Cognition Arousal: Alert Behavior During Therapy: WFL for tasks assessed/performed Overall Cognitive Status: Within Functional Limits for tasks assessed     Exercises General Exercises - Lower Extremity Long Arc Quad: AROM, Both, 10 reps, Seated Hip Flexion/Marching: AROM, Both, 10 reps, Seated    General Comments General comments (skin integrity, edema, etc.): VSS on RA, family present and supportive throughout session      Pertinent Vitals/Pain Pain Assessment Pain Assessment: Faces Faces Pain Scale: Hurts little more Pain Location: R sacrum Pain Descriptors / Indicators: Aching, Discomfort Pain Intervention(s): Limited activity within patient's tolerance, Monitored during session, Repositioned     PT Goals (current goals can now be found in the care plan section) Acute Rehab PT Goals Patient Stated Goal: to go home PT Goal Formulation: With patient/family Time For Goal Achievement: 01/19/24 Potential to Achieve Goals: Good Progress towards PT goals: Progressing toward goals    Frequency    Min 1X/week       AM-PAC PT 6 Clicks Mobility   Outcome Measure  Help needed turning from your back to your side while in a flat bed without using bedrails?: None Help needed moving from lying on your back to sitting on the side of a flat bed without using bedrails?: A Little Help needed moving to and from a bed to a  chair (including a wheelchair)?: A Little Help needed standing up from a chair using your arms (e.g., wheelchair or bedside chair)?: A Little Help needed to walk in hospital room?: A Little Help needed  climbing 3-5 steps with a railing? : A Little 6 Click Score: 19    End of Session Equipment Utilized During Treatment: Gait belt Activity Tolerance: Patient tolerated treatment well Patient left: in chair;with call bell/phone within reach;with chair alarm set;with family/visitor present Nurse Communication: Mobility status PT Visit Diagnosis: Unsteadiness on feet (R26.81);History of falling (Z91.81);Muscle weakness (generalized) (M62.81)     Time: 9069-9058 PT Time Calculation (min) (ACUTE ONLY): 11 min  Charges:    $Gait Training: 8-22 mins PT General Charges $$ ACUTE PT VISIT: 1 Visit                     Kate ORN, PT, DPT Secure Chat Preferred  Rehab Office [phone]   Kate BRAVO Wendolyn 01/06/2024, 9:46 AM

## 2024-01-19 ENCOUNTER — Ambulatory Visit (INDEPENDENT_AMBULATORY_CARE_PROVIDER_SITE_OTHER): Payer: Medicare HMO | Admitting: Otolaryngology

## 2024-01-26 ENCOUNTER — Ambulatory Visit (INDEPENDENT_AMBULATORY_CARE_PROVIDER_SITE_OTHER): Payer: Medicare HMO | Admitting: Otolaryngology

## 2024-01-31 ENCOUNTER — Inpatient Hospital Stay: Payer: Medicare HMO | Admitting: Family

## 2024-02-03 ENCOUNTER — Ambulatory Visit (INDEPENDENT_AMBULATORY_CARE_PROVIDER_SITE_OTHER): Payer: Medicare HMO | Admitting: Otolaryngology

## 2024-02-15 ENCOUNTER — Other Ambulatory Visit: Payer: Self-pay | Admitting: Family

## 2024-02-15 DIAGNOSIS — E7849 Other hyperlipidemia: Secondary | ICD-10-CM

## 2024-03-06 ENCOUNTER — Ambulatory Visit (INDEPENDENT_AMBULATORY_CARE_PROVIDER_SITE_OTHER): Payer: Medicare HMO | Admitting: Family

## 2024-03-06 ENCOUNTER — Encounter: Payer: Self-pay | Admitting: Family

## 2024-03-06 VITALS — BP 97/67 | Temp 97.1°F | Wt 112.0 lb

## 2024-03-06 DIAGNOSIS — E039 Hypothyroidism, unspecified: Secondary | ICD-10-CM | POA: Diagnosis not present

## 2024-03-06 DIAGNOSIS — N184 Chronic kidney disease, stage 4 (severe): Secondary | ICD-10-CM | POA: Diagnosis not present

## 2024-03-06 DIAGNOSIS — I129 Hypertensive chronic kidney disease with stage 1 through stage 4 chronic kidney disease, or unspecified chronic kidney disease: Secondary | ICD-10-CM

## 2024-03-06 DIAGNOSIS — E7849 Other hyperlipidemia: Secondary | ICD-10-CM | POA: Diagnosis not present

## 2024-03-06 DIAGNOSIS — F411 Generalized anxiety disorder: Secondary | ICD-10-CM | POA: Diagnosis not present

## 2024-03-06 DIAGNOSIS — I10 Essential (primary) hypertension: Secondary | ICD-10-CM | POA: Diagnosis not present

## 2024-03-06 DIAGNOSIS — G4700 Insomnia, unspecified: Secondary | ICD-10-CM

## 2024-03-06 DIAGNOSIS — E441 Mild protein-calorie malnutrition: Secondary | ICD-10-CM | POA: Insufficient documentation

## 2024-03-06 DIAGNOSIS — J449 Chronic obstructive pulmonary disease, unspecified: Secondary | ICD-10-CM | POA: Diagnosis not present

## 2024-03-06 NOTE — Progress Notes (Signed)
 Subjective:    Patient ID: Rita Wells, female    DOB: [birth year], 89 y.o.   MRN: [id number]  Chief Complaint  Patient presents with   Medical Management of Chronic Issues    Has some fall and throat surgery    Pt presents to the office today for chronic follow up. She has COPD, but states this is stable. Quit smoking approx 2012.   Has CKD and has follow up with nephrologists.   She has hoarse voice. She had laryngocele, dysphonia and vocal fold polyp. She had a microlaryngoscopy and excision using CO2 laser. Wanted to go back to remove it more, however, patient states she was done with it.   She has fallen twice in the last 3 months. She had a negative x-ray of her hip for fracture.    She has lost 16 lbs since 04/12/23. Reports she has not been trying. She is drinking ensures.      03/06/2024    2:15 PM 01/05/2024    6:48 AM 12/06/2023    2:14 PM  Last 3 Weights  Weight (lbs) 112 lb 120 lb 124 lb  Weight (kg) 50.803 kg 54.432 kg 56.246 kg     Hypertension This is a chronic problem. The current episode started more than 1 year ago. The problem has been resolved since onset. The problem is controlled. Associated symptoms include anxiety and malaise/fatigue. Pertinent negatives include no peripheral edema or shortness of breath. Risk factors for coronary artery disease include dyslipidemia and sedentary lifestyle. The current treatment provides moderate improvement. Identifiable causes of hypertension include a thyroid problem.  Thyroid Problem Presents for follow-up visit. Symptoms include anxiety, fatigue and hoarse voice. Patient reports no dry skin. The symptoms have been stable.  Insomnia Primary symptoms: sleep disturbance, difficulty falling asleep, malaise/fatigue.   The current episode started more than one year. The onset quality is gradual. The problem occurs intermittently. Past treatments include meditation. The treatment provided moderate relief.   Hyperlipidemia This is a chronic problem. The current episode started more than 1 year ago. The problem is controlled. Recent lipid tests were reviewed and are normal. Pertinent negatives include no shortness of breath. Current antihyperlipidemic treatment includes statins. The current treatment provides moderate improvement of lipids. Risk factors for coronary artery disease include dyslipidemia, hypertension, a sedentary lifestyle and post-menopausal.  Anxiety Presents for follow-up visit. Symptoms include excessive worry, insomnia, nervous/anxious behavior and restlessness. Patient reports no shortness of breath. Symptoms occur rarely. The severity of symptoms is mild.        Review of Systems  Constitutional:  Positive for fatigue and malaise/fatigue.  HENT:  Positive for hoarse voice.   Respiratory:  Negative for shortness of breath.   Psychiatric/Behavioral:  Positive for sleep disturbance. The patient is nervous/anxious and has insomnia.   All other systems reviewed and are negative.      Objective:   Physical Exam Vitals reviewed.  Constitutional:      General: She is not in acute distress.    Appearance: She is well-developed and underweight.     Comments: underweight  HENT:     Head: Normocephalic and atraumatic.     Comments: Hoarse voice    Right Ear: Tympanic membrane normal.     Left Ear: Tympanic membrane normal.  Eyes:     Pupils: Pupils are equal, round, and reactive to light.  Neck:     Thyroid: No thyromegaly.  Cardiovascular:     Rate and Rhythm: Normal rate and  regular rhythm.     Heart sounds: Normal heart sounds. No murmur heard. Pulmonary:     Effort: Pulmonary effort is normal. No respiratory distress.     Breath sounds: Normal breath sounds. No wheezing.  Abdominal:     General: Bowel sounds are normal. There is no distension.     Palpations: Abdomen is soft.     Tenderness: There is no abdominal tenderness.  Musculoskeletal:        General:  No tenderness. Normal range of motion.     Cervical back: Normal range of motion and neck supple.  Skin:    General: Skin is warm and dry.  Neurological:     Mental Status: She is alert and oriented to person, place, and time.     Cranial Nerves: No cranial nerve deficit.     Deep Tendon Reflexes: Reflexes are normal and symmetric.  Psychiatric:        Behavior: Behavior normal.        Thought Content: Thought content normal.        Judgment: Judgment normal.       BP 100/76   Temp (!) 97.1 F (36.2 C) (Temporal)   Wt 112 lb (50.8 kg)   BMI 19.84 kg/m      Assessment & Plan:   Alyssamae Klinck Nooney comes in today with chief complaint of Medical Management of Chronic Issues (Has some fall and throat surgery )   Diagnosis and orders addressed:  1. Primary hypertension (Primary) Hypotensive today, will stop lisinopril 20 mg  Force fluids - CMP14+EGFR - CBC with Differential/Platelet  2. Other hyperlipidemia - CMP14+EGFR - CBC with Differential/Platelet  3. Generalized anxiety disorder - CMP14+EGFR - CBC with Differential/Platelet  4. Insomnia, unspecified type - CMP14+EGFR - CBC with Differential/Platelet  5. Hypothyroidism, unspecified type - CMP14+EGFR - CBC with Differential/Platelet  6. Stage 4 chronic kidney disease (HCC) - CMP14+EGFR - CBC with Differential/Platelet  7. Mild protein-calorie malnutrition (HCC) Start drinking ensure BID with meals, can add ice cream     Labs pending Continue current medications  Keep nephrologists appointment  High protein diet, encourage ensure milkshakes.   Health Maintenance reviewed Diet and exercise encouraged  Follow up plan: 3 months    Jannifer Rodney, FNP

## 2024-03-07 ENCOUNTER — Other Ambulatory Visit: Payer: Self-pay | Admitting: Family

## 2024-03-07 LAB — CMP14+EGFR
ALT: 13 IU/L (ref 0–32)
AST: 21 IU/L (ref 0–40)
Albumin: 4.2 g/dL (ref 3.7–4.7)
Alkaline Phosphatase: 106 IU/L (ref 44–121)
BUN/Creatinine Ratio: 14 (ref 12–28)
BUN: 34 mg/dL — ABNORMAL HIGH (ref 8–27)
Bilirubin Total: 0.7 mg/dL (ref 0.0–1.2)
CO2: 19 mmol/L — ABNORMAL LOW (ref 20–29)
Calcium: 10 mg/dL (ref 8.7–10.3)
Chloride: 95 mmol/L — ABNORMAL LOW (ref 96–106)
Creatinine, Ser: 2.46 mg/dL — ABNORMAL HIGH (ref 0.57–1.00)
Globulin, Total: 2.3 g/dL (ref 1.5–4.5)
Glucose: 110 mg/dL — ABNORMAL HIGH (ref 70–99)
Potassium: 4.3 mmol/L (ref 3.5–5.2)
Sodium: 132 mmol/L — ABNORMAL LOW (ref 134–144)
Total Protein: 6.5 g/dL (ref 6.0–8.5)
eGFR: 18 mL/min/{1.73_m2} — ABNORMAL LOW (ref >59)

## 2024-03-07 LAB — CBC WITH DIFFERENTIAL/PLATELET
Basophils Absolute: 0 10*3/uL (ref 0.0–0.2)
Basos: 0 %
EOS (ABSOLUTE): 0.1 10*3/uL (ref 0.0–0.4)
Eos: 1 %
Hematocrit: 43.6 % (ref 34.0–46.6)
Hemoglobin: 14.6 g/dL (ref 11.1–15.9)
Immature Grans (Abs): 0.1 10*3/uL (ref 0.0–0.1)
Immature Granulocytes: 1 %
Lymphocytes Absolute: 0.9 10*3/uL (ref 0.7–3.1)
Lymphs: 7 %
MCH: 29 pg (ref 26.6–33.0)
MCHC: 33.5 g/dL (ref 31.5–35.7)
MCV: 87 fL (ref 79–97)
Monocytes Absolute: 0.9 10*3/uL (ref 0.1–0.9)
Monocytes: 8 %
Neutrophils Absolute: 10.3 10*3/uL — ABNORMAL HIGH (ref 1.4–7.0)
Neutrophils: 83 %
Platelets: 282 10*3/uL (ref 150–450)
RBC: 5.04 x10E6/uL (ref 3.77–5.28)
RDW: 14.9 % (ref 11.7–15.4)
WBC: 12.3 10*3/uL — ABNORMAL HIGH (ref 3.4–10.8)

## 2024-03-07 MED ORDER — DOXYCYCLINE HYCLATE 100 MG PO TABS: 100.0000 mg | ORAL_TABLET | Freq: Two times a day (BID) | ORAL | 0 refills | Status: DC

## 2024-03-20 ENCOUNTER — Encounter: Payer: Self-pay | Admitting: Family

## 2024-03-20 ENCOUNTER — Telehealth (INDEPENDENT_AMBULATORY_CARE_PROVIDER_SITE_OTHER): Admitting: Family

## 2024-03-20 ENCOUNTER — Telehealth: Payer: Self-pay | Admitting: Family

## 2024-03-20 VITALS — BP 133/85

## 2024-03-20 DIAGNOSIS — I10 Essential (primary) hypertension: Secondary | ICD-10-CM | POA: Diagnosis not present

## 2024-03-20 NOTE — Progress Notes (Signed)
 Virtual Visit Consent   Rita Wells, you are scheduled for a virtual visit with a [facility] provider today. Just as with appointments in the office, your consent must be obtained to participate. Your consent will be active for this visit and any virtual visit you may have with one of our providers in the next 365 days. If you have a MyChart account, a copy of this consent can be sent to you electronically.  As this is a virtual visit, video technology does not allow for your provider to perform a traditional examination. This may limit your provider's ability to fully assess your condition. If your provider identifies any concerns that need to be evaluated in person or the need to arrange testing (such as labs, EKG, etc.), we will make arrangements to do so. Although advances in technology are sophisticated, we cannot ensure that it will always work on either your end or our end. If the connection with a video visit is poor, the visit may have to be switched to a telephone visit. With either a video or telephone visit, we are not always able to ensure that we have a secure connection.  By engaging in this virtual visit, you consent to the provision of healthcare and authorize for your insurance to be billed (if applicable) for the services provided during this visit. Depending on your insurance coverage, you may receive a charge related to this service.  I need to obtain your verbal consent now. Are you willing to proceed with your visit today? Rita Wells has provided verbal consent on 03/20/2024 for a virtual visit (video or telephone). Rita Rodney, FNP  Date: 03/20/2024 4:05 PM   Virtual Visit via Video Note   I, Rita Wells, connected with  Rita Wells  ([id number], May 07, 1933) on 03/20/24 at  3:40 PM EDT by a video-enabled telemedicine application and verified that I am speaking with the correct person using two identifiers.  Location: Patient: Virtual Visit Location  Patient: Home Provider: Virtual Visit Location Provider: Home Office   I discussed the limitations of evaluation and management by telemedicine and the availability of in person appointments. The patient expressed understanding and agreed to proceed.    History of Present Illness: Rita Wells is a 89 y.o. who identifies as a female who was assigned female at birth, and is being seen today to recheck hypotension. She was seen on 03/06/24 and found to have a BP of  100/76 and 97/67. We stopped the Lisinopril 20 mg.   Her BP is 133/85 at home today.   She continues have mild weakness.    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   DDD (degenerative disc disease), cervical 10/27/2017   Sciatica of left side 03/24/2017   Allergic rhinitis 03/24/2017   Hypothyroidism 07/03/2015   Toxic goiter    Hypertension 04/19/2014   Hyperlipidemia 04/19/2014   Generalized anxiety disorder 04/19/2014   Insomnia 04/19/2014    Allergies:  Allergies  Allergen Reactions   Azor [Amlodipine-Olmesartan]     Unknown Reaction    Medications:  Current Outpatient Medications:    cetirizine (  ZYRTEC) 10 MG tablet, Take 1 tablet (10 mg total) by mouth daily., Disp: 90 tablet, Rfl: 11   feeding supplement (ENSURE ENLIVE / ENSURE PLUS) LIQD, Take 237 mLs by mouth 2 (two) times daily between meals. (Patient taking differently: Take 237 mLs by mouth 2 (two) times daily as needed (supplement).), Disp: 23700 mL, Rfl: 1   levothyroxine (SYNTHROID) 88 MCG tablet, TAKE ONE TABLET BY MOUTH DAILY, Disp: 90  tablet, Rfl: 2   metoprolol succinate (TOPROL-XL) 100 MG 24 hr tablet, TAKE 1 TABLET DAILY WITH OR IMMEDIATELY AFTER A MEAL, Disp: 90 tablet, Rfl: 0   rosuvastatin (CRESTOR) 20 MG tablet, TAKE ONE TABLET DAILY (Patient not taking: Reported on 03/06/2024), Disp: 90 tablet, Rfl: 0  Observations/Objective: Patient is well-developed, well-nourished in no acute distress.  Resting comfortably  at home.  Head is normocephalic, atraumatic.  No labored breathing.  Speech is clear and coherent with logical content.  Patient is alert and oriented at baseline.  Hoarse voice   Assessment and Plan: 1. Essential hypertension (Primary)  At goal Will continue to monitor  If >140/90 may need to restart lisinopril but at lower dose (5 or 10 mg) Encouraged high protein/calorie diet Monitor weight at home Follow up 3 months   Follow Up Instructions: I discussed the assessment and treatment plan with the patient. The patient was provided an opportunity to ask questions and all were answered. The patient agreed with the plan and demonstrated an understanding of the instructions.  A copy of instructions were sent to the patient via MyChart unless otherwise noted below.     The patient was advised to call back or seek an in-person evaluation if the symptoms worsen or if the condition fails to improve as anticipated.    Rita Rodney, FNP

## 2024-03-20 NOTE — Telephone Encounter (Signed)
 Copied from CRM [id number]. Topic: Clinical - Medical Advice >> Mar 20, 2024  8:29 AM Franchot Heidelberg wrote: Reason for CRM: Pt's daughter Gregary Cromer called to report that the patient does not want to come to her appt today. Pt's daughter is requesting to speak to a nurse for advice on how to help patient get the care that she needs  Best contact: [phone]

## 2024-03-20 NOTE — Telephone Encounter (Signed)
 Appt changed to video visit.

## 2024-04-17 ENCOUNTER — Other Ambulatory Visit: Payer: Self-pay | Admitting: Family

## 2024-04-17 DIAGNOSIS — I10 Essential (primary) hypertension: Secondary | ICD-10-CM

## 2024-05-04 ENCOUNTER — Inpatient Hospital Stay: Payer: Medicare HMO

## 2024-05-11 ENCOUNTER — Inpatient Hospital Stay: Payer: Medicare HMO | Admitting: Oncology

## 2024-05-30 ENCOUNTER — Other Ambulatory Visit: Payer: Self-pay | Admitting: Family

## 2024-05-30 DIAGNOSIS — I10 Essential (primary) hypertension: Secondary | ICD-10-CM

## 2024-05-30 DIAGNOSIS — E7849 Other hyperlipidemia: Secondary | ICD-10-CM

## 2024-06-26 ENCOUNTER — Encounter: Payer: Self-pay | Admitting: Family

## 2024-06-26 ENCOUNTER — Ambulatory Visit: Admitting: Family

## 2024-06-26 VITALS — BP 193/106 | HR 67 | Temp 98.2°F | Ht 63.0 in | Wt 114.8 lb

## 2024-06-26 DIAGNOSIS — G4700 Insomnia, unspecified: Secondary | ICD-10-CM | POA: Diagnosis not present

## 2024-06-26 DIAGNOSIS — F411 Generalized anxiety disorder: Secondary | ICD-10-CM | POA: Diagnosis not present

## 2024-06-26 DIAGNOSIS — E039 Hypothyroidism, unspecified: Secondary | ICD-10-CM

## 2024-06-26 DIAGNOSIS — N184 Chronic kidney disease, stage 4 (severe): Secondary | ICD-10-CM | POA: Diagnosis not present

## 2024-06-26 DIAGNOSIS — R490 Dysphonia: Secondary | ICD-10-CM

## 2024-06-26 DIAGNOSIS — E7849 Other hyperlipidemia: Secondary | ICD-10-CM

## 2024-06-26 DIAGNOSIS — E441 Mild protein-calorie malnutrition: Secondary | ICD-10-CM

## 2024-06-26 DIAGNOSIS — I10 Essential (primary) hypertension: Secondary | ICD-10-CM

## 2024-06-26 MED ORDER — LISINOPRIL 5 MG PO TABS: 5.0000 mg | ORAL_TABLET | Freq: Every day | ORAL | 3 refills | Status: AC

## 2024-06-26 NOTE — Patient Instructions (Signed)
 Hypertension, Adult High blood pressure (hypertension) is when the force of blood pumping through the arteries is too strong. The arteries are the blood vessels that carry blood from the heart throughout the body. Hypertension forces the heart to work harder to pump blood and may cause arteries to become narrow or stiff. Untreated or uncontrolled hypertension can lead to a heart attack, heart failure, a stroke, kidney disease, and other problems. A blood pressure reading consists of a higher number over a lower number. Ideally, your blood pressure should be below 120/80. The first ("top") number is called the systolic pressure. It is a measure of the pressure in your arteries as your heart beats. The second ("bottom") number is called the diastolic pressure. It is a measure of the pressure in your arteries as the heart relaxes. What are the causes? The exact cause of this condition is not known. There are some conditions that result in high blood pressure. What increases the risk? Certain factors may make you more likely to develop high blood pressure. Some of these risk factors are under your control, including: Smoking. Not getting enough exercise or physical activity. Being overweight. Having too much fat, sugar, calories, or salt (sodium) in your diet. Drinking too much alcohol. Other risk factors include: Having a personal history of heart disease, diabetes, high cholesterol, or kidney disease. Stress. Having a family history of high blood pressure and high cholesterol. Having obstructive sleep apnea. Age. The risk increases with age. What are the signs or symptoms? High blood pressure may not cause symptoms. Very high blood pressure (hypertensive crisis) may cause: Headache. Fast or irregular heartbeats (palpitations). Shortness of breath. Nosebleed. Nausea and vomiting. Vision changes. Severe chest pain, dizziness, and seizures. How is this diagnosed? This condition is diagnosed by  measuring your blood pressure while you are seated, with your arm resting on a flat surface, your legs uncrossed, and your feet flat on the floor. The cuff of the blood pressure monitor will be placed directly against the skin of your upper arm at the level of your heart. Blood pressure should be measured at least twice using the same arm. Certain conditions can cause a difference in blood pressure between your right and left arms. If you have a high blood pressure reading during one visit or you have normal blood pressure with other risk factors, you may be asked to: Return on a different day to have your blood pressure checked again. Monitor your blood pressure at home for 1 week or longer. If you are diagnosed with hypertension, you may have other blood or imaging tests to help your health care provider understand your overall risk for other conditions. How is this treated? This condition is treated by making healthy lifestyle changes, such as eating healthy foods, exercising more, and reducing your alcohol intake. You may be referred for counseling on a healthy diet and physical activity. Your health care provider may prescribe medicine if lifestyle changes are not enough to get your blood pressure under control and if: Your systolic blood pressure is above 130. Your diastolic blood pressure is above 80. Your personal target blood pressure may vary depending on your medical conditions, your age, and other factors. Follow these instructions at home: Eating and drinking  Eat a diet that is high in fiber and potassium, and low in sodium, added sugar, and fat. An example of this eating plan is called the DASH diet. DASH stands for Dietary Approaches to Stop Hypertension. To eat this way: Eat  plenty of fresh fruits and vegetables. Try to fill one half of your plate at each meal with fruits and vegetables. Eat whole grains, such as whole-wheat pasta, brown rice, or whole-grain bread. Fill about one  fourth of your plate with whole grains. Eat or drink low-fat dairy products, such as skim milk or low-fat yogurt. Avoid fatty cuts of meat, processed or cured meats, and poultry with skin. Fill about one fourth of your plate with lean proteins, such as fish, chicken without skin, beans, eggs, or tofu. Avoid pre-made and processed foods. These tend to be higher in sodium, added sugar, and fat. Reduce your daily sodium intake. Many people with hypertension should eat less than 1,500 mg of sodium a day. Do not drink alcohol if: Your health care provider tells you not to drink. You are pregnant, may be pregnant, or are planning to become pregnant. If you drink alcohol: Limit how much you have to: 0-1 drink a day for women. 0-2 drinks a day for men. Know how much alcohol is in your drink. In the [place] one drink equals one 12 oz bottle of beer (355 mL), one 5 oz glass of wine (148 mL), or one 1 oz glass of hard liquor (44 mL). Lifestyle  Work with your health care provider to maintain a healthy body weight or to lose weight. Ask what an ideal weight is for you. Get at least 30 minutes of exercise that causes your heart to beat faster (aerobic exercise) most days of the week. Activities may include walking, swimming, or biking. Include exercise to strengthen your muscles (resistance exercise), such as Pilates or lifting weights, as part of your weekly exercise routine. Try to do these types of exercises for 30 minutes at least 3 days a week. Do not use any products that contain nicotine or tobacco. These products include cigarettes, chewing tobacco, and vaping devices, such as e-cigarettes. If you need help quitting, ask your health care provider. Monitor your blood pressure at home as told by your health care provider. Keep all follow-up visits. This is important. Medicines Take over-the-counter and prescription medicines only as told by your health care provider. Follow directions carefully. Blood  pressure medicines must be taken as prescribed. Do not skip doses of blood pressure medicine. Doing this puts you at risk for problems and can make the medicine less effective. Ask your health care provider about side effects or reactions to medicines that you should watch for. Contact a health care provider if you: Think you are having a reaction to a medicine you are taking. Have headaches that keep coming back (recurring). Feel dizzy. Have swelling in your ankles. Have trouble with your vision. Get help right away if you: Develop a severe headache or confusion. Have unusual weakness or numbness. Feel faint. Have severe pain in your chest or abdomen. Vomit repeatedly. Have trouble breathing. These symptoms may be an emergency. Get help right away. Call 911. Do not wait to see if the symptoms will go away. Do not drive yourself to the hospital. Summary Hypertension is when the force of blood pumping through your arteries is too strong. If this condition is not controlled, it may put you at risk for serious complications. Your personal target blood pressure may vary depending on your medical conditions, your age, and other factors. For most people, a normal blood pressure is less than 120/80. Hypertension is treated with lifestyle changes, medicines, or a combination of both. Lifestyle changes include losing weight, eating a healthy,  low-sodium diet, exercising more, and limiting alcohol. This information is not intended to replace advice given to you by your health care provider. Make sure you discuss any questions you have with your health care provider. Document Revised: 10/21/2021 Document Reviewed: 10/21/2021 Elsevier Patient Education  2024 [employer].

## 2024-06-26 NOTE — Progress Notes (Addendum)
 Subjective:    Patient ID: Rita Wells, female    DOB: [birth year], 89 y.o.   MRN: [id number]  Chief Complaint  Patient presents with   Medical Management of Chronic Issues   Pt presents to the office today for CPE and chronic follow up.   Quit smoking approx 2012.   Has CKD and saw a nephrologists, but does not want to follow back up.    She has hoarse voice. She had laryngocele, dysphonia and vocal fold polyp. She had a microlaryngoscopy and excision using CO2 laser. Wanted to go back to remove it more, however, patient states she was done with it.   She has lost 16 lbs since 04/12/23. Reports she has not been trying. She is drinking ensures. She has gained two pounds since our last visit.      06/26/2024    2:32 PM 03/06/2024    2:15 PM 01/05/2024    6:48 AM  Last 3 Weights  Weight (lbs) 114 lb 12.8 oz 112 lb 120 lb  Weight (kg) 52.073 kg 50.803 kg 54.432 kg     Hypertension This is a chronic problem. The current episode started more than 1 year ago. The problem has been waxing and waning since onset. The problem is uncontrolled. Associated symptoms include anxiety and malaise/fatigue. Pertinent negatives include no peripheral edema or shortness of breath. Risk factors for coronary artery disease include dyslipidemia and sedentary lifestyle. Past treatments include beta blockers. The current treatment provides moderate improvement. Identifiable causes of hypertension include a thyroid  problem.  Thyroid  Problem Presents for follow-up visit. Symptoms include anxiety, fatigue and hoarse voice. Patient reports no dry skin. The symptoms have been stable.  Insomnia Primary symptoms: sleep disturbance, difficulty falling asleep, malaise/fatigue.   The current episode started more than one year. The onset quality is gradual. The problem occurs intermittently. Past treatments include meditation. The treatment provided moderate relief.  Hyperlipidemia This is a chronic problem. The  current episode started more than 1 year ago. The problem is controlled. Recent lipid tests were reviewed and are normal. Pertinent negatives include no shortness of breath. Current antihyperlipidemic treatment includes statins. The current treatment provides moderate improvement of lipids. Risk factors for coronary artery disease include dyslipidemia, hypertension, a sedentary lifestyle and post-menopausal.  Anxiety Presents for follow-up visit. Symptoms include excessive worry, insomnia, nervous/anxious behavior and restlessness. Patient reports no shortness of breath. Symptoms occur occasionally. The severity of symptoms is mild.        Review of Systems  Constitutional:  Positive for fatigue and malaise/fatigue.  HENT:  Positive for hoarse voice.   Respiratory:  Negative for shortness of breath.   Psychiatric/Behavioral:  Positive for sleep disturbance. The patient is nervous/anxious and has insomnia.   All other systems reviewed and are negative.      Objective:   Physical Exam Vitals reviewed.  Constitutional:      General: She is not in acute distress.    Appearance: She is well-developed and underweight.     Comments: underweight  HENT:     Head: Normocephalic and atraumatic.     Comments: Hoarse voice    Right Ear: Tympanic membrane normal.     Left Ear: Tympanic membrane normal.   Eyes:     Pupils: Pupils are equal, round, and reactive to light.   Neck:     Thyroid : No thyromegaly.   Cardiovascular:     Rate and Rhythm: Normal rate and regular rhythm.     Heart sounds:  Normal heart sounds. No murmur heard. Pulmonary:     Effort: Pulmonary effort is normal. No respiratory distress.     Breath sounds: Normal breath sounds. No wheezing.  Abdominal:     General: Bowel sounds are normal. There is no distension.     Palpations: Abdomen is soft.     Tenderness: There is no abdominal tenderness.   Musculoskeletal:        General: No tenderness. Normal range of  motion.     Cervical back: Normal range of motion and neck supple.   Skin:    General: Skin is warm and dry.   Neurological:     Mental Status: She is alert and oriented to person, place, and time.     Cranial Nerves: No cranial nerve deficit.     Deep Tendon Reflexes: Reflexes are normal and symmetric.   Psychiatric:        Behavior: Behavior normal.        Thought Content: Thought content normal.        Judgment: Judgment normal.       BP (!) 193/106   Pulse 67   Temp 98.2 F (36.8 C) (Temporal)   Ht 5' 3 (1.6 m)   Wt 114 lb 12.8 oz (52.1 kg)   SpO2 99%   BMI 20.34 kg/m      Assessment & Plan:   Rita Wells comes in today with chief complaint of Medical Management of Chronic Issues   Diagnosis and orders addressed:  1. Primary hypertension (Primary) -Start lisinopril  5 mg  -Daily blood pressure log given with instructions on how to fill out and told to bring to next visit -Dash diet information given -Exercise encouraged - Stress Management  -Continue current meds -RTO in 2 weeks  - lisinopril  (ZESTRIL ) 5 MG tablet; Take 1 tablet (5 mg total) by mouth daily.  Dispense: 90 tablet; Refill: 3 - CMP14+EGFR - CBC with Differential/Platelet  2. Other hyperlipidemia - CMP14+EGFR - CBC with Differential/Platelet  3. Insomnia, unspecified type - CMP14+EGFR - CBC with Differential/Platelet  4. Generalized anxiety disorder - CMP14+EGFR - CBC with Differential/Platelet  5. Hypothyroidism, unspecified type  - CMP14+EGFR - CBC with Differential/Platelet  6. Stage 4 chronic kidney disease (HCC) - CMP14+EGFR - CBC with Differential/Platelet  7. Mild protein-calorie malnutrition (HCC) - CMP14+EGFR - CBC with Differential/Platelet  8. Hoarseness of voice - CMP14+EGFR - CBC with Differential/Platelet    Labs pending Continue current medications  Keep nephrologists appointment  High protein diet, encourage ensure milkshakes.   Health  Maintenance reviewed Diet and exercise encouraged  Follow up plan: 2 weeks    Bari Learn, FNP

## 2024-06-27 ENCOUNTER — Ambulatory Visit: Payer: Self-pay | Admitting: Family

## 2024-06-27 LAB — CMP14+EGFR
ALT: 15 IU/L (ref 0–32)
AST: 29 IU/L (ref 0–40)
Albumin: 4.5 g/dL (ref 3.7–4.7)
Alkaline Phosphatase: 102 IU/L (ref 44–121)
BUN/Creatinine Ratio: 16 (ref 12–28)
BUN: 35 mg/dL — AB (ref 8–27)
Bilirubin Total: 0.6 mg/dL (ref 0.0–1.2)
CO2: 23 mmol/L (ref 20–29)
Calcium: 10.2 mg/dL (ref 8.7–10.3)
Chloride: 94 mmol/L — AB (ref 96–106)
Creatinine, Ser: 2.25 mg/dL — AB (ref 0.57–1.00)
Globulin, Total: 2.3 g/dL (ref 1.5–4.5)
Glucose: 70 mg/dL (ref 70–99)
Potassium: 4.4 mmol/L (ref 3.5–5.2)
Sodium: 136 mmol/L (ref 134–144)
Total Protein: 6.8 g/dL (ref 6.0–8.5)
eGFR: 20 mL/min/{1.73_m2} — AB (ref >59)

## 2024-06-27 LAB — CBC WITH DIFFERENTIAL/PLATELET
Basophils Absolute: 0.1 10*3/uL (ref 0.0–0.2)
Basos: 1 %
EOS (ABSOLUTE): 0.5 10*3/uL — ABNORMAL HIGH (ref 0.0–0.4)
Eos: 6 %
Hematocrit: 44.5 % (ref 34.0–46.6)
Hemoglobin: 14.6 g/dL (ref 11.1–15.9)
Immature Grans (Abs): 0 10*3/uL (ref 0.0–0.1)
Immature Granulocytes: 0 %
Lymphocytes Absolute: 1.8 10*3/uL (ref 0.7–3.1)
Lymphs: 19 %
MCH: 30.1 pg (ref 26.6–33.0)
MCHC: 32.8 g/dL (ref 31.5–35.7)
MCV: 92 fL (ref 79–97)
Monocytes Absolute: 0.8 10*3/uL (ref 0.1–0.9)
Monocytes: 8 %
Neutrophils Absolute: 6.4 10*3/uL (ref 1.4–7.0)
Neutrophils: 66 %
Platelets: 195 10*3/uL (ref 150–450)
RBC: 4.85 x10E6/uL (ref 3.77–5.28)
RDW: 14.5 % (ref 11.7–15.4)
WBC: 9.6 10*3/uL (ref 3.4–10.8)

## 2024-07-10 ENCOUNTER — Encounter: Payer: Self-pay | Admitting: Family

## 2024-07-10 ENCOUNTER — Ambulatory Visit (INDEPENDENT_AMBULATORY_CARE_PROVIDER_SITE_OTHER): Admitting: Family

## 2024-07-10 VITALS — BP 124/84 | HR 76 | Temp 97.7°F | Ht 63.0 in | Wt 118.8 lb

## 2024-07-10 DIAGNOSIS — I10 Essential (primary) hypertension: Secondary | ICD-10-CM

## 2024-07-10 NOTE — Progress Notes (Addendum)
   Subjective:    Patient ID: Rita Wells, female    DOB: 1933/09/11, 89 y.o.   MRN: [id number]  Chief Complaint  Patient presents with   Hypertension   PT presents to the office today to recheck HTN. She was seen on 06/26/24 and we started lisinopril  5 mg. Her BP is elevated today, but has had it checked at home and it was 133/78. Hypertension This is a chronic problem. The current episode started more than 1 year ago. The problem has been resolved since onset. The problem is controlled. Associated symptoms include malaise/fatigue and shortness of breath. Pertinent negatives include no peripheral edema. Past treatments include ACE inhibitors. The current treatment provides moderate improvement.      Review of Systems  Constitutional:  Positive for malaise/fatigue.  Respiratory:  Positive for shortness of breath.   All other systems reviewed and are negative.      Objective:   Physical Exam Vitals reviewed.  Constitutional:      General: She is not in acute distress.    Appearance: She is well-developed.  HENT:     Head: Normocephalic and atraumatic.     Comments: Hoarse voice Eyes:     Pupils: Pupils are equal, round, and reactive to light.  Neck:     Thyroid : No thyromegaly.  Cardiovascular:     Rate and Rhythm: Normal rate and regular rhythm.     Heart sounds: Normal heart sounds. No murmur heard. Pulmonary:     Effort: Pulmonary effort is normal. No respiratory distress.     Breath sounds: Normal breath sounds. No wheezing.  Abdominal:     General: Bowel sounds are normal. There is no distension.     Palpations: Abdomen is soft.     Tenderness: There is no abdominal tenderness.  Musculoskeletal:        General: No tenderness. Normal range of motion.     Cervical back: Normal range of motion and neck supple.  Skin:    General: Skin is warm and dry.  Neurological:     Mental Status: She is alert and oriented to person, place, and time.     Cranial Nerves: No  cranial nerve deficit.     Deep Tendon Reflexes: Reflexes are normal and symmetric.  Psychiatric:        Behavior: Behavior normal.        Thought Content: Thought content normal.        Judgment: Judgment normal.      BP 124/84   Pulse 76   Temp 97.7 F (36.5 C) (Temporal)   Ht 5' 3 (1.6 m)   Wt 118 lb 12.8 oz (53.9 kg)   BMI 21.04 kg/m       Assessment & Plan:  Brina Umeda Carchi comes in today with chief complaint of Hypertension   Diagnosis and orders addressed:  1. Primary hypertension (Primary) -At goal on recheck! -Dash diet information given -Exercise encouraged - Stress Management  -Continue current meds -RTO in 3 months  - BMP8+EGFR    Bari Learn, FNP

## 2024-07-10 NOTE — Patient Instructions (Signed)
 Hypertension, Adult High blood pressure (hypertension) is when the force of blood pumping through the arteries is too strong. The arteries are the blood vessels that carry blood from the heart throughout the body. Hypertension forces the heart to work harder to pump blood and may cause arteries to become narrow or stiff. Untreated or uncontrolled hypertension can lead to a heart attack, heart failure, a stroke, kidney disease, and other problems. A blood pressure reading consists of a higher number over a lower number. Ideally, your blood pressure should be below 120/80. The first ("top") number is called the systolic pressure. It is a measure of the pressure in your arteries as your heart beats. The second ("bottom") number is called the diastolic pressure. It is a measure of the pressure in your arteries as the heart relaxes. What are the causes? The exact cause of this condition is not known. There are some conditions that result in high blood pressure. What increases the risk? Certain factors may make you more likely to develop high blood pressure. Some of these risk factors are under your control, including: Smoking. Not getting enough exercise or physical activity. Being overweight. Having too much fat, sugar, calories, or salt (sodium) in your diet. Drinking too much alcohol. Other risk factors include: Having a personal history of heart disease, diabetes, high cholesterol, or kidney disease. Stress. Having a family history of high blood pressure and high cholesterol. Having obstructive sleep apnea. Age. The risk increases with age. What are the signs or symptoms? High blood pressure may not cause symptoms. Very high blood pressure (hypertensive crisis) may cause: Headache. Fast or irregular heartbeats (palpitations). Shortness of breath. Nosebleed. Nausea and vomiting. Vision changes. Severe chest pain, dizziness, and seizures. How is this diagnosed? This condition is diagnosed by  measuring your blood pressure while you are seated, with your arm resting on a flat surface, your legs uncrossed, and your feet flat on the floor. The cuff of the blood pressure monitor will be placed directly against the skin of your upper arm at the level of your heart. Blood pressure should be measured at least twice using the same arm. Certain conditions can cause a difference in blood pressure between your right and left arms. If you have a high blood pressure reading during one visit or you have normal blood pressure with other risk factors, you may be asked to: Return on a different day to have your blood pressure checked again. Monitor your blood pressure at home for 1 week or longer. If you are diagnosed with hypertension, you may have other blood or imaging tests to help your health care provider understand your overall risk for other conditions. How is this treated? This condition is treated by making healthy lifestyle changes, such as eating healthy foods, exercising more, and reducing your alcohol intake. You may be referred for counseling on a healthy diet and physical activity. Your health care provider may prescribe medicine if lifestyle changes are not enough to get your blood pressure under control and if: Your systolic blood pressure is above 130. Your diastolic blood pressure is above 80. Your personal target blood pressure may vary depending on your medical conditions, your age, and other factors. Follow these instructions at home: Eating and drinking  Eat a diet that is high in fiber and potassium, and low in sodium, added sugar, and fat. An example of this eating plan is called the DASH diet. DASH stands for Dietary Approaches to Stop Hypertension. To eat this way: Eat  plenty of fresh fruits and vegetables. Try to fill one half of your plate at each meal with fruits and vegetables. Eat whole grains, such as whole-wheat pasta, brown rice, or whole-grain bread. Fill about one  fourth of your plate with whole grains. Eat or drink low-fat dairy products, such as skim milk or low-fat yogurt. Avoid fatty cuts of meat, processed or cured meats, and poultry with skin. Fill about one fourth of your plate with lean proteins, such as fish, chicken without skin, beans, eggs, or tofu. Avoid pre-made and processed foods. These tend to be higher in sodium, added sugar, and fat. Reduce your daily sodium intake. Many people with hypertension should eat less than 1,500 mg of sodium a day. Do not drink alcohol if: Your health care provider tells you not to drink. You are pregnant, may be pregnant, or are planning to become pregnant. If you drink alcohol: Limit how much you have to: 0-1 drink a day for women. 0-2 drinks a day for men. Know how much alcohol is in your drink. In the [place] one drink equals one 12 oz bottle of beer (355 mL), one 5 oz glass of wine (148 mL), or one 1 oz glass of hard liquor (44 mL). Lifestyle  Work with your health care provider to maintain a healthy body weight or to lose weight. Ask what an ideal weight is for you. Get at least 30 minutes of exercise that causes your heart to beat faster (aerobic exercise) most days of the week. Activities may include walking, swimming, or biking. Include exercise to strengthen your muscles (resistance exercise), such as Pilates or lifting weights, as part of your weekly exercise routine. Try to do these types of exercises for 30 minutes at least 3 days a week. Do not use any products that contain nicotine or tobacco. These products include cigarettes, chewing tobacco, and vaping devices, such as e-cigarettes. If you need help quitting, ask your health care provider. Monitor your blood pressure at home as told by your health care provider. Keep all follow-up visits. This is important. Medicines Take over-the-counter and prescription medicines only as told by your health care provider. Follow directions carefully. Blood  pressure medicines must be taken as prescribed. Do not skip doses of blood pressure medicine. Doing this puts you at risk for problems and can make the medicine less effective. Ask your health care provider about side effects or reactions to medicines that you should watch for. Contact a health care provider if you: Think you are having a reaction to a medicine you are taking. Have headaches that keep coming back (recurring). Feel dizzy. Have swelling in your ankles. Have trouble with your vision. Get help right away if you: Develop a severe headache or confusion. Have unusual weakness or numbness. Feel faint. Have severe pain in your chest or abdomen. Vomit repeatedly. Have trouble breathing. These symptoms may be an emergency. Get help right away. Call 911. Do not wait to see if the symptoms will go away. Do not drive yourself to the hospital. Summary Hypertension is when the force of blood pumping through your arteries is too strong. If this condition is not controlled, it may put you at risk for serious complications. Your personal target blood pressure may vary depending on your medical conditions, your age, and other factors. For most people, a normal blood pressure is less than 120/80. Hypertension is treated with lifestyle changes, medicines, or a combination of both. Lifestyle changes include losing weight, eating a healthy,  low-sodium diet, exercising more, and limiting alcohol. This information is not intended to replace advice given to you by your health care provider. Make sure you discuss any questions you have with your health care provider. Document Revised: 10/21/2021 Document Reviewed: 10/21/2021 Elsevier Patient Education  2024 [employer].

## 2024-07-11 ENCOUNTER — Ambulatory Visit: Payer: Self-pay | Admitting: Family

## 2024-07-11 LAB — BMP8+EGFR
BUN/Creatinine Ratio: 12 (ref 12–28)
BUN: 28 mg/dL — ABNORMAL HIGH (ref 8–27)
CO2: 22 mmol/L (ref 20–29)
Calcium: 10.2 mg/dL (ref 8.7–10.3)
Chloride: 97 mmol/L (ref 96–106)
Creatinine, Ser: 2.3 mg/dL — ABNORMAL HIGH (ref 0.57–1.00)
Glucose: 74 mg/dL (ref 70–99)
Potassium: 4.8 mmol/L (ref 3.5–5.2)
Sodium: 136 mmol/L (ref 134–144)
eGFR: 20 mL/min/1.73 — ABNORMAL LOW (ref >59)

## 2024-09-05 ENCOUNTER — Ambulatory Visit

## 2024-09-06 ENCOUNTER — Other Ambulatory Visit: Payer: Self-pay | Admitting: Family

## 2024-09-06 DIAGNOSIS — E7849 Other hyperlipidemia: Secondary | ICD-10-CM

## 2024-09-22 ENCOUNTER — Ambulatory Visit: Admitting: Family

## 2024-10-03 ENCOUNTER — Other Ambulatory Visit: Payer: Self-pay | Admitting: Family Medicine

## 2024-10-03 DIAGNOSIS — I10 Essential (primary) hypertension: Secondary | ICD-10-CM

## 2024-10-03 MED ORDER — METOPROLOL SUCCINATE ER 100 MG PO TB24: ORAL_TABLET | ORAL | 1 refills | Status: AC

## 2024-10-16 ENCOUNTER — Other Ambulatory Visit

## 2024-10-16 ENCOUNTER — Encounter: Payer: Self-pay | Admitting: Family

## 2024-10-16 ENCOUNTER — Ambulatory Visit (INDEPENDENT_AMBULATORY_CARE_PROVIDER_SITE_OTHER): Admitting: Family

## 2024-10-16 VITALS — BP 135/76 | HR 65 | Temp 97.5°F | Ht 63.0 in | Wt 118.0 lb

## 2024-10-16 DIAGNOSIS — E039 Hypothyroidism, unspecified: Secondary | ICD-10-CM

## 2024-10-16 DIAGNOSIS — J449 Chronic obstructive pulmonary disease, unspecified: Secondary | ICD-10-CM

## 2024-10-16 DIAGNOSIS — Z23 Encounter for immunization: Secondary | ICD-10-CM | POA: Diagnosis not present

## 2024-10-16 DIAGNOSIS — Z0000 Encounter for general adult medical examination without abnormal findings: Secondary | ICD-10-CM

## 2024-10-16 DIAGNOSIS — E441 Mild protein-calorie malnutrition: Secondary | ICD-10-CM | POA: Diagnosis not present

## 2024-10-16 DIAGNOSIS — F411 Generalized anxiety disorder: Secondary | ICD-10-CM

## 2024-10-16 DIAGNOSIS — Z0001 Encounter for general adult medical examination with abnormal findings: Secondary | ICD-10-CM | POA: Diagnosis not present

## 2024-10-16 DIAGNOSIS — R490 Dysphonia: Secondary | ICD-10-CM

## 2024-10-16 DIAGNOSIS — N184 Chronic kidney disease, stage 4 (severe): Secondary | ICD-10-CM

## 2024-10-16 DIAGNOSIS — G4700 Insomnia, unspecified: Secondary | ICD-10-CM

## 2024-10-16 DIAGNOSIS — I10 Essential (primary) hypertension: Secondary | ICD-10-CM | POA: Diagnosis not present

## 2024-10-16 DIAGNOSIS — E7849 Other hyperlipidemia: Secondary | ICD-10-CM

## 2024-10-16 MED ORDER — LEVOTHYROXINE SODIUM 88 MCG PO TABS: 88.0000 ug | ORAL_TABLET | Freq: Every day | ORAL | 2 refills | Status: AC

## 2024-10-16 MED ORDER — ROSUVASTATIN CALCIUM 20 MG PO TABS: 20.0000 mg | ORAL_TABLET | Freq: Every day | ORAL | 0 refills | Status: AC

## 2024-10-16 NOTE — Patient Instructions (Signed)

## 2024-10-16 NOTE — Progress Notes (Signed)
 Subjective:    Patient ID: Rita Wells, female    DOB: 17-Jun-1933, 89 y.o.   MRN: [id number]  Chief Complaint  Patient presents with   Medical Management of Chronic Issues   Pt presents to the office today for CPE and chronic follow up.   She is living by herself.   Quit smoking approx 2012, has COPD and states her breathing is stable.   Has CKD and saw a nephrologists, but does not want to follow back up.    She has hoarse voice. She had laryngocele, dysphonia and vocal fold polyp. She had a microlaryngoscopy and excision using CO2 laser. Wanted to go back to remove it more, however, patient states she was done with it.   She weight is stable at this time.  She is drinking ensures.     10/16/2024    2:21 PM 07/10/2024   11:45 AM 06/26/2024    2:32 PM  Last 3 Weights  Weight (lbs) 118 lb 118 lb 12.8 oz 114 lb 12.8 oz  Weight (kg) 53.524 kg 53.887 kg 52.073 kg     Hypertension This is a chronic problem. The current episode started more than 1 year ago. The problem has been waxing and waning since onset. The problem is uncontrolled. Associated symptoms include anxiety and malaise/fatigue. Pertinent negatives include no peripheral edema or shortness of breath. Risk factors for coronary artery disease include dyslipidemia and sedentary lifestyle. Past treatments include beta blockers. The current treatment provides moderate improvement. Identifiable causes of hypertension include a thyroid  problem.  Thyroid  Problem Presents for follow-up visit. Symptoms include anxiety, dry skin, fatigue and hoarse voice. Patient reports no constipation or diarrhea. The symptoms have been stable.  Insomnia Primary symptoms: sleep disturbance, difficulty falling asleep, malaise/fatigue.   The current episode started more than one year. The onset quality is gradual. The problem occurs intermittently. Past treatments include meditation. The treatment provided moderate relief.  Hyperlipidemia This  is a chronic problem. The current episode started more than 1 year ago. The problem is controlled. Recent lipid tests were reviewed and are normal. Pertinent negatives include no shortness of breath. Current antihyperlipidemic treatment includes statins. The current treatment provides moderate improvement of lipids. Risk factors for coronary artery disease include dyslipidemia, hypertension, a sedentary lifestyle and post-menopausal.  Anxiety Presents for follow-up visit. Symptoms include excessive worry, insomnia, nervous/anxious behavior and restlessness. Patient reports no shortness of breath. Symptoms occur occasionally. The severity of symptoms is mild.        Review of Systems  Constitutional:  Positive for fatigue and malaise/fatigue.  HENT:  Positive for hoarse voice.   Respiratory:  Negative for shortness of breath.   Gastrointestinal:  Negative for constipation and diarrhea.  Psychiatric/Behavioral:  Positive for sleep disturbance. The patient is nervous/anxious and has insomnia.   All other systems reviewed and are negative.  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 04/03/21   Son lives next door, dx with ALS.    Social Drivers of Corporate investment banker Strain: Low Risk  (04/12/2023)   Overall Financial Resource Strain (CARDIA)    Difficulty of Paying Living Expenses: Not hard at all  Food Insecurity: No Food Insecurity (01/05/2024)   Hunger Vital Sign    Worried About Running Out of Food in the Last Year: Never true    Ran Out of Food in the Last Year: Never true  Transportation Needs: No Transportation Needs (01/05/2024)   PRAPARE - Administrator, Civil Service (Medical): No    Lack of Transportation (Non-Medical): No  Physical Activity: Insufficiently Active (04/12/2023)   Exercise Vital Sign    Days of Exercise per Week: 3 days    Minutes of Exercise per Session: 30 min  Stress: No Stress Concern Present (04/12/2023)   [employer] of Occupational Health - Occupational Stress Questionnaire    Feeling of Stress : Not at all  Social Connections: Socially Isolated (01/05/2024)   Social Connection and Isolation Panel    Frequency of Communication with Friends and Family: More than three times a week    Frequency of Social Gatherings with Friends and Family: More than three times a week    Attends Religious Services: Never    Database administrator or Organizations: No    Attends Banker Meetings: Never    Marital Status: Widowed        Objective:   Physical Exam Vitals reviewed.  Constitutional:      General: She is not in acute distress.    Appearance: She is well-developed and underweight.     Comments: underweight  HENT:     Head: Normocephalic and atraumatic.     Comments: Hoarse voice    Right Ear: Tympanic membrane normal.     Left Ear: Tympanic membrane normal.  Eyes:     Pupils:  Pupils are equal, round, and reactive to light.  Neck:     Thyroid : No thyromegaly.  Cardiovascular:     Rate and Rhythm: Normal rate and regular rhythm.     Heart sounds: Normal heart sounds. No murmur heard. Pulmonary:     Effort: Pulmonary effort is normal. No respiratory distress.     Breath sounds: Normal breath sounds. No wheezing.  Abdominal:     General: Bowel sounds are normal. There is no distension.     Palpations: Abdomen is soft.     Tenderness: There is no abdominal tenderness.  Musculoskeletal:        General: No tenderness. Normal range of motion.  Cervical back: Normal range of motion and neck supple.  Skin:    General: Skin is warm and dry.  Neurological:     Mental Status: She is alert and oriented to person, place, and time.     Cranial Nerves: No cranial nerve deficit.     Deep Tendon Reflexes: Reflexes are normal and symmetric.  Psychiatric:        Behavior: Behavior normal.        Thought Content: Thought content normal.        Judgment: Judgment normal.       BP (!) 160/75   Pulse 65   Temp (!) 97.5 F (36.4 C) (Temporal)   Ht 5' 3 (1.6 m)   Wt 118 lb (53.5 kg)   SpO2 97%   BMI 20.90 kg/m      Assessment & Plan:   Jerney Baksh Shawgo comes in today with chief complaint of Medical Management of Chronic Issues   Diagnosis and orders addressed:  1. Other hyperlipidemia - rosuvastatin  (CRESTOR ) 20 MG tablet; Take 1 tablet (20 mg total) by mouth daily.  Dispense: 90 tablet; Refill: 0 - CMP14+EGFR - CBC with Differential/Platelet - Lipid panel  2. Annual physical exam (Primary) - CMP14+EGFR - CBC with Differential/Platelet - Lipid panel - TSH  3. Mild protein-calorie malnutrition - CMP14+EGFR - CBC with Differential/Platelet  4. Insomnia, unspecified type - CMP14+EGFR - CBC with Differential/Platelet  5. Hypothyroidism, unspecified type - levothyroxine  (SYNTHROID ) 88 MCG tablet; Take 1 tablet (88 mcg total) by mouth daily.   Dispense: 90 tablet; Refill: 2 - CMP14+EGFR - CBC with Differential/Platelet - TSH  6. Primary hypertension - CMP14+EGFR - CBC with Differential/Platelet  7. Hoarseness of voice - CMP14+EGFR - CBC with Differential/Platelet  8. Generalized anxiety disorder - CMP14+EGFR - CBC with Differential/Platelet  9. Chronic obstructive pulmonary disease, unspecified COPD type (HCC) - CMP14+EGFR - CBC with Differential/Platelet  10. Stage 4 chronic kidney disease (HCC) - CMP14+EGFR - CBC with Differential/Platelet  11. Encounter for immunization - Flu vaccine HIGH DOSE PF(Fluzone Trivalent)    Labs pending Continue current medications  High protein diet, encourage ensure milkshakes.   Health Maintenance reviewed Diet and exercise encouraged  Follow up plan: 4 months    Bari Learn, FNP

## 2024-10-17 ENCOUNTER — Ambulatory Visit: Payer: Self-pay | Admitting: Family

## 2024-10-17 LAB — CBC WITH DIFFERENTIAL/PLATELET
Basophils Absolute: 0.1 x10E3/uL (ref 0.0–0.2)
Basos: 1 %
EOS (ABSOLUTE): 0.5 x10E3/uL — ABNORMAL HIGH (ref 0.0–0.4)
Eos: 7 %
Hematocrit: 39.9 % (ref 34.0–46.6)
Hemoglobin: 13.6 g/dL (ref 11.1–15.9)
Immature Grans (Abs): 0 x10E3/uL (ref 0.0–0.1)
Immature Granulocytes: 0 %
Lymphocytes Absolute: 1.3 x10E3/uL (ref 0.7–3.1)
Lymphs: 17 %
MCH: 32.2 pg (ref 26.6–33.0)
MCHC: 34.1 g/dL (ref 31.5–35.7)
MCV: 94 fL (ref 79–97)
Monocytes Absolute: 0.5 x10E3/uL (ref 0.1–0.9)
Monocytes: 7 %
Neutrophils Absolute: 5.1 x10E3/uL (ref 1.4–7.0)
Neutrophils: 68 %
Platelets: 194 x10E3/uL (ref 150–450)
RBC: 4.23 x10E6/uL (ref 3.77–5.28)
RDW: 14 % (ref 11.7–15.4)
WBC: 7.4 x10E3/uL (ref 3.4–10.8)

## 2024-10-17 LAB — CMP14+EGFR
ALT: 11 IU/L (ref 0–32)
AST: 20 IU/L (ref 0–40)
Albumin: 4.3 g/dL (ref 3.7–4.7)
Alkaline Phosphatase: 75 IU/L (ref 48–129)
BUN/Creatinine Ratio: 14 (ref 12–28)
BUN: 39 mg/dL — ABNORMAL HIGH (ref 8–27)
Bilirubin Total: 0.4 mg/dL (ref 0.0–1.2)
CO2: 23 mmol/L (ref 20–29)
Calcium: 10.1 mg/dL (ref 8.7–10.3)
Chloride: 97 mmol/L (ref 96–106)
Creatinine, Ser: 2.78 mg/dL — ABNORMAL HIGH (ref 0.57–1.00)
Globulin, Total: 2.3 g/dL (ref 1.5–4.5)
Glucose: 111 mg/dL — ABNORMAL HIGH (ref 70–99)
Potassium: 4.6 mmol/L (ref 3.5–5.2)
Sodium: 136 mmol/L (ref 134–144)
Total Protein: 6.6 g/dL (ref 6.0–8.5)
eGFR: 16 mL/min/1.73 — ABNORMAL LOW (ref >59)

## 2024-10-17 LAB — LIPID PANEL
Chol/HDL Ratio: 2.9 ratio (ref 0.0–4.4)
Cholesterol, Total: 200 mg/dL — ABNORMAL HIGH (ref 100–199)
HDL: 70 mg/dL (ref >39)
LDL Chol Calc (NIH): 100 mg/dL — ABNORMAL HIGH (ref 0–99)
Triglycerides: 175 mg/dL — ABNORMAL HIGH (ref 0–149)
VLDL Cholesterol Cal: 30 mg/dL (ref 5–40)

## 2024-10-17 LAB — TSH: TSH: 87.4 u[IU]/mL — ABNORMAL HIGH (ref 0.450–4.500)

## 2025-01-18 ENCOUNTER — Ambulatory Visit: Payer: Self-pay

## 2025-01-18 NOTE — Telephone Encounter (Signed)
 FYI Only or Action Required?: FYI only for provider: ED advised.  S/w [name] - daughter, she would like a callback form Rita Wells or Rita Wells [phone]. ASAP  Patient was last seen in primary care on 10/16/2024 by Lavell Rita Wells LABOR, FNP.  Called Nurse Triage reporting weakness - unable to walk.  Symptoms began several days ago.  Interventions attempted: Nothing.  Symptoms are: rapidly worsening.  Triage Disposition: Go to ED Now (or PCP Triage)Called [facility] to inform of ed refusal.  Patient/caregiver understands and will follow disposition?: no - wants to talk to [facility]                           Reason for Triage: cant walk - kidney desease   fell backwards. cant walk. kinda needs to be carried. Rita Wells is her doctor.  Reason for Disposition  Patient sounds very sick or weak to the triager  Answer Assessment - Initial Assessment Questions 1. DESCRIPTION: Describe how you are feeling.     Seems very weak - unable 2. SEVERITY: How bad is it?  Can you stand and walk?     Not well 3. ONSET: When did these symptoms begin? (e.g., hours, days, weeks, months)     A few days 4. CAUSE: What do you think is causing the weakness or fatigue? (e.g., not drinking enough fluids, medical problem, trouble sleeping)     unsure 5. NEW MEDICINES:  Have you started on any new medicines recently? (e.g., opioid pain medicines, benzodiazepines, muscle relaxants, antidepressants, antihistamines, neuroleptics, beta blockers)     no 6. OTHER SYMPTOMS: Do you have any other symptoms? (e.g., chest pain, fever, cough, SOB, vomiting, diarrhea, bleeding, other areas of pain)     falls  Protocols used: Weakness (Generalized) and Fatigue-A-AH

## 2025-01-18 NOTE — Telephone Encounter (Signed)
 Patients daughter was advised to take mother to the ER earlier today because she had fell and hit her head

## 2025-01-18 NOTE — Telephone Encounter (Signed)
 Ok to double book for tomorrow. If patient can not walk or having chest pain, or increased SOB she does need to go to ED.

## 2025-02-02 ENCOUNTER — Emergency Department (HOSPITAL_COMMUNITY)

## 2025-02-02 ENCOUNTER — Inpatient Hospital Stay (HOSPITAL_COMMUNITY): Admission: EM | Admit: 2025-02-02 | Source: Home / Self Care | Admitting: Family Medicine

## 2025-02-02 DIAGNOSIS — E785 Hyperlipidemia, unspecified: Secondary | ICD-10-CM | POA: Diagnosis present

## 2025-02-02 DIAGNOSIS — K529 Noninfective gastroenteritis and colitis, unspecified: Principal | ICD-10-CM | POA: Diagnosis present

## 2025-02-02 DIAGNOSIS — E872 Acidosis, unspecified: Secondary | ICD-10-CM | POA: Insufficient documentation

## 2025-02-02 DIAGNOSIS — N179 Acute kidney failure, unspecified: Secondary | ICD-10-CM | POA: Insufficient documentation

## 2025-02-02 DIAGNOSIS — J449 Chronic obstructive pulmonary disease, unspecified: Secondary | ICD-10-CM | POA: Diagnosis present

## 2025-02-02 DIAGNOSIS — E039 Hypothyroidism, unspecified: Secondary | ICD-10-CM | POA: Diagnosis present

## 2025-02-02 DIAGNOSIS — I10 Essential (primary) hypertension: Secondary | ICD-10-CM | POA: Diagnosis present

## 2025-02-02 LAB — RESP PANEL BY RT-PCR (RSV, FLU A&B, COVID)  RVPGX2
Influenza A by PCR: NEGATIVE
Influenza B by PCR: NEGATIVE
Resp Syncytial Virus by PCR: NEGATIVE
SARS Coronavirus 2 by RT PCR: NEGATIVE

## 2025-02-02 LAB — COMPREHENSIVE METABOLIC PANEL WITH GFR
ALT: 13 U/L (ref 0–44)
AST: 33 U/L (ref 15–41)
Albumin: 3.9 g/dL (ref 3.5–5.0)
Alkaline Phosphatase: 85 U/L (ref 38–126)
Anion gap: 20 — ABNORMAL HIGH (ref 5–15)
BUN: 55 mg/dL — ABNORMAL HIGH (ref 8–23)
CO2: 19 mmol/L — ABNORMAL LOW (ref 22–32)
Calcium: 9.5 mg/dL (ref 8.9–10.3)
Chloride: 98 mmol/L (ref 98–111)
Creatinine, Ser: 3.42 mg/dL — ABNORMAL HIGH (ref 0.44–1.00)
GFR, Estimated: 12 mL/min — ABNORMAL LOW
Glucose, Bld: 119 mg/dL — ABNORMAL HIGH (ref 70–99)
Potassium: 5.1 mmol/L (ref 3.5–5.1)
Sodium: 136 mmol/L (ref 135–145)
Total Bilirubin: 0.6 mg/dL (ref 0.0–1.2)
Total Protein: 6.6 g/dL (ref 6.5–8.1)

## 2025-02-02 LAB — CULTURE, BLOOD (ROUTINE X 2)
Special Requests: ADEQUATE
Special Requests: ADEQUATE

## 2025-02-02 LAB — CBC WITH DIFFERENTIAL/PLATELET
Abs Immature Granulocytes: 0.04 10*3/uL (ref 0.00–0.07)
Basophils Absolute: 0 10*3/uL (ref 0.0–0.1)
Basophils Relative: 0 %
Eosinophils Absolute: 0 10*3/uL (ref 0.0–0.5)
Eosinophils Relative: 0 %
HCT: 45.8 % (ref 36.0–46.0)
Hemoglobin: 15.7 g/dL — ABNORMAL HIGH (ref 12.0–15.0)
Immature Granulocytes: 0 %
Lymphocytes Relative: 5 %
Lymphs Abs: 0.8 10*3/uL (ref 0.7–4.0)
MCH: 30.4 pg (ref 26.0–34.0)
MCHC: 34.3 g/dL (ref 30.0–36.0)
MCV: 88.8 fL (ref 80.0–100.0)
Monocytes Absolute: 0.7 10*3/uL (ref 0.1–1.0)
Monocytes Relative: 5 %
Neutro Abs: 13.9 10*3/uL — ABNORMAL HIGH (ref 1.7–7.7)
Neutrophils Relative %: 90 %
Platelets: 211 10*3/uL (ref 150–400)
RBC: 5.16 MIL/uL — ABNORMAL HIGH (ref 3.87–5.11)
RDW: 13.6 % (ref 11.5–15.5)
WBC: 15.5 10*3/uL — ABNORMAL HIGH (ref 4.0–10.5)
nRBC: 0 % (ref 0.0–0.2)

## 2025-02-02 LAB — BASIC METABOLIC PANEL WITH GFR
Anion gap: 15 (ref 5–15)
BUN: 53 mg/dL — ABNORMAL HIGH (ref 8–23)
CO2: 20 mmol/L — ABNORMAL LOW (ref 22–32)
Calcium: 8.6 mg/dL — ABNORMAL LOW (ref 8.9–10.3)
Chloride: 101 mmol/L (ref 98–111)
Creatinine, Ser: 3.56 mg/dL — ABNORMAL HIGH (ref 0.44–1.00)
GFR, Estimated: 12 mL/min — ABNORMAL LOW
Glucose, Bld: 90 mg/dL (ref 70–99)
Potassium: 5 mmol/L (ref 3.5–5.1)
Sodium: 136 mmol/L (ref 135–145)

## 2025-02-02 LAB — TSH: TSH: 75.2 u[IU]/mL — ABNORMAL HIGH (ref 0.350–4.500)

## 2025-02-02 LAB — LIPASE, BLOOD: Lipase: 53 U/L — ABNORMAL HIGH (ref 11–51)

## 2025-02-02 LAB — TROPONIN T, HIGH SENSITIVITY
Troponin T High Sensitivity: 71 ng/L — ABNORMAL HIGH (ref 0–19)
Troponin T High Sensitivity: 77 ng/L — ABNORMAL HIGH (ref 0–19)

## 2025-02-02 LAB — LACTIC ACID, PLASMA: Lactic Acid, Venous: 1.3 mmol/L (ref 0.5–1.9)

## 2025-02-02 LAB — POC OCCULT BLOOD, ED: Fecal Occult Bld: POSITIVE — AB

## 2025-02-02 MED ORDER — PANTOPRAZOLE SODIUM 40 MG IV SOLR
40.0000 mg | Freq: Once | INTRAVENOUS | Status: AC
  Administered 2025-02-02: 40 mg via INTRAVENOUS
  Filled 2025-02-02: qty 10

## 2025-02-02 MED ORDER — PIPERACILLIN-TAZOBACTAM IN DEX 2-0.25 GM/50ML IV SOLN
2.2500 g | Freq: Three times a day (TID) | INTRAVENOUS | Status: AC
  Filled 2025-02-02 (×2): qty 50

## 2025-02-02 MED ORDER — LACTATED RINGERS IV BOLUS
500.0000 mL | Freq: Once | INTRAVENOUS | Status: AC
  Administered 2025-02-02: 500 mL via INTRAVENOUS

## 2025-02-02 MED ORDER — SODIUM CHLORIDE 0.9 % IV BOLUS
1000.0000 mL | Freq: Once | INTRAVENOUS | Status: AC
  Administered 2025-02-02: 1000 mL via INTRAVENOUS

## 2025-02-02 MED ORDER — PIPERACILLIN-TAZOBACTAM 3.375 G IVPB 30 MIN
3.3750 g | Freq: Once | INTRAVENOUS | Status: AC
  Administered 2025-02-02: 3.375 g via INTRAVENOUS
  Filled 2025-02-02: qty 50

## 2025-02-02 MED ORDER — LACTATED RINGERS IV BOLUS
1000.0000 mL | Freq: Once | INTRAVENOUS | Status: AC
  Administered 2025-02-02: 1000 mL via INTRAVENOUS

## 2025-02-02 MED ORDER — LACTATED RINGERS IV SOLN: INTRAVENOUS | Status: AC

## 2025-02-02 NOTE — H&P (Signed)
 " History and Physical    Patient: Rita Wells FMW:[id number] DOB: [birth year] DOA: 02/02/2025 DOS: the patient was seen and examined on 02/02/2025 PCP: Lavell Bari LABOR, FNP  Patient coming from: Home  Chief Complaint:  Chief Complaint  Patient presents with   Fall    Patient states she had a fall and c/o right leg pain.    HPI: Rita Wells is a 89 y.o. female with medical history significant of HTN, hypothyroidism, stage IV chronic kidney disease.  Patient comes in with abdominal pain, blood in stool, diarrhea.  When she woke up this morning and use the bathroom, she noticed blood in the toilet with her stool.  She has never had this before.  She denies new medications, NSAID use, or alcohol use.  No antibiotics recently.  Baseline creatinine is 2.78 but is increased to 3.4 today.  She does have a gap metabolic acidosis.  She reports her diet is normal.  Hemoglobin is 15.7.  She does report rarely taking her levothyroxine .  Back in October, her levothyroxine  was 87.  Today it is 75.  Review of Systems: As mentioned in the history of present illness. All other systems reviewed and are negative. Past Medical History:  Diagnosis Date   Arthritis    Glaucoma    Hyperlipidemia    Hypertension    Toxic goiter    Past Surgical History:  Procedure Laterality Date   CERVICAL FUSION     MICROLARYNGOSCOPY WITH CO2 LASER AND EXCISION OF VOCAL CORD LESION Bilateral 01/05/2024   Procedure: Direct LARYNGOSCOPY WITH CO2 LASER AND EXCISION OF bilateral larygoceles;  Surgeon: Okey Burns, MD;  Location: [facility] OR;  Service: ENT;  Laterality: Bilateral;   RIGID BRONCHOSCOPY N/A 01/05/2024   Procedure: RIGID BRONCHOSCOPY;  Surgeon: Okey Burns, MD;  Location: [facility] OR;  Service: ENT;  Laterality: N/A;   TUBAL LIGATION     Social History:  reports that she quit smoking about 46 years ago. Her smoking use included cigarettes. She has never used smokeless tobacco. She reports that she does not  drink alcohol and does not use drugs.  Allergies[1]  Family History  Problem Relation Age of Onset   Heart disease Mother        MI   Heart attack Mother    CVA Father    Stroke Father    Cancer Sister        OVARIAN   Heart disease Brother        MI   Heart attack Brother    Cancer Brother    Heart disease Brother        MI   Cancer Sister    Heart disease Sister     Prior to Admission medications  Medication Sig Start Date End Date Taking? Authorizing Provider  cetirizine  (ZYRTEC ) 10 MG tablet Take 1 tablet (10 mg total) by mouth daily. 10/19/23   Soldatova, Liuba, MD  feeding supplement (ENSURE ENLIVE / ENSURE PLUS) LIQD Take 237 mLs by mouth 2 (two) times daily between meals. Patient taking differently: Take 237 mLs by mouth 2 (two) times daily as needed (supplement). 06/28/23   Lavell Bari LABOR, FNP  levothyroxine  (SYNTHROID ) 88 MCG tablet Take 1 tablet (88 mcg total) by mouth daily. 10/16/24   Lavell Bari A, FNP  lisinopril  (ZESTRIL ) 5 MG tablet Take 1 tablet (5 mg total) by mouth daily. 06/26/24   Lavell Bari LABOR, FNP  metoprolol  succinate (TOPROL -XL) 100 MG 24 hr tablet TAKE 1 TABLET DAILY  WITH OR IMMEDIATELY AFTER A MEAL 10/03/24   Lavell Lye A, FNP  rosuvastatin  (CRESTOR ) 20 MG tablet Take 1 tablet (20 mg total) by mouth daily. 10/16/24   Lavell Lye LABOR, FNP    Physical Exam: Vitals:   02/02/25 1900 02/02/25 2015 02/02/25 2030 02/02/25 2045  BP: 103/61 (!) 95/55 98/62 (!) 96/58  Pulse: 73 74 75 74  Resp: 16 16 13 13   Temp:      TempSrc:      SpO2: 96% 95% 91% 96%   General: Elderly female. Awake and alert and oriented x3. No acute cardiopulmonary distress.  HEENT: Normocephalic atraumatic.  Right and left ears normal in appearance.  Pupils equal, round, reactive to light. Extraocular muscles are intact. Sclerae anicteric and noninjected.  Moist mucosal membranes. No mucosal lesions.  Neck: Neck supple without lymphadenopathy. No carotid bruits. No masses  palpated.  Cardiovascular: Regular rate with normal S1-S2 sounds. No murmurs, rubs, gallops auscultated. No JVD.  Respiratory: Good respiratory effort with no wheezes, rales, rhonchi. Lungs clear to auscultation bilaterally.  No accessory muscle use. Abdomen: Soft, tenderness on the left side without rebound or guarding, nondistended. Active bowel sounds. No masses or hepatosplenomegaly  Skin: No rashes, lesions, or ulcerations.  Dry, warm to touch. 2+ dorsalis pedis and radial pulses. Musculoskeletal: No calf or leg pain. All major joints not erythematous nontender.  No upper or lower joint deformation.  Good ROM.  No contractures  Psychiatric: Intact judgment and insight. Pleasant and cooperative. Neurologic: No focal neurological deficits. Strength is 5/5 and symmetric in upper and lower extremities.  Cranial nerves II through XII are grossly intact.   Data Reviewed: Labs and imaging reviewed by me  Assessment and Plan: No notes have been filed under this hospital service. Service: Hospitalist  Principal Problem:   Colitis Active Problems:   Hypertension   Hyperlipidemia   Hypothyroidism   Chronic obstructive pulmonary disease (HCC)   Acute kidney injury superimposed on stage 4 chronic kidney disease (HCC)   Metabolic acidosis  Colitis Broad-spectrum antibiotics Stool testing including C. Difficile GI bleed Protonix  Will need outpatient colonoscopy unless bleeding increases Acute kidney injury superimposed on stage IV chronic kidney disease IV fluids Recheck creatinine in the morning Hypothyroidism poor control Discussed importance of thyroid  medication Gapped Metabolic acidosis Patient has already received some IV fluid bolus. Will give another liter Will recheck metabolic panel now and in AM  Hypertension Hold antihypertensives due to low normal BP Hyperlipidemia   Advance Care Planning:   Code Status: Full Code   Consults: GI  Family Communication: daughter  present during interview and exam  Severity of Illness: The appropriate patient status for this patient is INPATIENT. Inpatient status is judged to be reasonable and necessary in order to provide the required intensity of service to ensure the patient's safety. The patient's presenting symptoms, physical exam findings, and initial radiographic and laboratory data in the context of their chronic comorbidities is felt to place them at high risk for further clinical deterioration. Furthermore, it is not anticipated that the patient will be medically stable for discharge from the hospital within 2 midnights of admission.   * I certify that at the point of admission it is my clinical judgment that the patient will require inpatient hospital care spanning beyond 2 midnights from the point of admission due to high intensity of service, high risk for further deterioration and high frequency of surveillance required.*  Author: Felipa Laroche J Acea Yagi, DO 02/02/2025 9:52 PM  For  on call review [email or web address].     [1]  Allergies Allergen Reactions   Azor [Amlodipine-Olmesartan]     Unknown Reaction    "

## 2025-02-02 NOTE — Progress Notes (Signed)
 Patient ID: Rita Wells, female   DOB: Apr 12, 1933, 89 y.o.   MRN: [id number] BP 125/74   Pulse 67   Temp 98.1 F (36.7 C) (Oral)   Resp 16   SpO2 100%  Head CT reviewed. I am unable to identify the abnormality described by the radiologist. The film shows no mass effect, ventricles are not effaced, no lesions identified. No repeat necessary unless neurological exam changes.

## 2025-02-02 NOTE — ED Provider Notes (Incomplete)
 " Rita Wells EMERGENCY DEPARTMENT AT [facility] Provider Note   CSN: [id number] Arrival date & time: 02/02/25  1254     Patient presents with: Fall (Patient states she had a fall and c/o right leg pain. )   Rita Wells is a 89 y.o. female.  {Add pertinent medical, surgical, social history, OB history to HPI:32947} Patient is an 89 year old female who presents to the emergency department secondary to generalized weakness, fall, rectal bleeding.  Symptoms have worsened since yesterday.  Patient does arrive with a skin tear to her right elbow.  She has had no associated chest pain, shortness of breath.  She does admit to pain in her left lower abdomen.  There has been no associated nausea or vomiting.  She is not currently on anticoagulation.  Patient does have a history of dementia and history is limited though she is alert and able to converse without difficulty.   Fall       Prior to Admission medications  Medication Sig Start Date End Date Taking? Authorizing Provider  cetirizine  (ZYRTEC ) 10 MG tablet Take 1 tablet (10 mg total) by mouth daily. 10/19/23   Soldatova, Liuba, MD  feeding supplement (ENSURE ENLIVE / ENSURE PLUS) LIQD Take 237 mLs by mouth 2 (two) times daily between meals. Patient taking differently: Take 237 mLs by mouth 2 (two) times daily as needed (supplement). 06/28/23   Lavell Bari LABOR, FNP  levothyroxine  (SYNTHROID ) 88 MCG tablet Take 1 tablet (88 mcg total) by mouth daily. 10/16/24   Lavell Bari A, FNP  lisinopril  (ZESTRIL ) 5 MG tablet Take 1 tablet (5 mg total) by mouth daily. 06/26/24   Lavell Bari LABOR, FNP  metoprolol  succinate (TOPROL -XL) 100 MG 24 hr tablet TAKE 1 TABLET DAILY WITH OR IMMEDIATELY AFTER A MEAL 10/03/24   Hawks, Christy A, FNP  rosuvastatin  (CRESTOR ) 20 MG tablet Take 1 tablet (20 mg total) by mouth daily. 10/16/24   Lavell Bari LABOR, FNP    Allergies: Azor [amlodipine-olmesartan]    Review of Systems  Neurological:   Positive for weakness.  All other systems reviewed and are negative.   Updated Vital Signs BP 136/79   Pulse 70   Temp 98.1 F (36.7 C) (Oral)   Resp 15   SpO2 100%   Physical Exam Vitals and nursing note reviewed. Exam conducted with a chaperone present.  Constitutional:      Appearance: Normal appearance.  HENT:     Head: Normocephalic and atraumatic.     Nose: Nose normal.     Mouth/Throat:     Mouth: Mucous membranes are moist.  Eyes:     Extraocular Movements: Extraocular movements intact.     Conjunctiva/sclera: Conjunctivae normal.     Pupils: Pupils are equal, round, and reactive to light.  Cardiovascular:     Rate and Rhythm: Normal rate and regular rhythm.     Pulses: Normal pulses.     Heart sounds: Normal heart sounds. No murmur heard.    No gallop.  Pulmonary:     Effort: Pulmonary effort is normal. No respiratory distress.     Breath sounds: Normal breath sounds. No stridor. No wheezing, rhonchi or rales.  Abdominal:     General: Abdomen is flat. Bowel sounds are normal. There is no distension.     Palpations: Abdomen is soft.     Tenderness: There is no guarding.     Comments: Tender to palpation over left lower quadrant  Genitourinary:    Rectum: Guaiac  result positive.     Comments: Excoriations noted around the rectum and vagina, no areas of induration or fluctuance Musculoskeletal:        General: Normal range of motion.     Cervical back: Normal range of motion and neck supple. No rigidity or tenderness.     Comments: Tender to palpation over the right elbow, nontender palpation of remainder bilateral upper and lower extremities, pelvis stable to AP and lateral compression, no obvious deformity, no skin breakdown or ulceration, nontender palpation over thoracic or lumbar spine  Skin:    General: Skin is warm and dry.     Findings: No rash.     Comments: Skin tear noted over the right elbow  Neurological:     General: No focal deficit present.      Mental Status: She is alert. Mental status is at baseline. She is disoriented.  Psychiatric:        Mood and Affect: Mood normal.        Behavior: Behavior normal.        Thought Content: Thought content normal.        Judgment: Judgment normal.     (all labs ordered are listed, but only abnormal results are displayed) Labs Reviewed  POC OCCULT BLOOD, ED - Abnormal; Notable for the following components:      Result Value   Fecal Occult Bld POSITIVE (*)    All other components within normal limits  RESP PANEL BY RT-PCR (RSV, FLU A&B, COVID)  RVPGX2  COMPREHENSIVE METABOLIC PANEL WITH GFR  LIPASE, BLOOD  CBC WITH DIFFERENTIAL/PLATELET  URINALYSIS, ROUTINE W REFLEX MICROSCOPIC  I-STAT CHEM 8, ED  TROPONIN T, HIGH SENSITIVITY    EKG: None  Radiology: No results found.  {Document cardiac monitor, telemetry assessment procedure when appropriate:32947} Procedures   Medications Ordered in the ED  pantoprazole  (PROTONIX ) injection 40 mg (40 mg Intravenous Given 02/02/25 1539)      {Click here for ABCD2, HEART and other calculators REFRESH Note before signing:1}                              Medical Decision Making Amount and/or Complexity of Data Reviewed Labs: ordered. Radiology: ordered.  Risk Prescription drug management.   ***  {Document critical care time when appropriate  Document review of labs and clinical decision tools ie CHADS2VASC2, etc  Document your independent review of radiology images and any outside records  Document your discussion with family members, caretakers and with consultants  Document social determinants of health affecting pt's care  Document your decision making why or why not admission, treatments were needed:32947:::1}   Final diagnoses:  None    ED Discharge Orders     None        "

## 2025-02-02 NOTE — ED Notes (Signed)
 Pt covered in dried stool, from middle of back down the legs, pt cleaned up, changed into gown, brief applied, PA in room to complete rectal exam. POC blood charted positive

## 2025-02-02 NOTE — ED Triage Notes (Signed)
 Grandson stated patient had weakness since yesterday, diarrhea and bloody stools since last night, patient has hx of dementia.

## 2025-02-02 NOTE — ED Notes (Signed)
 Family now at bedside

## 2025-02-02 NOTE — Consult Note (Addendum)
 Sahmya Arai Faizan Mitchel Delduca, M.D. Gastroenterology & Hepatology                                           Patient Name: Rita Wells MRN: [id number] Admission Date: 02/02/2025 Date of Evaluation:  02/02/2025 Time of Evaluation: 6:39 PM  Chief Complaint: Blood in stool and abdominal pain  HPI:  This is a 89 y.o. female with history of hypertension, dementia, hyperlipidemia and hypothyroidism presented with abdominal pain and blood in stool.  GI is consulted for hematochezia and CT finding of colitis  Patient is seen and examined with Dr. Aurea in the room this evening.  History is obtained with the help of the daughter.  Patient reports this morning she woke up and saw blood in the stool and since then had maybe 1-2 more episodes.  Patient also reports abdominal pain on the left side.  Denies taking any NSAIDs or new medications.  Patient reports diarrhea since yesterday  Patient is unsure of any previous colonoscopies but may thing she had one over 20 years ago No family history of colon cancer  Brother had lung cancer and kidney cancer  Labs with creatinine 2.78 up trended to 3.42 Troponin 71 WBC 15 Hemoglobin 15.7 platelet 211 Positive fecal occult blood CT Abdomen pelvis without contrast   IMPRESSION: 1. Moderate wall thickening and submucosal edema throughout the splenic flexure of the colon, extending into the descending and proximal sigmoid colon, which may represent changes of an infectious or inflammatory colitis. Correlation with venous lactate recommended to evaluate for ischemic colitis.  2. Sigmoid diverticulosis without evidence of diverticulitis.  3. Nonobstructive nephrolithiasis in the right kidney. No hydronephrosis.  Past Medical History: SEE CHRONIC ISSSUES: Past Medical History:  Diagnosis Date   Arthritis    Glaucoma    Hyperlipidemia    Hypertension    Toxic goiter    Past Surgical History:  Past Surgical History:  Procedure Laterality Date   CERVICAL FUSION      MICROLARYNGOSCOPY WITH CO2 LASER AND EXCISION OF VOCAL CORD LESION Bilateral 01/05/2024   Procedure: Direct LARYNGOSCOPY WITH CO2 LASER AND EXCISION OF bilateral larygoceles;  Surgeon: Okey Burns, MD;  Location: [facility] OR;  Service: ENT;  Laterality: Bilateral;   RIGID BRONCHOSCOPY N/A 01/05/2024   Procedure: RIGID BRONCHOSCOPY;  Surgeon: Okey Burns, MD;  Location: [facility] OR;  Service: ENT;  Laterality: N/A;   TUBAL LIGATION     Family History:  Family History  Problem Relation Age of Onset   Heart disease Mother        MI   Heart attack Mother    CVA Father    Stroke Father    Cancer Sister        OVARIAN   Heart disease Brother        MI   Heart attack Brother    Cancer Brother    Heart disease Brother        MI   Cancer Sister    Heart disease Sister    Social History: Social History[1]  Home Medications:  Prior to Admission medications  Medication Sig Start Date End Date Taking? Authorizing Provider  cetirizine  (ZYRTEC ) 10 MG tablet Take 1 tablet (10 mg total) by mouth daily. 10/19/23   Soldatova, Liuba, MD  feeding supplement (ENSURE ENLIVE / ENSURE PLUS) LIQD Take 237 mLs by mouth 2 (two) times daily between meals.  Patient taking differently: Take 237 mLs by mouth 2 (two) times daily as needed (supplement). 06/28/23   Lavell Bari LABOR, FNP  levothyroxine  (SYNTHROID ) 88 MCG tablet Take 1 tablet (88 mcg total) by mouth daily. 10/16/24   Lavell Bari A, FNP  lisinopril  (ZESTRIL ) 5 MG tablet Take 1 tablet (5 mg total) by mouth daily. 06/26/24   Lavell Bari LABOR, FNP  metoprolol  succinate (TOPROL -XL) 100 MG 24 hr tablet TAKE 1 TABLET DAILY WITH OR IMMEDIATELY AFTER A MEAL 10/03/24   Hawks, Christy A, FNP  rosuvastatin  (CRESTOR ) 20 MG tablet Take 1 tablet (20 mg total) by mouth daily. 10/16/24   Lavell Bari LABOR, FNP    Inpatient Medications: Current Medications[2] Allergies: Azor [amlodipine-olmesartan]  Complete Review of Systems: GENERAL: negative for malaise, night  sweats HEENT: No changes in hearing or vision, no nose bleeds or other nasal problems. NECK: Negative for lumps, goiter, pain and significant neck swelling RESPIRATORY: Negative for cough, wheezing CARDIOVASCULAR: Negative for chest pain, leg swelling, palpitations, orthopnea GI: SEE HPI MUSCULOSKELETAL: Negative for joint pain or swelling, back pain, and muscle pain. SKIN: Negative for lesions, rash PSYCH: Negative for sleep disturbance, mood disorder and recent psychosocial stressors. HEMATOLOGY Negative for prolonged bleeding, bruising easily, and swollen nodes. ENDOCRINE: Negative for cold or heat intolerance, polyuria, polydipsia and goiter. NEURO: negative for tremor, gait imbalance, syncope and seizures. The remainder of the review of systems is noncontributory.  Physical Exam: BP 125/74   Pulse 67   Temp 98.1 F (36.7 C) (Oral)   Resp 16   SpO2 100%  GENERAL: The patient is AO x2, in no acute distress. HEENT: Head is normocephalic and atraumatic. EOMI are intact. Mouth is well hydrated and without lesions. NECK: Supple. No masses LUNGS: Clear to auscultation. No presence of rhonchi/wheezing/rales. Adequate chest expansion HEART: RRR, normal s1 and s2. ABDOMEN: Soft, mild left upper quadrant tenderness. no guarding, no peritoneal signs, and nondistended. BS +. No masses.  Laboratory Data CBC:     Component Value Date/Time   WBC 15.5 (H) 02/02/2025 1533   RBC 5.16 (H) 02/02/2025 1533   HGB 15.7 (H) 02/02/2025 1533   HGB 13.6 10/16/2024 1446   HCT 45.8 02/02/2025 1533   HCT 39.9 10/16/2024 1446   PLT 211 02/02/2025 1533   PLT 194 10/16/2024 1446   MCV 88.8 02/02/2025 1533   MCV 94 10/16/2024 1446   MCH 30.4 02/02/2025 1533   MCHC 34.3 02/02/2025 1533   RDW 13.6 02/02/2025 1533   RDW 14.0 10/16/2024 1446   LYMPHSABS 0.8 02/02/2025 1533   LYMPHSABS 1.3 10/16/2024 1446   MONOABS 0.7 02/02/2025 1533   EOSABS 0.0 02/02/2025 1533   EOSABS 0.5 (H) 10/16/2024 1446    BASOSABS 0.0 02/02/2025 1533   BASOSABS 0.1 10/16/2024 1446   COAG: No results found for: INR, PROTIME  BMP:     Latest Ref Rng & Units 02/02/2025    3:33 PM 10/16/2024    2:46 PM 07/10/2024   12:23 PM  BMP  Glucose 70 - 99 mg/dL 880  888  74   BUN 8 - 23 mg/dL 55  39  28   Creatinine 0.44 - 1.00 mg/dL 6.57  7.21  7.69   BUN/Creat Ratio 12 - 28  14  12    Sodium 135 - 145 mmol/L 136  136  136   Potassium 3.5 - 5.1 mmol/L 5.1  4.6  4.8   Chloride 98 - 111 mmol/L 98  97  97  CO2 22 - 32 mmol/L 19  23  22    Calcium  8.9 - 10.3 mg/dL 9.5  89.8  89.7     HEPATIC:     Latest Ref Rng & Units 02/02/2025    3:33 PM 10/16/2024    2:46 PM 06/26/2024    3:06 PM  Hepatic Function  Total Protein 6.5 - 8.1 g/dL 6.6  6.6  6.8   Albumin 3.5 - 5.0 g/dL 3.9  4.3  4.5   AST 15 - 41 U/L 33  20  29   ALT 0 - 44 U/L 13  11  15    Alk Phosphatase 38 - 126 U/L 85  75  102   Total Bilirubin 0.0 - 1.2 mg/dL 0.6  0.4  0.6     CARDIAC: No results found for: CKTOTAL, CKMB, CKMBINDEX, TROPONINI   Imaging: I personally reviewed and interpreted the available imaging.  Assessment & Plan:   This is a 89 y.o. female with history of hypertension, dementia, hyperlipidemia and hypothyroidism presented with abdominal pain and blood in stool.  GI is consulted for hematochezia and CT finding of colitis  # Hematochezia/diarrhea # Positive FOBT # CT finding of colitis  This could be infectious, inflammatory or ischemic colitis.  Cannot rule out underlying malignancy  Patient with leukocytosis and recently having diarrhea can be  infectious colitis  Since CT abdomen pelvis was done without contrast due to worsening AKI cannot rule out ischemic colitis  Recs:   -Send stool for C. difficile and GI PCR -Recommend broad-spectrum antibiotics -IV fluids -Follow-up lactate level -Trend hemoglobin and transfuse to keep above 7 -IV PPI twice daily -Patient would benefit from outpatient colonoscopy once  acute inflammation has resolved in 6 weeks, unless worsening GI bleed while inpatient  Discussed with ED attending and patient daughter Aurea  Will continue to follow patient closely  Roseanne Juenger Faizan Helem Reesor, MD Gastroenterology and Hepatology [facility]  This chart has been completed using [facility] Dictation software, and while attempts have been made to ensure accuracy , certain words and phrases may not be transcribed as intended      [1]  Social History Tobacco Use   Smoking status: Former    Current packs/day: 0.00    Types: Cigarettes    Quit date: 12/28/1978    Years since quitting: 46.1   Smokeless tobacco: Never  Vaping Use   Vaping status: Never Used  Substance Use Topics   Alcohol use: No   Drug use: No  [2]  Current Facility-Administered Medications:    lactated ringers  infusion, , Intravenous, Continuous, Rigney, Christopher D, PA-C   piperacillin -tazobactam (ZOSYN ) IVPB 3.375 g, 3.375 g, Intravenous, Once, Daralene Bruckner D, PA-C  Current Outpatient Medications:    cetirizine  (ZYRTEC ) 10 MG tablet, Take 1 tablet (10 mg total) by mouth daily., Disp: 90 tablet, Rfl: 11   feeding supplement (ENSURE ENLIVE / ENSURE PLUS) LIQD, Take 237 mLs by mouth 2 (two) times daily between meals. (Patient taking differently: Take 237 mLs by mouth 2 (two) times daily as needed (supplement).), Disp: 23700 mL, Rfl: 1   levothyroxine  (SYNTHROID ) 88 MCG tablet, Take 1 tablet (88 mcg total) by mouth daily., Disp: 90 tablet, Rfl: 2   lisinopril  (ZESTRIL ) 5 MG tablet, Take 1 tablet (5 mg total) by mouth daily., Disp: 90 tablet, Rfl: 3   metoprolol  succinate (TOPROL -XL) 100 MG 24 hr tablet, TAKE 1 TABLET DAILY WITH OR IMMEDIATELY AFTER A MEAL, Disp: 90 tablet, Rfl: 1  rosuvastatin  (CRESTOR ) 20 MG tablet, Take 1 tablet (20 mg total) by mouth daily., Disp: 90 tablet, Rfl: 0

## 2025-02-16 ENCOUNTER — Ambulatory Visit: Payer: Self-pay | Admitting: Family
